# Patient Record
Sex: Female | Born: 1945 | Race: White | Hispanic: No | Marital: Married | Smoking: Never smoker
Health system: Southern US, Community
[De-identification: ages and names within clinical notes are randomized; demographics above are authoritative.]

## PROBLEM LIST (undated history)

## (undated) DIAGNOSIS — E785 Hyperlipidemia, unspecified: Secondary | ICD-10-CM

## (undated) DIAGNOSIS — M1990 Unspecified osteoarthritis, unspecified site: Secondary | ICD-10-CM

## (undated) DIAGNOSIS — F32A Depression, unspecified: Secondary | ICD-10-CM

## (undated) DIAGNOSIS — I482 Chronic atrial fibrillation, unspecified: Secondary | ICD-10-CM

## (undated) DIAGNOSIS — I340 Nonrheumatic mitral (valve) insufficiency: Secondary | ICD-10-CM

## (undated) DIAGNOSIS — E119 Type 2 diabetes mellitus without complications: Secondary | ICD-10-CM

## (undated) DIAGNOSIS — I10 Essential (primary) hypertension: Secondary | ICD-10-CM

## (undated) DIAGNOSIS — F329 Major depressive disorder, single episode, unspecified: Secondary | ICD-10-CM

## (undated) HISTORY — DX: Type 2 diabetes mellitus without complications: E11.9

## (undated) HISTORY — DX: Chronic atrial fibrillation, unspecified: I48.20

## (undated) HISTORY — PX: TONSILLECTOMY: SUR1361

## (undated) HISTORY — PX: TUBAL LIGATION: SHX77

## (undated) HISTORY — PX: COLONOSCOPY: SHX174

## (undated) HISTORY — DX: Nonrheumatic mitral (valve) insufficiency: I34.0

---

## 2002-07-31 ENCOUNTER — Encounter: Payer: Self-pay | Admitting: Family Medicine

## 2002-07-31 ENCOUNTER — Encounter: Admission: RE | Admit: 2002-07-31 | Discharge: 2002-07-31 | Payer: Self-pay | Admitting: Family Medicine

## 2002-08-03 ENCOUNTER — Encounter: Admission: RE | Admit: 2002-08-03 | Discharge: 2002-08-03 | Payer: Self-pay | Admitting: Family Medicine

## 2002-08-03 ENCOUNTER — Encounter: Payer: Self-pay | Admitting: Family Medicine

## 2004-11-24 ENCOUNTER — Encounter: Admission: RE | Admit: 2004-11-24 | Discharge: 2004-11-24 | Payer: Self-pay | Admitting: Family Medicine

## 2004-12-07 ENCOUNTER — Ambulatory Visit (HOSPITAL_COMMUNITY): Admission: RE | Admit: 2004-12-07 | Discharge: 2004-12-07 | Payer: Self-pay | Admitting: Gastroenterology

## 2009-02-22 ENCOUNTER — Encounter: Admission: RE | Admit: 2009-02-22 | Discharge: 2009-02-22 | Payer: Self-pay | Admitting: Family Medicine

## 2009-12-02 ENCOUNTER — Ambulatory Visit: Payer: Self-pay | Admitting: Cardiology

## 2009-12-02 ENCOUNTER — Inpatient Hospital Stay (HOSPITAL_COMMUNITY): Admission: EM | Admit: 2009-12-02 | Discharge: 2009-12-05 | Payer: Self-pay | Admitting: Emergency Medicine

## 2009-12-03 ENCOUNTER — Encounter (INDEPENDENT_AMBULATORY_CARE_PROVIDER_SITE_OTHER): Payer: Self-pay | Admitting: Internal Medicine

## 2009-12-26 ENCOUNTER — Telehealth (INDEPENDENT_AMBULATORY_CARE_PROVIDER_SITE_OTHER): Payer: Self-pay | Admitting: *Deleted

## 2010-01-25 ENCOUNTER — Ambulatory Visit: Payer: Self-pay | Admitting: Cardiology

## 2010-01-25 DIAGNOSIS — I5022 Chronic systolic (congestive) heart failure: Secondary | ICD-10-CM

## 2010-01-25 DIAGNOSIS — I4891 Unspecified atrial fibrillation: Secondary | ICD-10-CM

## 2010-01-25 DIAGNOSIS — E669 Obesity, unspecified: Secondary | ICD-10-CM

## 2010-02-01 ENCOUNTER — Telehealth (INDEPENDENT_AMBULATORY_CARE_PROVIDER_SITE_OTHER): Payer: Self-pay | Admitting: *Deleted

## 2010-02-02 ENCOUNTER — Ambulatory Visit: Payer: Self-pay | Admitting: Cardiovascular Disease

## 2010-02-02 ENCOUNTER — Ambulatory Visit: Payer: Self-pay

## 2010-02-02 ENCOUNTER — Encounter (HOSPITAL_COMMUNITY): Admission: RE | Admit: 2010-02-02 | Discharge: 2010-02-14 | Payer: Self-pay | Admitting: Cardiology

## 2010-02-02 ENCOUNTER — Encounter: Payer: Self-pay | Admitting: Cardiovascular Disease

## 2010-02-06 ENCOUNTER — Ambulatory Visit: Payer: Self-pay

## 2010-06-27 NOTE — Progress Notes (Signed)
Summary: Nuc Pre-Procedure  Phone Note Outgoing Call Call back at [facility] Phone [phone]   Call placed by: Antionette Char RN,  February 01, 2010 2:48 PM Call placed to: Patient Reason for Call: Confirm/change Appt Summary of Call: Reviewed information on Myoview Information Sheet (see scanned document for further details).  Spoke with patient.     Nuclear Med Background Indications for Stress Test: Evaluation for Ischemia   History: Echo  History Comments: New Onset of Afib 7/11  Symptoms: SOB    Nuclear Pre-Procedure Cardiac Risk Factors: Lipids Height (in): 63

## 2010-06-27 NOTE — Assessment & Plan Note (Signed)
Summary: eph   Visit Type:  Follow-up Primary Provider:  Kathee Delton, PAc  CC:  Atrial Fibrillation.  History of Present Illness: The patient presents for evaluation of atrial fibrillation. She was hospitalized with this recently. She said she saw her primary physician because she hadn't been feeling well. However, she didn't describe specifically palpitations, presyncope or syncope. She was noted to be in atrial fibrillation and was admitted to the hospital. I reviewed these records. She was started on anticoagulation and managed for rate control. She had an echocardiogram which demonstrated some mild pulmonary hypertension, left atrial enlargement and global hypokinesis with an EF of 45%. She was set to see [place] in followup as a new patient.  She is limited in her activities. She has Parkinson's disease and gets around with a walker. With this level of activity she does not feel palpitations, presyncope or syncope. She does not describe chest pressure, neck or arm discomfort. She has some shortness of breath but no PND or orthopnea. She's had no swelling.  Current Medications (verified): 1)  Digoxin 0.25 Mg Tabs (Digoxin) .Marland Kitchen.. 1 By Mouth Daily 2)  Metoprolol Tartrate 25 Mg Tabs (Metoprolol Tartrate) .... 1/2 By Mouth Two Times A Day 3)  Warfarin Sodium 7.5 Mg Tabs (Warfarin Sodium) .... As Directed 4)  Alprazolam 0.25 Mg Tabs (Alprazolam) .... As Needed 5)  Carbidopa-Levodopa 25-100 Mg Tabs (Carbidopa-Levodopa) .Marland Kitchen.. 1 By Mouth Three Times A Day 6)  Fluoxetine Hcl 40 Mg Caps (Fluoxetine Hcl) .Marland Kitchen.. 1 By Mouth Daily 7)  Ropinirole Hcl 2 Mg Tabs (Ropinirole Hcl) .Marland Kitchen.. 1 By Mouth Three Times A Day 8)  Simvastatin 40 Mg Tabs (Simvastatin) .Marland Kitchen.. 1 By Mouth Daily 9)  Trihexyphenidyl .Marland Kitchen.. 1 By Mouth Two Times A Day  Allergies (verified): No Known Drug Allergies  Past History:  Past Medical History: Reviewed history from 01/23/2010 and no changes required.  1. Parkinson's disease.   2. Anxiety  with panic attacks occasionally.   3. Dyslipidemia.   Past Surgical History: Reviewed history from 01/23/2010 and no changes required. None  Family History: No significant family history of heart disease or  stroke or cancer.   Social History: Non-smoker, non-drinker, no drug abuse  Review of Systems       As stated in the HPI and negative for all other systems.   Vital Signs:  Patient profile:   65 year old female Height:      63 inches Weight:      240 pounds BMI:     42.67 Pulse rate:   88 / minute Resp:     18 per minute  Vitals Entered By: Marrion Coy, CNA (January 25, 2010 3:58 PM)  Physical Exam  General:  Well developed, well nourished, in no acute distress. Head:  normocephalic and atraumatic Eyes:  PERRLA/EOM intact; conjunctiva and lids normal. Mouth:  Teeth, gums and palate normal. Oral mucosa normal. Neck:  No jugular venous distention at 90, carotid upstroke brisk and symmetric, no bruits, no thyromegaly Chest Wall:  no deformities or breast masses noted Lungs:  Clear bilaterally to auscultation and percussion. Abdomen:  Positive bowel sounds, no rebound, no guarding, morbidly obese, no organomegaly or midline pulsatile mass appreciated as the patient was seated Msk:  Back normal, normal gait. Muscle strength and tone normal. Extremities:  No clubbing or cyanosis. Neurologic:  Alert and oriented x 3. Skin:  Intact without lesions or rashes. Cervical Nodes:  no significant adenopathy Psych:  Normal affect.   Detailed Cardiovascular Exam  Neck    Carotids: Carotids full and equal bilaterally without bruits.    Heart    Auscultation: S1 and S2 within normal limits, no S3, no clicks, no rubs, no murmurs, PMI not appreciated as the patient was seated  Vascular    Pedal Pulses: normal pedal pulses bilaterally.      Peripheral Circulation: no clubbing, cyanosis, or edema noted with normal capillary refill.     EKG  Procedure date:   01/25/2010  Findings:      Atrial fibrillation, rate 88, rightward axis, no acute ST-T wave changes  Impression & Recommendations:  Problem # 1:  ATRIAL FIBRILLATION WITH RAPID VENTRICULAR RESPONSE (ICD-427.31) She is tolerating anticoagulation. She has is followed by her primary physician. At this point I would like to try to get her in normal rhythm with cardioversion once I document therapeutic Coumadin levels. She will be educated about atrial fibrillation and given information.  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 This may be related to her rhythm. I will check a stress perfusion study. She would not be a walk so she will need a pharmacologic stress. Orders: EKG w/ Interpretation (93000) Nuclear Stress Test (Nuc Stress Test)  Problem # 3:  OBESITY, UNSPECIFIED (ICD-278.00) Weight loss is difficult given her poor mobility. I will continue to discuss calorie restriction.  Patient Instructions: 1)  Your physician recommends that you schedule a follow-up appointment after myoview - we will set up your cardioversion 2)  Your physician recommends that you continue on your current medications as directed. Please refer to the Current Medication list given to you today. 3)  Your physician has requested that you have an adenosine myoview.  For further information please visit [email or web address]  Please follow instruction sheet, as given.

## 2010-06-27 NOTE — Assessment & Plan Note (Signed)
Summary: Cardiology Nuclear Testing  Nuclear Med Background Indications for Stress Test: Evaluation for Ischemia   History: Echo  History Comments: New Onset of Afib 7/11  Symptoms: Chest Pressure, DOE, Palpitations, Rapid HR, SOB  Symptoms Comments: Last Episode of CP- July 2011   Nuclear Pre-Procedure Cardiac Risk Factors: Hypertension, Lipids Caffeine/Decaff Intake: none NPO After: 1:30 PM Lungs: clear IV 0.9% NS with Angio Cath: 22g     IV Site: R Antecubital IV Started by: Bonnita Levan, RN Chest Size (in) 42     Cup Size D     Height (in): 63 Weight (lb): 240 BMI: 42.67  Nuclear Med Study 1 or 2 day study:  2 day     Stress Test Type:  Treadmill/Lexiscan Reading MD:  Olga Millers, MD     Referring MD:  Rollene Rotunda  MD Resting Radionuclide:  Technetium 24m Tetrofosmin     Resting Radionuclide Dose:  33 mCi  Stress Radionuclide:  Technetium 34m Tetrofosmin     Stress Radionuclide Dose:  33 mCi   Stress Protocol Exercise Time (min):  2:00 min     Max HR:  136 bpm     Predicted Max HR:  155 bpm  Max Systolic BP: 148 mm Hg     Percent Max HR:  87.74 %Rate Pressure Product:  96045  Lexiscan: 0.4 mg   Stress Test Technologist:  Bonnita Levan, RN     Nuclear Technologist:  Doyne Keel, CNMT  Rest Procedure  Myocardial perfusion imaging was performed at rest 45 minutes following the intravenous administration of Technetium 40m Tetrofosmin.  Stress Procedure  The patient received IV Lexiscan 0.4 mg over 15-seconds with concurrent low level exercise and then Technetium 34m Tetrofosmin was injected at 30-seconds while the patient continued walking one more minute.  There were no significant changes with Lexiscan. Occ. PVC noted.  Quantitative spect images were obtained after a 45 minute delay.  QPS Raw Data Images:  Normal; no motion artifact; normal heart/lung ratio. Stress Images:  Normal homogeneous uptake in all areas of the myocardium. Rest Images:  Normal  homogeneous uptake in all areas of the myocardium. Subtraction (SDS):  Normal Transient Ischemic Dilatation:  0.76  (Normal <1.22)  Lung/Heart Ratio:  0.33  (Normal <0.45)  Quantitative Gated Spect Images QGS EDV:  87 ml QGS ESV:  31 ml QGS EF:  64 % QGS cine images:  normal  Findings Normal nuclear study      Overall Impression  Exercise Capacity: Lexiscan with no exercise. BP Response: Normal blood pressure response. Clinical Symptoms: No chest pain ECG Impression: Baseline afib with nonspecfic ST/T wave changes Overall Impression: Normal stress nuclear study.  Appended Document: Cardiology Nuclear   No evidence of ischemia.  EF is actually normal.  Appended Document: Cardiology Nuclear Testing pt aware of results

## 2010-06-27 NOTE — Progress Notes (Signed)
Summary: Coumadin follow-up  Phone Note Outgoing Call   Call placed by: Cloyde Reams RN,  December 26, 2009 9:39 AM Call placed to: Patient Summary of Call: Called pt secondary to missed new pt appt on 12/12/09 in [facility].  Pt states she is having her Coumadin monitored at her primary MD, Dr Andrey Campanile at [facility] in [place].   Initial call taken by: Cloyde Reams RN,  December 14, 2009 9:41 AM

## 2010-08-13 LAB — BASIC METABOLIC PANEL
CO2: 24 mEq/L (ref 19–32)
Calcium: 8.5 mg/dL (ref 8.4–10.5)
Calcium: 8.9 mg/dL (ref 8.4–10.5)
Creatinine, Ser: 0.72 mg/dL (ref 0.4–1.2)
Creatinine, Ser: 0.94 mg/dL (ref 0.4–1.2)
GFR calc Af Amer: >60 mL/min (ref >60)
GFR calc Af Amer: >60 mL/min (ref >60)
GFR calc non Af Amer: 60 mL/min — ABNORMAL LOW (ref >60)
GFR calc non Af Amer: >60 mL/min (ref >60)
Sodium: 133 mEq/L — ABNORMAL LOW (ref 135–145)

## 2010-08-13 LAB — CARDIAC PANEL(CRET KIN+CKTOT+MB+TROPI)
CK, MB: 3.3 ng/mL (ref 0.3–4.0)
Total CK: 255 U/L — ABNORMAL HIGH (ref 7–177)

## 2010-08-13 LAB — PROTIME-INR
INR: 1.08 (ref 0.00–1.49)
INR: 1.16 (ref 0.00–1.49)
INR: 1.24 (ref 0.00–1.49)
Prothrombin Time: 13.9 seconds (ref 11.6–15.2)
Prothrombin Time: 14.7 seconds (ref 11.6–15.2)
Prothrombin Time: 16.3 seconds — ABNORMAL HIGH (ref 11.6–15.2)

## 2010-08-13 LAB — POCT I-STAT, CHEM 8
Calcium, Ion: 1.03 mmol/L — ABNORMAL LOW (ref 1.12–1.32)
Glucose, Bld: 125 mg/dL — ABNORMAL HIGH (ref 70–99)
HCT: 43 % (ref 36.0–46.0)
Hemoglobin: 14.6 g/dL (ref 12.0–15.0)
Potassium: 3.3 mEq/L — ABNORMAL LOW (ref 3.5–5.1)

## 2010-08-13 LAB — DIFFERENTIAL
Eosinophils Absolute: 0 10*3/uL (ref 0.0–0.7)
Lymphocytes Relative: 17 % (ref 12–46)
Lymphs Abs: 1.5 10*3/uL (ref 0.7–4.0)
Neutrophils Relative %: 76 % (ref 43–77)

## 2010-08-13 LAB — CBC
HCT: 39.6 % (ref 36.0–46.0)
MCH: 32.3 pg (ref 26.0–34.0)
MCV: 97.1 fL (ref 78.0–100.0)
Platelets: 217 10*3/uL (ref 150–400)
Platelets: 226 10*3/uL (ref 150–400)
RBC: 4.08 MIL/uL (ref 3.87–5.11)
RBC: 4.3 MIL/uL (ref 3.87–5.11)
WBC: 7.8 10*3/uL (ref 4.0–10.5)
WBC: 8.9 10*3/uL (ref 4.0–10.5)

## 2010-08-13 LAB — DIGOXIN LEVEL: Digoxin Level: 0.3 ng/mL — ABNORMAL LOW (ref 0.8–2.0)

## 2010-08-13 LAB — MAGNESIUM: Magnesium: 2.2 mg/dL (ref 1.5–2.5)

## 2010-08-13 LAB — LIPID PANEL
Total CHOL/HDL Ratio: 2.7 RATIO
VLDL: 16 mg/dL (ref 0–40)

## 2010-08-13 LAB — URINALYSIS, ROUTINE W REFLEX MICROSCOPIC
Glucose, UA: NEGATIVE mg/dL
Hgb urine dipstick: NEGATIVE
Specific Gravity, Urine: 1.006 (ref 1.005–1.030)
Urobilinogen, UA: 0.2 mg/dL (ref 0.0–1.0)

## 2010-08-13 LAB — HEMOGLOBIN A1C
Hgb A1c MFr Bld: 5.8 % — ABNORMAL HIGH (ref <5.7)
Mean Plasma Glucose: 120 mg/dL — ABNORMAL HIGH (ref <117)

## 2010-08-13 LAB — TROPONIN I: Troponin I: 0.01 ng/mL (ref 0.00–0.06)

## 2010-08-13 LAB — CK TOTAL AND CKMB (NOT AT ARMC): Relative Index: 1.2 (ref 0.0–2.5)

## 2010-08-13 LAB — POCT CARDIAC MARKERS: CKMB, poc: 1.6 ng/mL (ref 1.0–8.0)

## 2011-08-13 ENCOUNTER — Emergency Department (HOSPITAL_COMMUNITY): Payer: Medicare Other

## 2011-08-13 ENCOUNTER — Other Ambulatory Visit: Payer: Self-pay

## 2011-08-13 ENCOUNTER — Inpatient Hospital Stay (HOSPITAL_COMMUNITY)
Admission: AD | Admit: 2011-08-13 | Discharge: 2011-08-16 | DRG: 690 | Disposition: A | Discharge status: Home Health | Payer: Medicare Other | Attending: Family Medicine | Admitting: Family Medicine

## 2011-08-13 ENCOUNTER — Encounter (HOSPITAL_COMMUNITY): Payer: Self-pay | Admitting: Internal Medicine

## 2011-08-13 DIAGNOSIS — F329 Major depressive disorder, single episode, unspecified: Secondary | ICD-10-CM | POA: Diagnosis present

## 2011-08-13 DIAGNOSIS — F3289 Other specified depressive episodes: Secondary | ICD-10-CM | POA: Diagnosis present

## 2011-08-13 DIAGNOSIS — G20A1 Parkinson's disease without dyskinesia, without mention of fluctuations: Secondary | ICD-10-CM

## 2011-08-13 DIAGNOSIS — R5381 Other malaise: Secondary | ICD-10-CM | POA: Diagnosis present

## 2011-08-13 DIAGNOSIS — R531 Weakness: Secondary | ICD-10-CM

## 2011-08-13 DIAGNOSIS — R32 Unspecified urinary incontinence: Secondary | ICD-10-CM | POA: Diagnosis present

## 2011-08-13 DIAGNOSIS — I482 Chronic atrial fibrillation, unspecified: Secondary | ICD-10-CM

## 2011-08-13 DIAGNOSIS — G20 Parkinson's disease: Secondary | ICD-10-CM

## 2011-08-13 DIAGNOSIS — E876 Hypokalemia: Secondary | ICD-10-CM | POA: Diagnosis present

## 2011-08-13 DIAGNOSIS — I4891 Unspecified atrial fibrillation: Secondary | ICD-10-CM | POA: Diagnosis present

## 2011-08-13 DIAGNOSIS — N390 Urinary tract infection, site not specified: Principal | ICD-10-CM

## 2011-08-13 DIAGNOSIS — D6832 Hemorrhagic disorder due to extrinsic circulating anticoagulants: Secondary | ICD-10-CM

## 2011-08-13 HISTORY — DX: Essential (primary) hypertension: I10

## 2011-08-13 HISTORY — DX: Hyperlipidemia, unspecified: E78.5

## 2011-08-13 HISTORY — DX: Unspecified osteoarthritis, unspecified site: M19.90

## 2011-08-13 LAB — URINALYSIS, ROUTINE W REFLEX MICROSCOPIC
Nitrite: POSITIVE — AB
Specific Gravity, Urine: 1.029 (ref 1.005–1.030)
Urobilinogen, UA: 1 mg/dL (ref 0.0–1.0)

## 2011-08-13 LAB — DIFFERENTIAL
Eosinophils Absolute: 0.1 10*3/uL (ref 0.0–0.7)
Eosinophils Relative: 1 % (ref 0–5)
Lymphocytes Relative: 24 % (ref 12–46)
Lymphs Abs: 2.2 10*3/uL (ref 0.7–4.0)
Monocytes Absolute: 0.9 10*3/uL (ref 0.1–1.0)
Monocytes Relative: 9 % (ref 3–12)

## 2011-08-13 LAB — CBC
HCT: 38 % (ref 36.0–46.0)
Hemoglobin: 13 g/dL (ref 12.0–15.0)
MCH: 31.3 pg (ref 26.0–34.0)
MCV: 91.6 fL (ref 78.0–100.0)
RBC: 4.15 MIL/uL (ref 3.87–5.11)
WBC: 9.2 10*3/uL (ref 4.0–10.5)

## 2011-08-13 LAB — URINE MICROSCOPIC-ADD ON

## 2011-08-13 LAB — COMPREHENSIVE METABOLIC PANEL
ALT: 16 U/L (ref 0–35)
BUN: 20 mg/dL (ref 6–23)
CO2: 24 mEq/L (ref 19–32)
Calcium: 9.6 mg/dL (ref 8.4–10.5)
GFR calc Af Amer: >90 mL/min (ref >90)
GFR calc non Af Amer: 85 mL/min — ABNORMAL LOW (ref >90)
Glucose, Bld: 100 mg/dL — ABNORMAL HIGH (ref 70–99)
Total Protein: 7.1 g/dL (ref 6.0–8.3)

## 2011-08-13 LAB — APTT: aPTT: 47 seconds — ABNORMAL HIGH (ref 24–37)

## 2011-08-13 LAB — PROTIME-INR: INR: 3.49 — ABNORMAL HIGH (ref 0.00–1.49)

## 2011-08-13 MED ORDER — METOPROLOL TARTRATE 25 MG PO TABS
12.5000 mg | ORAL_TABLET | Freq: Once | ORAL | Status: AC
  Administered 2011-08-13: 12.5 mg via ORAL

## 2011-08-13 MED ORDER — FLUOXETINE HCL 20 MG PO CAPS
40.0000 mg | ORAL_CAPSULE | Freq: Every day | ORAL | Status: DC
  Administered 2011-08-14 – 2011-08-16 (×3): 40 mg via ORAL
  Filled 2011-08-13 (×3): qty 2

## 2011-08-13 MED ORDER — DEXTROSE 5 % IV SOLN
1.0000 g | INTRAVENOUS | Status: DC
  Administered 2011-08-14 – 2011-08-15 (×3): 1 g via INTRAVENOUS
  Filled 2011-08-13 (×4): qty 10

## 2011-08-13 MED ORDER — POTASSIUM CHLORIDE CRYS ER 20 MEQ PO TBCR
20.0000 meq | EXTENDED_RELEASE_TABLET | Freq: Once | ORAL | Status: AC
  Administered 2011-08-13: 20 meq via ORAL
  Filled 2011-08-13: qty 1

## 2011-08-13 MED ORDER — SODIUM CHLORIDE 0.9 % IJ SOLN
3.0000 mL | INTRAMUSCULAR | Status: DC | PRN
  Administered 2011-08-16: 3 mL via INTRAVENOUS

## 2011-08-13 MED ORDER — ROPINIROLE HCL 1 MG PO TABS
2.0000 mg | ORAL_TABLET | Freq: Every day | ORAL | Status: DC
  Administered 2011-08-14 – 2011-08-15 (×2): 2 mg via ORAL
  Filled 2011-08-13 (×3): qty 2

## 2011-08-13 MED ORDER — SIMVASTATIN 40 MG PO TABS
40.0000 mg | ORAL_TABLET | Freq: Every day | ORAL | Status: DC
  Administered 2011-08-14 – 2011-08-15 (×2): 40 mg via ORAL
  Filled 2011-08-13 (×3): qty 1

## 2011-08-13 MED ORDER — SODIUM CHLORIDE 0.9 % IV SOLN: 250.0000 mL | INTRAVENOUS | Status: DC | PRN

## 2011-08-13 MED ORDER — SODIUM CHLORIDE 0.9 % IV SOLN
INTRAVENOUS | Status: DC
  Administered 2011-08-14 – 2011-08-15 (×3): via INTRAVENOUS

## 2011-08-13 MED ORDER — DEXTROSE 5 % IV SOLN
1.0000 g | Freq: Once | INTRAVENOUS | Status: AC
  Administered 2011-08-13: 1 g via INTRAVENOUS
  Filled 2011-08-13: qty 10

## 2011-08-13 MED ORDER — METOPROLOL TARTRATE 12.5 MG HALF TABLET
12.5000 mg | ORAL_TABLET | Freq: Two times a day (BID) | ORAL | Status: DC
  Administered 2011-08-14 – 2011-08-16 (×4): 12.5 mg via ORAL
  Filled 2011-08-13 (×7): qty 1

## 2011-08-13 MED ORDER — SODIUM CHLORIDE 0.9 % IJ SOLN
3.0000 mL | Freq: Two times a day (BID) | INTRAMUSCULAR | Status: DC
  Administered 2011-08-15: 3 mL via INTRAVENOUS

## 2011-08-13 MED ORDER — DIGOXIN 250 MCG PO TABS
250.0000 ug | ORAL_TABLET | Freq: Every day | ORAL | Status: DC
  Administered 2011-08-14 – 2011-08-15 (×2): 250 ug via ORAL
  Filled 2011-08-13 (×4): qty 1

## 2011-08-13 MED ORDER — ACETAMINOPHEN 500 MG PO TABS
1000.0000 mg | ORAL_TABLET | Freq: Once | ORAL | Status: AC
  Administered 2011-08-13: 1000 mg via ORAL
  Filled 2011-08-13: qty 2

## 2011-08-13 MED ORDER — DIGOXIN 125 MCG PO TABS
0.1250 mg | ORAL_TABLET | Freq: Once | ORAL | Status: AC
  Administered 2011-08-14: 0.125 mg via ORAL
  Filled 2011-08-13 (×2): qty 1

## 2011-08-13 MED ORDER — CARBIDOPA-LEVODOPA 25-100 MG PO TABS
1.0000 | ORAL_TABLET | Freq: Three times a day (TID) | ORAL | Status: DC
  Administered 2011-08-14 – 2011-08-16 (×7): 1 via ORAL
  Filled 2011-08-13 (×12): qty 1

## 2011-08-13 MED ORDER — ALPRAZOLAM 0.25 MG PO TABS: 0.2500 mg | ORAL_TABLET | Freq: Three times a day (TID) | ORAL | Status: DC | PRN

## 2011-08-13 MED ORDER — SODIUM CHLORIDE 0.9 % IJ SOLN
3.0000 mL | Freq: Two times a day (BID) | INTRAMUSCULAR | Status: DC
  Administered 2011-08-14: 3 mL via INTRAVENOUS

## 2011-08-13 MED ORDER — TRIHEXYPHENIDYL HCL 2 MG PO TABS
2.0000 mg | ORAL_TABLET | Freq: Two times a day (BID) | ORAL | Status: DC
  Administered 2011-08-14 – 2011-08-16 (×5): 2 mg via ORAL
  Filled 2011-08-13 (×6): qty 1

## 2011-08-13 MED ORDER — FESOTERODINE FUMARATE ER 8 MG PO TB24
8.0000 mg | ORAL_TABLET | Freq: Every day | ORAL | Status: DC
Start: 2011-08-14 — End: 2011-08-16
  Administered 2011-08-14 – 2011-08-16 (×3): 8 mg via ORAL
  Filled 2011-08-13 (×3): qty 1

## 2011-08-13 MED ORDER — CARBIDOPA-LEVODOPA 25-100 MG PO TABS
1.0000 | ORAL_TABLET | Freq: Once | ORAL | Status: AC
  Administered 2011-08-14: 1 via ORAL
  Filled 2011-08-13 (×2): qty 1

## 2011-08-13 NOTE — ED Notes (Signed)
Admitting MD at bedside.

## 2011-08-13 NOTE — Progress Notes (Signed)
ANTICOAGULATION CONSULT NOTE - Initial Consult  Pharmacy Consult for Warfarin Indication: atrial fibrillation  No Known Allergies  Patient Measurements: Ht:     Wt:     Vital Signs: Temp: 100.3 F (37.9 C) (03/18 2018) Temp src: Oral (03/18 2018) BP: 127/78 mmHg (03/18 2018) Pulse Rate: 107  (03/18 2018)  Labs:  [facility] 08/13/11 1832  HGB 13.0  HCT 38.0  PLT 259  APTT 47*  LABPROT 35.6*  INR 3.49*  HEPARINUNFRC --  CREATININE 0.78  CKTOTAL --  CKMB --  TROPONINI --   The CrCl is unknown because both a height and weight (above a minimum accepted value) are required for this calculation.  Medical History: Past Medical History  Diagnosis Date  . Parkinson's disease     dx circa 1990 by Dr. Thad Ranger  . Hyperlipidemia     dx circa 2008  . Hypertension     dx circa 2008  . Osteoarthritis     L knee primarily    Medications:  Scheduled:    . acetaminophen  1,000 mg Oral Once  . carbidopa-levodopa  1 tablet Oral TID  . carbidopa-levodopa  1 tablet Oral Once  . cefTRIAXone (ROCEPHIN)  IV  1 g Intravenous Once  . cefTRIAXone (ROCEPHIN)  IV  1 g Intravenous Q24H  . digoxin  0.125 mg Oral Once  . digoxin  250 mcg Oral Daily  . Fesoterodine Fumarate  8 mg Oral Daily  . FLUoxetine  40 mg Oral Daily  . metoprolol tartrate  12.5 mg Oral BID  . metoprolol tartrate  12.5 mg Oral Once  . potassium chloride  20 mEq Oral Once  . rOPINIRole  2 mg Oral QHS  . simvastatin  40 mg Oral QPC supper  . sodium chloride  3 mL Intravenous Q12H  . sodium chloride  3 mL Intravenous Q12H  . trihexyphenidyl  2 mg Oral BID WC   Infusions:    . sodium chloride      Assessment: 66 yo female admitted with UTI- pt on chronic coumadin for A-fib  Goal of Therapy:  INR 2-3   Plan:   No Coumadin tonight.  INR = 3.49  Daily PT/INR  Education  Lorenza Evangelist 08/13/2011,11:52 PM

## 2011-08-13 NOTE — ED Provider Notes (Cosign Needed)
History     CSN: [id number]  Arrival date & time 08/13/11  1512   First MD Initiated Contact with Patient 08/13/11 1558      Chief Complaint  Patient presents with  . Weakness    (Consider location/radiation/quality/duration/timing/severity/associated sxs/prior treatment) HPI  Patient relates she ran out of her levodopa for her Parkinson's disease about 2 weeks ago. She relates her neurologist moved and she has an appointment with her new neurologist Dr. Anne Hahn on March 27. She relates "I don't feel good" patient has extreme difficulty relating to me what that means. She has nausea without vomiting. She states she feels shaky. She relates that got worse last week. She states she doesn't feel like doing anything and she is sleeping a lot. She states she has dizziness and dry mouth and relates that to her medications but she's been off her medicines for a long time. She denies diarrhea but states her stools are normally soft. She has been having urinary and rectal incontinence for several months and has seen Dr. Brunilda Payor twice. Her next appointment with him is April 8. She has been on Tovias 4 mg for a month and is now on 8 mg for total of 3 months of tx on 4/8. She also denies cough, sore throat, rhinorrhea, headache, chest pain, abdominal pain. She states she feels weak in her legs.  PCP Karmen Stabs PA at [facility] under Dr. Benedetto Goad Neurologist Dr. Anne Hahn  No past medical history on file. Parkinson's High cholesterol Anxiety Atrial fibrillation  No past surgical history on file.  No family history on file.  History  Substance Use Topics  . Smoking status: No  . Smokeless tobacco: Not on file  . Alcohol Use: No   lives with husband and Normally walks unassisted  OB History    No data available      Review of Systems  All other systems reviewed and are negative.    Allergies  Review of patient's allergies indicates no known allergies.  Home  Medications   Current Outpatient Rx  Name Route Sig Dispense Refill  . ALPRAZOLAM 0.25 MG PO TABS Oral Take 0.25 mg by mouth 3 (three) times daily as needed. For anxiety    . CARBIDOPA-LEVODOPA 25-100 MG PO TABS Oral Take 1 tablet by mouth 3 (three) times daily.    Marland Kitchen DIGOXIN 0.25 MG PO TABS Oral Take 250 mcg by mouth daily.    . FESOTERODINE FUMARATE ER 8 MG PO TB24 Oral Take 8 mg by mouth daily.    Marland Kitchen FLUOXETINE HCL 40 MG PO CAPS Oral Take 40 mg by mouth daily.    Marland Kitchen METOPROLOL TARTRATE 25 MG PO TABS Oral Take 12.5 mg by mouth 2 (two) times daily.    . OXYBUTYNIN CHLORIDE ER 5 MG PO TB24 Oral Take 5 mg by mouth daily.    Marland Kitchen ROPINIROLE HCL 2 MG PO TABS Oral Take 2 mg by mouth at bedtime.    Marland Kitchen SIMVASTATIN 40 MG PO TABS Oral Take 40 mg by mouth every evening.    . TRIHEXYPHENIDYL HCL 2 MG PO TABS Oral Take 2 mg by mouth 2 (two) times daily with a meal.    . WARFARIN SODIUM 5 MG PO TABS Oral Take 5-7.5 mg by mouth daily. Take 1 (5 mg) tablet by mouth on Monday, Wednesday,and Friday and 7.5 mg on remaining days of week      BP 158/47  Pulse 112  Temp(Src) 99.1 F (37.3 C) (  Oral)  Resp 25  SpO2 98%  Vital signs normal tachycardia, low grade fever   Physical Exam  Nursing note and vitals reviewed. Constitutional: She is oriented to person, place, and time. She appears well-developed and well-nourished.  Non-toxic appearance. She does not appear ill. No distress.  HENT:  Head: Normocephalic and atraumatic.  Right Ear: External ear normal.  Left Ear: External ear normal.  Nose: Nose normal. No mucosal edema or rhinorrhea.  Mouth/Throat: Mucous membranes are normal. No dental abscesses or uvula swelling.       Tongue dry  Eyes: Conjunctivae and EOM are normal. Pupils are equal, round, and reactive to light.  Neck: Normal range of motion and full passive range of motion without pain. Neck supple.  Cardiovascular: Normal rate, regular rhythm and normal heart sounds.  Exam reveals no gallop  and no friction rub.   No murmur heard. Pulmonary/Chest: Effort normal and breath sounds normal. No respiratory distress. She has no wheezes. She has no rhonchi. She has no rales. She exhibits no tenderness and no crepitus.  Abdominal: Soft. Normal appearance and bowel sounds are normal. She exhibits no distension. There is no tenderness. There is no rebound and no guarding.  Musculoskeletal: Normal range of motion. She exhibits no edema and no tenderness.       Moves all extremities well. Patient's noted to have tremor at times. She has some mild diffuse redness and scaliness of her lower legs. She denies any pain.  Neurological: She is alert and oriented to person, place, and time. She has normal strength. No cranial nerve deficit.  Skin: Skin is warm, dry and intact. No rash noted. No erythema. No pallor.       Patient feels very warm to touch all over.  Psychiatric: She has a normal mood and affect. Her speech is normal and behavior is normal. Her mood appears not anxious.    ED Course  Procedures (including critical care time)  Pt attempted to walk, had difficulty standing up with walker. Pt developed low grade fever while in the ED.   Medications  cefTRIAXone (ROCEPHIN) 1 g in dextrose 5 % 50 mL IVPB (1 g Intravenous Given 08/13/11 2118)  acetaminophen (TYLENOL) tablet 1,000 mg (1000 mg Oral Given 08/13/11 2118)   22:12 Dr Selena Batten, admit to tele, team 5 Dr Sunnie Nielsen   Results for orders placed during the hospital encounter of 08/13/11  URINALYSIS, ROUTINE W REFLEX MICROSCOPIC      Component Value Range   Color, Urine AMBER (*) YELLOW    APPearance TURBID (*) CLEAR    Specific Gravity, Urine 1.029  1.005 - 1.030    pH 5.5  5.0 - 8.0    Glucose, UA NEGATIVE  NEGATIVE (mg/dL)   Hgb urine dipstick LARGE (*) NEGATIVE    Bilirubin Urine SMALL (*) NEGATIVE    Ketones, ur TRACE (*) NEGATIVE (mg/dL)   Protein, ur 30 (*) NEGATIVE (mg/dL)   Urobilinogen, UA 1.0  0.0 - 1.0 (mg/dL)   Nitrite  POSITIVE (*) NEGATIVE    Leukocytes, UA LARGE (*) NEGATIVE   CBC      Component Value Range   WBC 9.2  4.0 - 10.5 (K/uL)   RBC 4.15  3.87 - 5.11 (MIL/uL)   Hemoglobin 13.0  12.0 - 15.0 (g/dL)   HCT 16.1  09.6 - 04.5 (%)   MCV 91.6  78.0 - 100.0 (fL)   MCH 31.3  26.0 - 34.0 (pg)   MCHC 34.2  30.0 - 36.0 (  g/dL)   RDW 19.1  47.8 - 29.5 (%)   Platelets 259  150 - 400 (K/uL)  DIFFERENTIAL      Component Value Range   Neutrophils Relative 66  43 - 77 (%)   Neutro Abs 6.1  1.7 - 7.7 (K/uL)   Lymphocytes Relative 24  12 - 46 (%)   Lymphs Abs 2.2  0.7 - 4.0 (K/uL)   Monocytes Relative 9  3 - 12 (%)   Monocytes Absolute 0.9  0.1 - 1.0 (K/uL)   Eosinophils Relative 1  0 - 5 (%)   Eosinophils Absolute 0.1  0.0 - 0.7 (K/uL)   Basophils Relative 0  0 - 1 (%)   Basophils Absolute 0.0  0.0 - 0.1 (K/uL)  COMPREHENSIVE METABOLIC PANEL      Component Value Range   Sodium 136  135 - 145 (mEq/L)   Potassium 3.4 (*) 3.5 - 5.1 (mEq/L)   Chloride 99  96 - 112 (mEq/L)   CO2 24  19 - 32 (mEq/L)   Glucose, Bld 100 (*) 70 - 99 (mg/dL)   BUN 20  6 - 23 (mg/dL)   Creatinine, Ser 6.21  0.50 - 1.10 (mg/dL)   Calcium 9.6  8.4 - 30.8 (mg/dL)   Total Protein 7.1  6.0 - 8.3 (g/dL)   Albumin 3.6  3.5 - 5.2 (g/dL)   AST 23  0 - 37 (U/L)   ALT 16  0 - 35 (U/L)   Alkaline Phosphatase 67  39 - 117 (U/L)   Total Bilirubin 0.9  0.3 - 1.2 (mg/dL)   GFR calc non Af Amer 85 (*) >90 (mL/min)   GFR calc Af Amer >90  >90 (mL/min)  APTT      Component Value Range   aPTT 47 (*) 24 - 37 (seconds)  PROTIME-INR      Component Value Range   Prothrombin Time 35.6 (*) 11.6 - 15.2 (seconds)   INR 3.49 (*) 0.00 - 1.49   URINE MICROSCOPIC-ADD ON      Component Value Range   WBC, UA TOO NUMEROUS TO COUNT  <3 (WBC/hpf)   RBC / HPF 3-6  <3 (RBC/hpf)   Bacteria, UA MANY (*) RARE    Urine-Other MUCOUS PRESENT     Laboratory interpretation all normal except mild hypokalemia, over therapeutic Coumadin, prominent UTI   Date:  08/13/2011  Rate: 116  Rhythm: atrial fibrillation  QRS Axis: normal  Intervals: QT prolonged  ST/T Wave abnormalities: normal  Conduction Disutrbances:none  Narrative Interpretation:   Old EKG Reviewed: changes noted from 12/03/2009 rate faster     1. Weakness generalized   2. Urinary tract infection   3. Parkinson disease   4. Atrial fibrillation, chronic   5. Warfarin-induced coagulopathy    Plan admission  Devoria Albe, MD, FACEP    MDM          Ward Givens, MD 08/13/11 [phone]

## 2011-08-13 NOTE — H&P (Signed)
Christy Pham is an 66 y.o. female.    Cc: Dr. Benedetto Goad (pcp), Dr. Kelli Hope (neurologist), Dr. Lesia Sago, (neurologist), Dr. Su Grand (urologist)  Chief Complaint: didn't feel well HPI: 66 yo female with Parkinsons, hx of Afib with rvr, Hypertension, Hyperlipidemia, felt poorly and so came to ER, by 911 from physicians office for evaluation, noted to be in Afib with rvr, (mild), and also uti. Pt will be admitted for afib with rvr and uti. Denies cp,palp, sob, lower ext edema, orthopnea, pnd.   ER course: pt received rocephin in the ED.  Pt given metoprolol x1 dose since hr about 105 presently.  Cpk, mb, trop, digoxin added to ED labs along with CXR.   Past Medical History  Diagnosis Date  . Parkinson's disease     dx circa 1990 by Dr. Thad Ranger  . Hyperlipidemia     dx circa 2008  . Hypertension     dx circa 2008  . Osteoarthritis     L knee primarily    Past Surgical History  Procedure Date  . Tubal ligation     circa 1983  . Tonsillectomy     circa 1950  . Colonoscopy     2006 by Jeani Hawking (clear per pt)    Family History  Problem Relation Age of Onset  . Coronary artery disease Father 68    s/p CABG  . Colon cancer Father 54    s/p resection  . Dementia Mother   . Restless legs syndrome Father    Social History:  reports that she has never smoked. She has never used smokeless tobacco. She reports that she drinks alcohol. She reports that she does not use illicit drugs.  Allergies: No Known Allergies  Medications Prior to Admission  Medication Dose Route Frequency Provider Last Rate Last Dose  . acetaminophen (TYLENOL) tablet 1,000 mg  1,000 mg Oral Once Ward Givens, MD   1,000 mg at 08/13/11 2118  . cefTRIAXone (ROCEPHIN) 1 g in dextrose 5 % 50 mL IVPB  1 g Intravenous Once Ward Givens, MD   1 g at 08/13/11 2118   No current outpatient prescriptions on file as of 08/13/2011.    Results for orders placed during the hospital encounter of  08/13/11 (from the past 48 hour(s))  CBC     Status: Normal   Collection Time   08/13/11  6:32 PM      Component Value Range Comment   WBC 9.2  4.0 - 10.5 (K/uL)    RBC 4.15  3.87 - 5.11 (MIL/uL)    Hemoglobin 13.0  12.0 - 15.0 (g/dL)    HCT 45.4  09.8 - 11.9 (%)    MCV 91.6  78.0 - 100.0 (fL)    MCH 31.3  26.0 - 34.0 (pg)    MCHC 34.2  30.0 - 36.0 (g/dL)    RDW 14.7  82.9 - 56.2 (%)    Platelets 259  150 - 400 (K/uL)   DIFFERENTIAL     Status: Normal   Collection Time   08/13/11  6:32 PM      Component Value Range Comment   Neutrophils Relative 66  43 - 77 (%)    Neutro Abs 6.1  1.7 - 7.7 (K/uL)    Lymphocytes Relative 24  12 - 46 (%)    Lymphs Abs 2.2  0.7 - 4.0 (K/uL)    Monocytes Relative 9  3 - 12 (%)    Monocytes Absolute  0.9  0.1 - 1.0 (K/uL)    Eosinophils Relative 1  0 - 5 (%)    Eosinophils Absolute 0.1  0.0 - 0.7 (K/uL)    Basophils Relative 0  0 - 1 (%)    Basophils Absolute 0.0  0.0 - 0.1 (K/uL)   COMPREHENSIVE METABOLIC PANEL     Status: Abnormal   Collection Time   08/13/11  6:32 PM      Component Value Range Comment   Sodium 136  135 - 145 (mEq/L)    Potassium 3.4 (*) 3.5 - 5.1 (mEq/L)    Chloride 99  96 - 112 (mEq/L)    CO2 24  19 - 32 (mEq/L)    Glucose, Bld 100 (*) 70 - 99 (mg/dL)    BUN 20  6 - 23 (mg/dL)    Creatinine, Ser 1.61  0.50 - 1.10 (mg/dL)    Calcium 9.6  8.4 - 10.5 (mg/dL)    Total Protein 7.1  6.0 - 8.3 (g/dL)    Albumin 3.6  3.5 - 5.2 (g/dL)    AST 23  0 - 37 (U/L)    ALT 16  0 - 35 (U/L)    Alkaline Phosphatase 67  39 - 117 (U/L)    Total Bilirubin 0.9  0.3 - 1.2 (mg/dL)    GFR calc non Af Amer 85 (*) >90 (mL/min)    GFR calc Af Amer >90  >90 (mL/min)   APTT     Status: Abnormal   Collection Time   08/13/11  6:32 PM      Component Value Range Comment   aPTT 47 (*) 24 - 37 (seconds)   PROTIME-INR     Status: Abnormal   Collection Time   08/13/11  6:32 PM      Component Value Range Comment   Prothrombin Time 35.6 (*) 11.6 - 15.2  (seconds)    INR 3.49 (*) 0.00 - 1.49    URINALYSIS, ROUTINE W REFLEX MICROSCOPIC     Status: Abnormal   Collection Time   08/13/11  7:38 PM      Component Value Range Comment   Color, Urine AMBER (*) YELLOW  BIOCHEMICALS MAY BE AFFECTED BY COLOR   APPearance TURBID (*) CLEAR     Specific Gravity, Urine 1.029  1.005 - 1.030     pH 5.5  5.0 - 8.0     Glucose, UA NEGATIVE  NEGATIVE (mg/dL)    Hgb urine dipstick LARGE (*) NEGATIVE     Bilirubin Urine SMALL (*) NEGATIVE     Ketones, ur TRACE (*) NEGATIVE (mg/dL)    Protein, ur 30 (*) NEGATIVE (mg/dL)    Urobilinogen, UA 1.0  0.0 - 1.0 (mg/dL)    Nitrite POSITIVE (*) NEGATIVE     Leukocytes, UA LARGE (*) NEGATIVE    URINE MICROSCOPIC-ADD ON     Status: Abnormal   Collection Time   08/13/11  7:38 PM      Component Value Range Comment   WBC, UA TOO NUMEROUS TO COUNT  <3 (WBC/hpf) WITH CLUMPS   RBC / HPF 3-6  <3 (RBC/hpf)    Bacteria, UA MANY (*) RARE     Urine-Other MUCOUS PRESENT      No results found.  Review of Systems  Constitutional: Positive for weight loss. Negative for fever and chills.  HENT: Negative for congestion and sore throat.   Eyes: Negative for blurred vision and double vision.  Respiratory: Negative for cough, hemoptysis and shortness of breath.  Cardiovascular: Negative for chest pain and palpitations.  Gastrointestinal: Negative for heartburn, nausea, vomiting, abdominal pain, diarrhea, blood in stool and melena.  Genitourinary: Positive for dysuria and frequency. Negative for urgency.  Musculoskeletal: Negative for myalgias and falls.  Skin: Positive for rash. Negative for itching.  Neurological: Negative for dizziness, tingling and headaches.  Psychiatric/Behavioral: Positive for depression. Negative for suicidal ideas and substance abuse.    Blood pressure 127/78, pulse 107, temperature 100.3 F (37.9 C), temperature source Oral, resp. rate 21, SpO2 96.00%. Physical Exam  Constitutional: She is oriented  to person, place, and time. She appears well-developed and well-nourished.  HENT:  Head: Normocephalic and atraumatic.  Mouth/Throat: No oropharyngeal exudate.  Eyes: Conjunctivae and EOM are normal. Pupils are equal, round, and reactive to light. Right eye exhibits no discharge. Left eye exhibits no discharge. No scleral icterus.  Neck: Normal range of motion. Neck supple. No thyromegaly present.  Cardiovascular: An irregularly irregular rhythm present. Exam reveals no gallop and no friction rub.   No murmur heard. Respiratory: Effort normal and breath sounds normal.  GI: Soft. Bowel sounds are normal. She exhibits no distension. There is no tenderness. There is no rebound and no guarding.  Musculoskeletal: Normal range of motion. She exhibits no edema and no tenderness.  Lymphadenopathy:    She has no cervical adenopathy.  Neurological: She is alert and oriented to person, place, and time. She displays normal reflexes. No cranial nerve deficit. She exhibits normal muscle tone. Coordination normal.  Skin: Skin is warm and dry. No rash noted. No erythema.  Psychiatric: She has a normal mood and affect. Her behavior is normal. Judgment and thought content normal.     Assessment/Plan Fever secondary to UTI Malaise secondary to UTI UTI: tx with rocephin 1gm iv qday since this was started in the ED, await cultures and tailor abx Afib with RVR: pt missed her metoprolol dose,  We will give it now. Continue on digoxin, will give digoxin as well since she missed her dose today. Coagulopathy: hold coumadin,  Pharmacy to dose, INR in the am  CXR Hypokalemia: kcl 20 meq po x 1 Hyperlipidemia:  Continue on simvastatin Parkinsons: missed her sinement today, will give her first dose now Depression: continue fluoxetine, or substitute Anxiety: continue xanax prn Urinary incontinence: continue on Toviaz.   Pearson Grippe 08/13/2011, 11:11 PM

## 2011-08-13 NOTE — ED Notes (Signed)
[id number]<WG> Expected date:<BR> Expected time:<BR> Means of arrival:<BR> Comments:<BR> ems Spicher

## 2011-08-13 NOTE — ED Notes (Signed)
Pt brought in by [facility] from [facility] with c/o weakness x2 wks ,lost of appetite and out of Parkinsons meds for wks scheduled to see neuro 3/27 has increased temors and dystonia

## 2011-08-13 NOTE — ED Notes (Signed)
Attempted to ambulate patient. Patient was able to use the walker to stand, however once she got up she felt dizzy and weak and was not able to advance to steps.  Dr. Lynelle Doctor notified and aware. Plan to admit patient to hospital. Patient and family member aware.

## 2011-08-14 LAB — CBC
MCH: 30.5 pg (ref 26.0–34.0)
Platelets: 247 10*3/uL (ref 150–400)
RBC: 3.93 MIL/uL (ref 3.87–5.11)

## 2011-08-14 LAB — CARDIAC PANEL(CRET KIN+CKTOT+MB+TROPI)
CK, MB: 3.1 ng/mL (ref 0.3–4.0)
Relative Index: 1.4 (ref 0.0–2.5)
Relative Index: 1.2 (ref 0.0–2.5)
Total CK: 240 U/L — ABNORMAL HIGH (ref 7–177)
Total CK: 253 U/L — ABNORMAL HIGH (ref 7–177)
Troponin I: <0.3 ng/mL (ref <0.30)
Troponin I: <0.3 ng/mL (ref <0.30)

## 2011-08-14 LAB — COMPREHENSIVE METABOLIC PANEL
AST: 18 U/L (ref 0–37)
BUN: 16 mg/dL (ref 6–23)
CO2: 25 mEq/L (ref 19–32)
Chloride: 106 mEq/L (ref 96–112)
Creatinine, Ser: 0.76 mg/dL (ref 0.50–1.10)
GFR calc Af Amer: >90 mL/min (ref >90)
GFR calc non Af Amer: 85 mL/min — ABNORMAL LOW (ref >90)
Glucose, Bld: 99 mg/dL (ref 70–99)
Total Bilirubin: 0.8 mg/dL (ref 0.3–1.2)

## 2011-08-14 LAB — PROTIME-INR: INR: 4.04 — ABNORMAL HIGH (ref 0.00–1.49)

## 2011-08-14 LAB — DIGOXIN LEVEL: Digoxin Level: 0.5 ng/mL — ABNORMAL LOW (ref 0.8–2.0)

## 2011-08-14 MED ORDER — WARFARIN - PHARMACIST DOSING INPATIENT: Freq: Every day | Status: DC

## 2011-08-14 MED ORDER — ENSURE IMMUNE HEALTH PO LIQD
237.0000 mL | Freq: Three times a day (TID) | ORAL | Status: DC
  Administered 2011-08-14 – 2011-08-16 (×7): 237 mL via ORAL

## 2011-08-14 MED ORDER — POTASSIUM CHLORIDE CRYS ER 20 MEQ PO TBCR
40.0000 meq | EXTENDED_RELEASE_TABLET | Freq: Once | ORAL | Status: AC
  Administered 2011-08-14: 40 meq via ORAL
  Filled 2011-08-14: qty 2

## 2011-08-14 NOTE — Progress Notes (Signed)
CHART REVIEWED; B Pearle Wandler RN, BSN, MHA 

## 2011-08-14 NOTE — Progress Notes (Signed)
3 step skin care per order, pt on [facility] sheets only per order. Christy Pham

## 2011-08-14 NOTE — Progress Notes (Signed)
Subjective: Patient feeling better today. No chest pain, no dyspnea.  Dysuria on and off.   Objective: Filed Vitals:   08/13/11 1820 08/13/11 2018 08/14/11 0112 08/14/11 0620  BP:  127/78 94/55 111/64  Pulse:  107 90 84  Temp: 99.1 F (37.3 C) 100.3 F (37.9 C) 99.5 F (37.5 C) 97.8 F (36.6 C)  TempSrc: Oral Oral Oral Oral  Resp:  21 20 19   Height:   5.4" (0.137 m)   Weight:   84.1 kg (185 lb 6.5 oz)   SpO2:  96% 96% 99%   Weight change:   Intake/Output Summary (Last 24 hours) at 08/14/11 0946 Last data filed at 08/14/11 5621  Gross per 24 hour  Intake    620 ml  Output      0 ml  Net    620 ml    General: Alert, awake, oriented x3, in no acute distress.  HEENT: No bruits, no goiter.  Heart: Regular rate and rhythm, without murmurs, rubs, gallops.  Lungs: CTA, bilateral air movement.  Abdomen: Soft, nontender, nondistended, positive bowel sounds.  Neuro: Grossly intact, nonfocal. Extremities; trace edema.  Lab Results:  [facility] 08/14/11 0522 08/13/11 1832  NA 141 136  K 3.3* 3.4*  CL 106 99  CO2 25 24  GLUCOSE 99 100*  BUN 16 20  CREATININE 0.76 0.78  CALCIUM 8.8 9.6  MG -- --  PHOS -- --    Basename 08/14/11 0522 08/13/11 1832  AST 18 23  ALT 4 16  ALKPHOS 61 67  BILITOT 0.8 0.9  PROT 6.0 7.1  ALBUMIN 3.0* 3.6   Basename 08/14/11 0522 08/13/11 1832  WBC 6.7 9.2  NEUTROABS -- 6.1  HGB 12.0 13.0  HCT 36.2 38.0  MCV 92.1 91.6  PLT 247 259    Basename 08/14/11 0715 08/13/11 2349  CKTOTAL 250* 240*  CKMB 3.4 3.6  CKMBINDEX -- --  TROPONINI <0.30 <0.30    Micro Results: No results found for this or any previous visit (from the past 240 hour(s)).  Studies/Results: X-ray Chest Pa And Lateral   08/13/2011  *RADIOLOGY REPORT*  Clinical Data: Shortness of breath; atrial fibrillation.  CHEST - 2 VIEW  Comparison: Chest radiograph performed 12/02/2009  Findings: The lungs are well-aerated and clear.  There is no evidence of focal opacification,  pleural effusion or pneumothorax.  The heart is normal in size; calcification is noted within the aortic arch.  No acute osseous abnormalities are seen.  IMPRESSION: No acute cardiopulmonary process seen.  Original Report Authenticated By: Tonia Ghent, M.D.    Medications: I have reviewed the patient's current medications.  1-UTI:  Continue with ceftriaxone day 2. Will follow urine culture.  IV fluids 50 cc/hr.  2- A fib RVR: Rate controlled now. Continue with oral metoprolol, digoxin. Coumadin per pharmacy. Hold coumadin today.   ECHO pending.  Cardiac enzymes times 2 negative.  3-Hypokalemia: will replaced with 40 meq po times 1.  4- Parkinson:  Continue with Carbidopa-levodopa.  5-Hyperlipidemia: Continue on simvastatin   LOS: 1 day   Ariday Brinker M.D.  [facility] 08/14/2011, 9:46 AM

## 2011-08-14 NOTE — Consult Note (Signed)
WOC consult Note Reason for Consult:erythema, induration of buttocks and perineal area secondary to urinary incontinence and diaper use Wound type:Irritant dermatitis with a few, partial thickness areas of moisture associated skin damage (none greater than .5cm in diameter) Pressure Ulcer POA: No Measurement:Affeted area encompasses entire buttocks and periarea Dressing procedure/placement/frequency: I will use the 3-step preventative care products in our house formulary and incorporate a changing of underpad and application of the skin care products every 2 hours until the condition is resolved.  Patient is very cooperative and agrees that returning to bed at regular intervals will improve skin that is wet from urine.  DermaTherapy linen is described and its benefits to skin provided. Patient understands that we will not be using disposable underpads. I will not follow.  Please re-consult if needed. Thanks, Ladona Mow, MSN, RN, [facility], CWOCN [phone])

## 2011-08-14 NOTE — Progress Notes (Signed)
Echocardiogram 2D Echocardiogram has been performed.  Christy Pham L 08/14/2011, 8:51 AM

## 2011-08-14 NOTE — Evaluation (Signed)
Physical Therapy Evaluation Patient Details Name: Christy Pham MRN: [id number] DOB: Apr 26, 1946 Today's Date: 08/14/2011  Problem List:  Patient Active Problem List  Diagnoses  . OBESITY, UNSPECIFIED  . ATRIAL FIBRILLATION WITH RAPID VENTRICULAR RESPONSE  . CHRONIC SYSTOLIC HEART FAILURE    Past Medical History:  Past Medical History  Diagnosis Date  . Parkinson's disease     dx circa 1990 by Dr. Thad Ranger  . Hyperlipidemia     dx circa 2008  . Hypertension     dx circa 2008  . Osteoarthritis     L knee primarily   Past Surgical History:  Past Surgical History  Procedure Date  . Tubal ligation     circa 1983  . Tonsillectomy     circa 1950  . Colonoscopy     2006 by Jeani Hawking (clear per pt)    PT Assessment/Plan/Recommendation PT Assessment Clinical Impression Statement: Pt presents with generalized weakness and UTI with history of Parkinson's disease and urinary incontinence.  Noted area of skin breakdown on pts buttock and upper right leg under gluteal fold.  RN aware and states that she will be getting a wound consult.  Tolerated some ambulation with RW with noted fatigue after approx 25'.  Pt will benefit from skilled PT in acute venue to address weakness/endurance issues.  PT recommends HHPT for follow up to return pt to prior level of functioning.  PT Recommendation/Assessment: Patient will need skilled PT in the acute care venue PT Problem List: Decreased strength;Decreased activity tolerance;Decreased balance;Decreased mobility;Decreased coordination;Decreased knowledge of use of DME;Decreased skin integrity Barriers to Discharge: None PT Therapy Diagnosis : Difficulty walking;Abnormality of gait;Generalized weakness PT Plan PT Frequency: Min 3X/week PT Treatment/Interventions: DME instruction;Gait training;Functional mobility training;Therapeutic activities;Therapeutic exercise;Balance training;Patient/family education PT Recommendation Follow Up  Recommendations: Home health PT;Supervision for mobility/OOB Equipment Recommended: None recommended by PT PT Goals  Acute Rehab PT Goals PT Goal Formulation: With patient Time For Goal Achievement: 2 weeks Pt will go Supine/Side to Sit: with supervision PT Goal: Supine/Side to Sit - Progress: Goal set today Pt will go Sit to Supine/Side: with supervision PT Goal: Sit to Supine/Side - Progress: Goal set today Pt will go Sit to Stand: with supervision PT Goal: Sit to Stand - Progress: Goal set today Pt will go Stand to Sit: with supervision PT Goal: Stand to Sit - Progress: Goal set today Pt will Ambulate: 16 - 50 feet;with supervision;with least restrictive assistive device PT Goal: Ambulate - Progress: Goal set today Pt will Perform Home Exercise Program: with supervision, verbal cues required/provided PT Goal: Perform Home Exercise Program - Progress: Goal set today  PT Evaluation Precautions/Restrictions    Prior Functioning  Home Living Lives With: Spouse Receives Help From: Family Type of Home: House Home Layout: One level Home Access: Ramped entrance Bathroom Shower/Tub: Walk-in shower Home Adaptive Equipment: Environmental consultant - rolling;Straight cane;Walker - four wheeled;Wheelchair - manual;Wheelchair - powered;Shower chair with back Prior Function Level of Independence: Independent with basic ADLs;Requires assistive device for independence;Independent with transfers Driving: No Cognition Cognition Arousal/Alertness: Awake/alert Overall Cognitive Status: Appears within functional limits for tasks assessed Sensation/Coordination Sensation Light Touch: Appears Intact Coordination Gross Motor Movements are Fluid and Coordinated: No Coordination and Movement Description: Pt with R > L sided tremors due to Parkinson's disease.  Extremity Assessment RLE Assessment RLE Assessment: Exceptions to [facility] RLE Strength RLE Overall Strength Comments: hip flex 3+/5 with cues required for  pt posterior lean to compensate.  all other motions WFL.  LLE Assessment  LLE Assessment: Exceptions to [facility] LLE Strength LLE Overall Strength Comments: Hip flex 3/5 with cues for pt posterior lean to compensate.  All other motions WFL.  Mobility (including Balance) Bed Mobility Bed Mobility: Yes Supine to Sit: 1: +2 Total assist;Patient percentage (comment);HOB flat Supine to Sit Details (indicate cue type and reason): Pt assist 65%.  Requires some assist for LE off of bed and for trunk with cues for UE placement to self assist with trunk.  Transfers Transfers: Yes Sit to Stand: 1: +2 Total assist;Patient percentage (comment);From elevated surface;With upper extremity assist;From bed Sit to Stand Details (indicate cue type and reason): Pt assist 70%.  +2 for safety and line management.  cues for hand placement on bed when standing.  Stand to Sit: 1: +2 Total assist;Patient percentage (comment);With upper extremity assist;With armrests;To chair/3-in-1 Stand to Sit Details: Pt assist 85%.  +2 for safety.  cues for hand placement on arm rest when sitting.  Demos good controlled descent without cuing.  Ambulation/Gait Ambulation/Gait: Yes Ambulation/Gait Assistance: 1: +2 Total assist;Patient percentage (comment) Ambulation/Gait Assistance Details (indicate cue type and reason): Pt assist 65%.  Requires cues for sequencing/technique and some manual assist with RW due to pt tendency to step too far inside RW.  Also requires cuing for increased step length on L LE.   Ambulation Distance (Feet): 25 Feet Assistive device: Rolling walker Gait Pattern: Step-to pattern;Decreased step length - left;Trunk flexed;Shuffle Gait velocity: decreased Stairs: No Wheelchair Mobility Wheelchair Mobility: No    Exercise    End of Session PT - End of Session Equipment Utilized During Treatment: Gait belt Activity Tolerance: Patient limited by fatigue Patient left: in chair;with call bell in reach Nurse  Communication: Mobility status for transfers;Mobility status for ambulation General Behavior During Session: [facility] for tasks performed Cognition: [facility] for tasks performed  Page, Meribeth Mattes 08/14/2011, 10:26 AM

## 2011-08-14 NOTE — Progress Notes (Signed)
ANTICOAGULATION CONSULT NOTE - Follow up Consult  Pharmacy Consult for Warfarin Indication: atrial fibrillation  No Known Allergies  Patient Measurements: Height: 5.4" (13.7 cm) Weight: 185 lb 6.5 oz (84.1 kg) (bed scale) IBW/kg (Calculated) : -80.08    Vital Signs: Temp: 97.8 F (36.6 C) (03/19 0620) Temp src: Oral (03/19 0620) BP: 111/64 mmHg (03/19 0620) Pulse Rate: 84  (03/19 0620)  Labs:  Basename 08/14/11 0715 08/14/11 0522 08/13/11 2349 08/13/11 1832  HGB -- 12.0 -- 13.0  HCT -- 36.2 -- 38.0  PLT -- 247 -- 259  APTT -- -- -- 47*  LABPROT -- 39.9* -- 35.6*  INR -- 4.04* -- 3.49*  HEPARINUNFRC -- -- -- --  CREATININE -- 0.76 -- 0.78  CKTOTAL 250* -- 240* --  CKMB 3.4 -- 3.6 --  TROPONINI <0.30 -- <0.30 --   Estimated Creatinine Clearance: -15.5 ml/min (by C-G formula based on Cr of 0.76).  Medications:  Scheduled:     . acetaminophen  1,000 mg Oral Once  . carbidopa-levodopa  1 tablet Oral TID  . carbidopa-levodopa  1 tablet Oral Once  . cefTRIAXone (ROCEPHIN)  IV  1 g Intravenous Once  . cefTRIAXone (ROCEPHIN)  IV  1 g Intravenous Q24H  . digoxin  0.125 mg Oral Once  . digoxin  250 mcg Oral Daily  . feeding supplement  237 mL Oral TID WC  . Fesoterodine Fumarate  8 mg Oral Daily  . FLUoxetine  40 mg Oral Daily  . metoprolol tartrate  12.5 mg Oral BID  . metoprolol tartrate  12.5 mg Oral Once  . potassium chloride  20 mEq Oral Once  . potassium chloride  40 mEq Oral Once  . rOPINIRole  2 mg Oral QHS  . simvastatin  40 mg Oral QPC supper  . sodium chloride  3 mL Intravenous Q12H  . sodium chloride  3 mL Intravenous Q12H  . trihexyphenidyl  2 mg Oral BID WC   Assessment:  66 yo female on chronic coumadin for A-fib admitted with UTI  PTA warfarin dose 5 mg MWF and 7.5 mg all other days (last dose 3/17)  INR supratherapeutic and has increased, despite holding doses  No bleeding or complications noted  Goal of Therapy:  INR 2-3   Plan:    Continue to hold warfarin tonight.  Daily PT/INR    Lynann Beaver PharmD, BCPS Pager [phone] 08/14/2011 12:38 PM

## 2011-08-15 LAB — BASIC METABOLIC PANEL
CO2: 26 mEq/L (ref 19–32)
Calcium: 8.4 mg/dL (ref 8.4–10.5)
Creatinine, Ser: 0.67 mg/dL (ref 0.50–1.10)
Glucose, Bld: 124 mg/dL — ABNORMAL HIGH (ref 70–99)

## 2011-08-15 LAB — URINE CULTURE

## 2011-08-15 LAB — CBC
Hemoglobin: 11.3 g/dL — ABNORMAL LOW (ref 12.0–15.0)
MCH: 30.7 pg (ref 26.0–34.0)
MCV: 94 fL (ref 78.0–100.0)
RBC: 3.68 MIL/uL — ABNORMAL LOW (ref 3.87–5.11)

## 2011-08-15 MED ORDER — DIGOXIN 125 MCG PO TABS
0.1250 mg | ORAL_TABLET | Freq: Every day | ORAL | Status: DC
  Administered 2011-08-16: 0.125 mg via ORAL
  Filled 2011-08-15: qty 1

## 2011-08-15 NOTE — Progress Notes (Signed)
   CARE MANAGEMENT NOTE 08/15/2011  Patient:  Christy Pham, Christy Pham   Account Number:  [id number]  Date Initiated:  08/14/2011  Documentation initiated by:  Jiles Crocker  Subjective/Objective Assessment:   ADMITTED WITH WEAKNESS, UTI, PARKINSON     Action/Plan:   PCP IS DR Benedetto Goad, LIVES WITH SPOUSE   Anticipated DC Date:  08/21/2011   Anticipated DC Plan:  HOME W HOME HEALTH SERVICES         Choice offered to / List presented to:  C-1 Patient           Status of service:   Medicare Important Message given?   (If response is "NO", the following Medicare IM given date fields will be blank) Date Medicare IM given:   Date Additional Medicare IM given:    Discharge Disposition:    Per UR Regulation:  Reviewed for med. necessity/level of care/duration of stay  If discussed at Long Length of Stay Meetings, dates discussed:    Comments:  08/15/11 Darolyn Double RN,BSN NCM [phone] [facility] CHOSEN FOR HHPT,SUSAN DALE INFORMED OF ?D/C IN AM.START OF CARE PER SUSAN DALE IN 1 WEEK,PATIENT AWARE & AGREE.MD UPDATED.  08/14/2011- B CHANDLER RN, BSN, MHA

## 2011-08-15 NOTE — Progress Notes (Signed)
MD notified of patients 2.49 second pause. Patients heart rate has dropped down to 38, but came up to 62. BP stable. Patient was asymptomatic.  MD notified. No new orders at this time.  Will continue to monitor patient.

## 2011-08-15 NOTE — Progress Notes (Signed)
ANTICOAGULATION CONSULT NOTE - Follow up Consult  Pharmacy Consult for Warfarin Indication: atrial fibrillation  No Known Allergies  Patient Measurements: Height: 5.4" (13.7 cm) Weight: 185 lb 13.6 oz (84.3 kg) IBW/kg (Calculated) : -80.08    Vital Signs: Temp: 98.9 F (37.2 C) (03/20 1446) Temp src: Oral (03/20 1446) BP: 135/76 mmHg (03/20 1149) Pulse Rate: 68  (03/20 1446)  Labs:  Basename 08/15/11 0450 08/14/11 1518 08/14/11 0715 08/14/11 0522 08/13/11 2349 08/13/11 1832  HGB 11.3* -- -- 12.0 -- --  HCT 34.6* -- -- 36.2 -- 38.0  PLT 216 -- -- 247 -- 259  APTT -- -- -- -- -- 47*  LABPROT 35.5* -- -- 39.9* -- 35.6*  INR 3.48* -- -- 4.04* -- 3.49*  HEPARINUNFRC -- -- -- -- -- --  CREATININE 0.67 -- -- 0.76 -- 0.78  CKTOTAL -- 253* 250* -- 240* --  CKMB -- 3.1 3.4 -- 3.6 --  TROPONINI -- <0.30 <0.30 -- <0.30 --   Estimated Creatinine Clearance: -15.4 ml/min (by C-G formula based on Cr of 0.67).  Medications:  Scheduled:     . carbidopa-levodopa  1 tablet Oral TID  . cefTRIAXone (ROCEPHIN)  IV  1 g Intravenous Q24H  . digoxin  250 mcg Oral Daily  . feeding supplement  237 mL Oral TID WC  . Fesoterodine Fumarate  8 mg Oral Daily  . FLUoxetine  40 mg Oral Daily  . metoprolol tartrate  12.5 mg Oral BID  . rOPINIRole  2 mg Oral QHS  . simvastatin  40 mg Oral QPC supper  . sodium chloride  3 mL Intravenous Q12H  . sodium chloride  3 mL Intravenous Q12H  . trihexyphenidyl  2 mg Oral BID WC  . Warfarin - Pharmacist Dosing Inpatient   Does not apply q1800   Assessment:  66 yo female on chronic coumadin for A-fib admitted with UTI  PTA warfarin dose 5 mg MWF and 7.5 mg all other days (last dose 3/17)  INR remains supratherapeutic  No bleeding or complications noted  Goal of Therapy:  INR 2-3   Plan:   Continue to hold warfarin tonight. - plan to resume dosing once INR < 3  Daily PT/INR   Darrol Angel, PharmD Pager: [phone] 08/15/2011 3:17 PM

## 2011-08-15 NOTE — Progress Notes (Signed)
PROGRESS NOTE  Christy Pham:[id number] DOB: 1945-07-26 DOA: 08/13/2011 PCP: Pamelia Hoit, MD, MD Mady Gemma, PA Cardiologist: Shela Commons. Antoine Poche, MD Neurologist: Marlan Palau, M.D. followup March 27. Neurologist: Ezzie Dural M.D. followup April 8.  Brief narrative: 66 year old woman with a history of atrial fibrillation was transferred from her primary care physician's office to the emergency department for further evaluation. At that point was found to have atrial fibrillation with rapid ventricular response and urinary tract infection. Ran out of levodopa 2 weeks ago. Emergency department impression: Generalized weakness, UTI  Chart review  02/02/2010: Normal nuclear stress test.  12/02/2009: The diagnosis of atrial fibrillation.  Past medical history: Parkinson's disease, hyperlipidemia, anxiety, atrial fibrillation  Consultants:  Physical therapy: Home health physical therapy. No equipment recommended.  Procedures:  08/14/2011: 2D echocardiogram: Left ventricular ejection fraction 50-55%. Normal wall motion. Unable to evaluate diastolic function secondary to atrial fibrillation.  Antibiotics:  March 18: Ceftriaxone  Interim History: Chart reviewed in detail. Urine culture pending.  Subjective: Feels better.  Objective: Filed Vitals:   08/14/11 2258 08/15/11 0500 08/15/11 0537 08/15/11 0800  BP: 99/59  104/66 123/75  Pulse: 74  80 81  Temp: 98.2 F (36.8 C)  98.3 F (36.8 C) 98.4 F (36.9 C)  TempSrc: Oral   Oral  Resp: 19  18 14   Height:      Weight:  84.3 kg (185 lb 13.6 oz)    SpO2: 96%  98% 97%    Intake/Output Summary (Last 24 hours) at 08/15/11 1148 Last data filed at 08/15/11 0851  Gross per 24 hour  Intake 1934.5 ml  Output      0 ml  Net 1934.5 ml    Exam:   General:  Appears calm and comfortable.  Cardiovascular: Irregular rhythm. Normal rate. No murmur, rub, gallop. No lower extremity edema.  Telemetry: Atrial fibrillation.  Rate controlled.  Respiratory: Clear to auscultation bilaterally. No wheezes, rales, rhonchi. Normal respiratory effort.  Psychiatric: Grossly normal mood and affect. Speech fluent and appropriate.  Data Reviewed: Basic Metabolic Panel:  Lab 08/15/11 9562 08/14/11 0522 08/13/11 1832  NA 140 141 136  K 3.6 3.3* --  CL 109 106 99  CO2 26 25 24   GLUCOSE 124* 99 100*  BUN 19 16 20   CREATININE 0.67 0.76 0.78  CALCIUM 8.4 8.8 9.6  MG -- -- --  PHOS -- -- --   Liver Function Tests:  Lab 08/14/11 0522 08/13/11 1832  AST 18 23  ALT 4 16  ALKPHOS 61 67  BILITOT 0.8 0.9  PROT 6.0 7.1  ALBUMIN 3.0* 3.6   CBC:  Lab 08/15/11 0450 08/14/11 0522 08/13/11 1832  WBC 6.4 6.7 9.2  NEUTROABS -- -- 6.1  HGB 11.3* 12.0 13.0  HCT 34.6* 36.2 38.0  MCV 94.0 92.1 91.6  PLT 216 247 259   Cardiac Enzymes:  Lab 08/14/11 1518 08/14/11 0715 08/13/11 2349  CKTOTAL 253* 250* 240*  CKMB 3.1 3.4 3.6  CKMBINDEX -- -- --  TROPONINI <0.30 <0.30 <0.30    Recent Results (from the past 240 hour(s))  URINE CULTURE     Status: Normal (Preliminary result)   Collection Time   08/13/11  7:38 PM      Component Value Range Status Comment   Specimen Description URINE, CATHETERIZED   Final    Special Requests NONE   Final    Culture  Setup Time [id number]   Final    Colony Count >=100,000 COLONIES/ML   Final  Culture ESCHERICHIA COLI   Final    Report Status PENDING   Incomplete      Studies: X-ray Chest Pa And Lateral   08/13/2011  *RADIOLOGY REPORT*  Clinical Data: Shortness of breath; atrial fibrillation.  CHEST - 2 VIEW  Comparison: Chest radiograph performed 12/02/2009  Findings: The lungs are well-aerated and clear.  There is no evidence of focal opacification, pleural effusion or pneumothorax.  The heart is normal in size; calcification is noted within the aortic arch.  No acute osseous abnormalities are seen.  IMPRESSION: No acute cardiopulmonary process seen.  Original Report Authenticated  By: Tonia Ghent, M.D.    Scheduled Meds:   . carbidopa-levodopa  1 tablet Oral TID  . cefTRIAXone (ROCEPHIN)  IV  1 g Intravenous Q24H  . digoxin  250 mcg Oral Daily  . feeding supplement  237 mL Oral TID WC  . Fesoterodine Fumarate  8 mg Oral Daily  . FLUoxetine  40 mg Oral Daily  . metoprolol tartrate  12.5 mg Oral BID  . rOPINIRole  2 mg Oral QHS  . simvastatin  40 mg Oral QPC supper  . sodium chloride  3 mL Intravenous Q12H  . sodium chloride  3 mL Intravenous Q12H  . trihexyphenidyl  2 mg Oral BID WC  . Warfarin - Pharmacist Dosing Inpatient   Does not apply q1800   Continuous Infusions:   . sodium chloride 50 mL/hr at 08/15/11 0037     Assessment/Plan: 1. UTI: Followup culture. Continue ceftriaxone. 2. Atrial fibrillation with rapid ventricular response: Now rate controlled. Secondary to missed dose of metoprolol and digoxin possibly complicated by UTI. Continue metoprolol and digoxin. Warfarin on hold secondary to supratherapeutic INR.  3. Generalized weakness: Home health. Physical therapy. 4. Hypokalemia: Repleted. 5. Parkinson's disease: Continue Sinemet. 6. Depression: Appears stable. 7. Urinary incontinence: Continue Toviaz. Wound care.  Code Status: Full code. Family Communication: Discussed with husband at bedside. All questions answered to his apparent satisfaction. Disposition Plan: Anticipate discharge March 21.   Brendia Sacks, MD  [facility] Pager [phone] 08/15/2011, 11:48 AM    LOS: 2 days

## 2011-08-16 MED ORDER — CIPROFLOXACIN HCL 250 MG PO TABS
250.0000 mg | ORAL_TABLET | Freq: Two times a day (BID) | ORAL | Status: DC
  Filled 2011-08-16: qty 1

## 2011-08-16 MED ORDER — WARFARIN SODIUM 5 MG PO TABS: 5.0000 mg | ORAL_TABLET | Freq: Every day | ORAL | Status: DC

## 2011-08-16 MED ORDER — CIPROFLOXACIN HCL 250 MG PO TABS: 250.0000 mg | ORAL_TABLET | Freq: Two times a day (BID) | ORAL | Status: DC

## 2011-08-16 NOTE — Progress Notes (Signed)
   CARE MANAGEMENT NOTE 08/16/2011  Patient:  Christy Pham, Christy Pham   Account Number:  [id number]  Date Initiated:  08/14/2011  Documentation initiated by:  Jiles Crocker  Subjective/Objective Assessment:   ADMITTED WITH WEAKNESS, UTI, PARKINSON     Action/Plan:   PCP IS DR Benedetto Goad, LIVES WITH SPOUSE   Anticipated DC Date:  08/16/2011   Anticipated DC Plan:  HOME W HOME HEALTH SERVICES         Choice offered to / List presented to:  C-1 Patient        HH arranged  HH-2 PT      [facility] agency  [facility]   Status of service:  Completed, signed off Medicare Important Message given?   (If response is "NO", the following Medicare IM given date fields will be blank) Date Medicare IM given:   Date Additional Medicare IM given:    Discharge Disposition:  HOME W HOME HEALTH SERVICES  Per UR Regulation:  Reviewed for med. necessity/level of care/duration of stay  If discussed at Long Length of Stay Meetings, dates discussed:    Comments:  08/16/11 Khushi Zupko RN,BSN NCM [phone] [facility])INFORMED OF D/C TODAY HOME W/HHPT.  08/15/11 Steel Kerney RN,BSN NCM [phone] [facility] CHOSEN FOR HHPT,[facility] INFORMED OF ?D/C IN AM.START OF CARE PER [facility] IN 1 WEEK,PATIENT AWARE & AGREE.MD UPDATED.  08/14/2011- B CHANDLER RN, BSN, MHA

## 2011-08-16 NOTE — Discharge Summary (Signed)
Physician Discharge Summary  Christy Pham ZOX:[id number] DOB: May 25, 1946 DOA: 08/13/2011  PCP: Pamelia Hoit, MD, MD Cardiologist: Shela Commons. Antoine Poche, MD Neurologist: Marlan Palau, M.D. followup March 27. Neurologist: Ezzie Dural M.D. followup April 8.  Admit date: 08/13/2011 Discharge date: 08/16/2011  Discharge Diagnoses:  1. Urinary tract infection 2. Atrial fibrillation with rapid ventricular response 3. Generalized weakness  Discharge Condition: Improved  Disposition: Home with home health physical therapy  History of present illness:  66 year old woman with a history of atrial fibrillation was transferred from her primary care physician's office to the emergency department for further evaluation. At that point was found to have atrial fibrillation with rapid ventricular response and urinary tract infection.  Hospital Course:  Christy Pham was admitted medical floor. She was restarted on her chronic agents for atrial fibrillation. She remains rate controlled at this point. Warfarin was initially held secondary to a supratherapeutic INR. INR is now back in normal range. She will resume warfarin at a slightly lower dose per week until followup PT/INR in the next few days. Further adjustments can be made by primary care physician. She was also treated for urinary tract infection and evaluated by physical therapy for generalized weakness. She is stable for discharge at this time. 1. UTI: History she had coli sensitive to ciprofloxacin and Rocephin.  2. Atrial fibrillation with rapid ventricular response: Now rate controlled. Secondary to missed dose of metoprolol and digoxin possibly complicated by UTI. Continue metoprolol and digoxin. Warfarin was on hold secondary to supratherapeutic INR. Resume warfarin this evening. Repeat INR in the next few days.  3. Generalized weakness: Home health. Physical therapy.  4. Hypokalemia: Repleted.  5. Parkinson's disease: Continue Sinemet.   6. Depression: Appears stable.  7. Urinary incontinence: Continue Toviaz. Patient able to perform own wound care.  Consultants:  Physical therapy: Home health physical therapy. No equipment recommended.  Procedures:  08/14/2011: 2D echocardiogram: Left ventricular ejection fraction 50-55%. Normal wall motion. Unable to evaluate diastolic function secondary to atrial fibrillation.  Antibiotics:  March 18: Ceftriaxone   Discharge Instructions  Discharge Orders    Future Orders Please Complete By Expires   Diet general      Increase activity slowly        Medication List  As of 08/16/2011  1:17 PM   STOP taking these medications         oxybutynin 5 MG 24 hr tablet         TAKE these medications         ALPRAZolam 0.25 MG tablet   Commonly known as: XANAX   Take 0.25 mg by mouth 3 (three) times daily as needed. For anxiety      carbidopa-levodopa 25-100 MG per tablet   Commonly known as: SINEMET   Take 1 tablet by mouth 3 (three) times daily.      digoxin 0.25 MG tablet   Commonly known as: LANOXIN   Take 250 mcg by mouth daily.      FLUoxetine 40 MG capsule   Commonly known as: PROZAC   Take 40 mg by mouth daily.      metoprolol tartrate 25 MG tablet   Commonly known as: LOPRESSOR   Take 12.5 mg by mouth 2 (two) times daily.      rOPINIRole 2 MG tablet   Commonly known as: REQUIP   Take 2 mg by mouth at bedtime.      simvastatin 40 MG tablet   Commonly known as: ZOCOR   Take  40 mg by mouth every evening.      TOVIAZ 8 MG Tb24   Generic drug: Fesoterodine Fumarate   Take 8 mg by mouth daily.      trihexyphenidyl 2 MG tablet   Commonly known as: ARTANE   Take 2 mg by mouth 2 (two) times daily with a meal.      warfarin 5 MG tablet   Commonly known as: COUMADIN   Take 1 tablet (5 mg total) by mouth daily. Take 1 (5 mg) tablet by mouth daily.           Follow-up Information    Follow up with [facility]. (HHPT-START OF CARE IN 1 WEEK)     Contact information:   [address] [place] [address] [phone]      Follow up with Pamelia Hoit, MD. Schedule an appointment as soon as possible for a visit in 4 days. ( For PT/INR check.)    Contact information:   [address] [place] [address] [phone]           The results of significant diagnostics from this hospitalization (including imaging, microbiology, ancillary and laboratory) are listed below for reference.    Significant Diagnostic Studies: X-ray Chest Pa And Lateral   08/13/2011  *RADIOLOGY REPORT*  Clinical Data: Shortness of breath; atrial fibrillation.  CHEST - 2 VIEW  Comparison: Chest radiograph performed 12/02/2009  Findings: The lungs are well-aerated and clear.  There is no evidence of focal opacification, pleural effusion or pneumothorax.  The heart is normal in size; calcification is noted within the aortic arch.  No acute osseous abnormalities are seen.  IMPRESSION: No acute cardiopulmonary process seen.  Original Report Authenticated By: Tonia Ghent, M.D.    Microbiology: Recent Results (from the past 240 hour(s))  URINE CULTURE     Status: Normal   Collection Time   08/13/11  7:38 PM      Component Value Range Status Comment   Specimen Description URINE, CATHETERIZED   Final    Special Requests NONE   Final    Culture  Setup Time [id number]   Final    Colony Count >=100,000 COLONIES/ML   Final    Culture ESCHERICHIA COLI   Final    Report Status 08/15/2011 FINAL   Final    Organism ID, Bacteria ESCHERICHIA COLI   Final      Labs: Basic Metabolic Panel:  Lab 08/15/11 8469 08/14/11 0522 08/13/11 1832  NA 140 141 136  K 3.6 3.3* --  CL 109 106 99  CO2 26 25 24   GLUCOSE 124* 99 100*  BUN 19 16 20   CREATININE 0.67 0.76 0.78  CALCIUM 8.4 8.8 9.6  MG -- -- --  PHOS -- -- --   Liver Function Tests:  Lab 08/14/11 0522 08/13/11 1832  AST 18 23  ALT 4 16  ALKPHOS 61 67  BILITOT 0.8 0.9  PROT 6.0 7.1   ALBUMIN 3.0* 3.6   CBC:  Lab 08/15/11 0450 08/14/11 0522 08/13/11 1832  WBC 6.4 6.7 9.2  NEUTROABS -- -- 6.1  HGB 11.3* 12.0 13.0  HCT 34.6* 36.2 38.0  MCV 94.0 92.1 91.6  PLT 216 247 259   Cardiac Enzymes:  Lab 08/14/11 1518 08/14/11 0715 08/13/11 2349  CKTOTAL 253* 250* 240*  CKMB 3.1 3.4 3.6  CKMBINDEX -- -- --  TROPONINI <0.30 <0.30 <0.30    Time coordinating discharge: 25 minutes.  Signed:  Brendia Sacks, MD  [facility] 08/16/2011, 1:13 PM

## 2011-08-16 NOTE — Progress Notes (Signed)
Physical Therapy Treatment Patient Details Name: Christy Pham MRN: [id number] DOB: 09-21-1945 Today's Date: 08/16/2011  PT Assessment/Plan  PT - Assessment/Plan Comments on Treatment Session: Pt able to tolerate increased ambulation distance today with seated rest breaks.  Also demonstrated ankle pumps, quad sets and glut sets 2 sets of 10 for pt to perform on her own.  Pt plans to d/c home with spouse assist as needed. PT Plan: Discharge plan remains appropriate Follow Up Recommendations: Home health PT;Supervision for mobility/OOB Equipment Recommended: None recommended by PT PT Goals  Acute Rehab PT Goals PT Goal: Supine/Side to Sit - Progress: Progressing toward goal PT Goal: Sit to Stand - Progress: Progressing toward goal PT Goal: Stand to Sit - Progress: Progressing toward goal PT Goal: Ambulate - Progress: Progressing toward goal  PT Treatment Precautions/Restrictions  Precautions Precautions: Fall Mobility (including Balance) Bed Mobility Supine to Sit: 4: Min assist;HOB elevated (Comment degrees) Supine to Sit Details (indicate cue type and reason): assist for trunk Sitting - Scoot to Edge of Bed: 3: Mod assist Sitting - Scoot to Edge of Bed Details (indicate cue type and reason): use of bed pad to assist hips to EOB Transfers Transfers: Yes Sit to Stand: 4: Min assist;With upper extremity assist;From bed;From chair/3-in-1 Sit to Stand Details (indicate cue type and reason): assist to rise from bed however when able to use armrests pt is min/guard Stand to Sit: To chair/3-in-1;With upper extremity assist;4: Min assist Stand to Sit Details: min/guard with armrests Stand Pivot Transfers: 4: Min assist Stand Pivot Transfer Details (indicate cue type and reason): min/guard with RW, verbal cues for technique Ambulation/Gait Ambulation/Gait: Yes Ambulation/Gait Assistance: 4: Min assist Ambulation/Gait Assistance Details (indicate cue type and reason): min/guard, increased  verbal cues for RW placement to increase step length, pt took seated rest breaks between 10 feet, 20 feet, 20 feet, pt reports increased L knee pain from arthritis toward last 20 feet, leads with L LE 2* "my Parkinson's affects my R side" Ambulation Distance (Feet): 50 Feet (total) Assistive device: Rolling walker Gait Pattern: Step-to pattern;Decreased step length - left;Trunk flexed Gait velocity: decreased    Exercise    End of Session PT - End of Session Equipment Utilized During Treatment: Gait belt Activity Tolerance: Patient tolerated treatment well Patient left: in chair;with call bell in reach General Behavior During Session: [facility] for tasks performed Cognition: [facility] for tasks performed  Brittlyn Cloe,KATHrine E 08/16/2011, 12:14 PM Pager: [phone]

## 2011-08-16 NOTE — Progress Notes (Signed)
PROGRESS NOTE  Christy Pham ZOX:[id number] DOB: 03/19/1946 DOA: 08/13/2011 PCP: Pamelia Hoit, MD, MD Mady Gemma, PA Cardiologist: Shela Commons. Antoine Poche, MD Neurologist: Marlan Palau, M.D. followup March 27. Neurologist: Ezzie Dural M.D. followup April 8.  Brief narrative: 66 year old woman with a history of atrial fibrillation was transferred from her primary care physician's office to the emergency department for further evaluation. At that point was found to have atrial fibrillation with rapid ventricular response and urinary tract infection.  Chart review  02/02/2010: Normal nuclear stress test.  12/02/2009: The diagnosis of atrial fibrillation.  Past medical history: Parkinson's disease, hyperlipidemia, anxiety, atrial fibrillation  Consultants:  Physical therapy: Home health physical therapy. No equipment recommended.  Procedures:  08/14/2011: 2D echocardiogram: Left ventricular ejection fraction 50-55%. Normal wall motion. Unable to evaluate diastolic function secondary to atrial fibrillation.  Antibiotics:  March 18: Ceftriaxone  Interim History: Chart reviewed in detail. Urine culture pending.  Subjective: Feels well. Ready to go home.  Objective: Filed Vitals:   08/15/11 1446 08/15/11 2141 08/16/11 0509 08/16/11 1217  BP: 95/60 96/63 124/75 124/78  Pulse: 68 104 78 88  Temp: 98.9 F (37.2 C) 99.2 F (37.3 C) 99.4 F (37.4 C)   TempSrc: Oral Oral Oral   Resp: 16 18 16    Height:      Weight:   85.4 kg (188 lb 4.4 oz)   SpO2: 95% 94% 96%     Intake/Output Summary (Last 24 hours) at 08/16/11 1259 Last data filed at 08/16/11 0600  Gross per 24 hour  Intake    890 ml  Output      0 ml  Net    890 ml    Exam:   General:  Appears calm and comfortable.  Cardiovascular: Irregular rhythm. Normal rate. No murmur, rub, gallop. No lower extremity edema.  Telemetry: Atrial fibrillation. Rate controlled.  Respiratory: Clear to auscultation bilaterally.  No wheezes, rales, rhonchi. Normal respiratory effort.  Psychiatric: Grossly normal mood and affect. Speech fluent and appropriate.  Data Reviewed: Basic Metabolic Panel:  Lab 08/15/11 8119 08/14/11 0522 08/13/11 1832  NA 140 141 136  K 3.6 3.3* --  CL 109 106 99  CO2 26 25 24   GLUCOSE 124* 99 100*  BUN 19 16 20   CREATININE 0.67 0.76 0.78  CALCIUM 8.4 8.8 9.6  MG -- -- --  PHOS -- -- --   Liver Function Tests:  Lab 08/14/11 0522 08/13/11 1832  AST 18 23  ALT 4 16  ALKPHOS 61 67  BILITOT 0.8 0.9  PROT 6.0 7.1  ALBUMIN 3.0* 3.6   CBC:  Lab 08/15/11 0450 08/14/11 0522 08/13/11 1832  WBC 6.4 6.7 9.2  NEUTROABS -- -- 6.1  HGB 11.3* 12.0 13.0  HCT 34.6* 36.2 38.0  MCV 94.0 92.1 91.6  PLT 216 247 259   Cardiac Enzymes:  Lab 08/14/11 1518 08/14/11 0715 08/13/11 2349  CKTOTAL 253* 250* 240*  CKMB 3.1 3.4 3.6  CKMBINDEX -- -- --  TROPONINI <0.30 <0.30 <0.30    Recent Results (from the past 240 hour(s))  URINE CULTURE     Status: Normal   Collection Time   08/13/11  7:38 PM      Component Value Range Status Comment   Specimen Description URINE, CATHETERIZED   Final    Special Requests NONE   Final    Culture  Setup Time [id number]   Final    Colony Count >=100,000 COLONIES/ML   Final    Culture ESCHERICHIA COLI  Final    Report Status 08/15/2011 FINAL   Final    Organism ID, Bacteria ESCHERICHIA COLI   Final      Studies: X-ray Chest Pa And Lateral   08/13/2011  *RADIOLOGY REPORT*  Clinical Data: Shortness of breath; atrial fibrillation.  CHEST - 2 VIEW  Comparison: Chest radiograph performed 12/02/2009  Findings: The lungs are well-aerated and clear.  There is no evidence of focal opacification, pleural effusion or pneumothorax.  The heart is normal in size; calcification is noted within the aortic arch.  No acute osseous abnormalities are seen.  IMPRESSION: No acute cardiopulmonary process seen.  Original Report Authenticated By: Tonia Ghent, M.D.     Scheduled Meds:    . carbidopa-levodopa  1 tablet Oral TID  . cefTRIAXone (ROCEPHIN)  IV  1 g Intravenous Q24H  . digoxin  0.125 mg Oral Daily  . feeding supplement  237 mL Oral TID WC  . Fesoterodine Fumarate  8 mg Oral Daily  . FLUoxetine  40 mg Oral Daily  . metoprolol tartrate  12.5 mg Oral BID  . rOPINIRole  2 mg Oral QHS  . simvastatin  40 mg Oral QPC supper  . sodium chloride  3 mL Intravenous Q12H  . sodium chloride  3 mL Intravenous Q12H  . trihexyphenidyl  2 mg Oral BID WC  . Warfarin - Pharmacist Dosing Inpatient   Does not apply q1800  . DISCONTD: digoxin  250 mcg Oral Daily   Continuous Infusions:    . DISCONTD: sodium chloride 50 mL/hr at 08/15/11 0037     Assessment/Plan: 1. UTI: History she had coli sensitive to ciprofloxacin and Rocephin. 2. Atrial fibrillation with rapid ventricular response: Now rate controlled. Secondary to missed dose of metoprolol and digoxin possibly complicated by UTI. Continue metoprolol and digoxin. Warfarin was on hold secondary to supratherapeutic INR. Resume warfarin this evening. Repeat INR in the next few days. 3. Generalized weakness: Home health. Physical therapy. 4. Hypokalemia: Repleted. 5. Parkinson's disease: Continue Sinemet. 6. Depression: Appears stable. 7. Urinary incontinence: Continue Toviaz. Wound care.  Code Status: Full code. Family Communication: Discussed with husband again today at bedside. All questions answered to his apparent satisfaction. Disposition Plan: Discharge home today. Home health physical therapy. No equipment recommended.   Brendia Sacks, MD  [facility] Pager [phone] 08/16/2011, 12:59 PM    LOS: 3 days

## 2012-08-13 ENCOUNTER — Other Ambulatory Visit: Payer: Self-pay | Admitting: Neurology

## 2012-09-22 ENCOUNTER — Encounter: Payer: Self-pay | Admitting: Neurology

## 2012-09-22 ENCOUNTER — Ambulatory Visit (INDEPENDENT_AMBULATORY_CARE_PROVIDER_SITE_OTHER): Payer: Medicare Other | Admitting: Neurology

## 2012-09-22 VITALS — BP 130/78 | HR 76 | Temp 98.3°F | Resp 20

## 2012-09-22 DIAGNOSIS — F329 Major depressive disorder, single episode, unspecified: Secondary | ICD-10-CM

## 2012-09-22 DIAGNOSIS — F3289 Other specified depressive episodes: Secondary | ICD-10-CM

## 2012-09-22 DIAGNOSIS — R269 Unspecified abnormalities of gait and mobility: Secondary | ICD-10-CM

## 2012-09-22 DIAGNOSIS — G20A1 Parkinson's disease without dyskinesia, without mention of fluctuations: Secondary | ICD-10-CM

## 2012-09-22 DIAGNOSIS — G219 Secondary parkinsonism, unspecified: Secondary | ICD-10-CM

## 2012-09-22 DIAGNOSIS — G20 Parkinson's disease: Secondary | ICD-10-CM

## 2012-09-22 MED ORDER — ROPINIROLE HCL 2 MG PO TABS: ORAL_TABLET | ORAL | Status: DC

## 2012-09-22 NOTE — Progress Notes (Signed)
Christy Pham was seen today in the movement disorders clinic for neurologic consultation at the request of Pamelia Hoit, MD at [facility]    The consultation is for the evaluation of Parkinson's disease.  I have notes from Dr. Lesia Sago.   This patient is accompanied in the office by her spouse  [name]) who supplements the history.  The last note that I have from Dr Anne Hahn was May, 2013.  The patient was seen by Dr. Thad Ranger at [facility] prior to Dr. Anne Hahn.  She has had symptoms ever since 2001.  The patient reports first sx was tremor in the R hand that was intermittent.  She is R hand dominant.  She now rarely has tremor in the L hand and has had that for about 2 years.  She believes that her first drug was requip.  She cannot remember the year and is unsure if the requip was or is helpful.  Several years later, she started on levodopa.  It was beneficial but not dramatic.  She was changed to stalevo in May 2013.  She is unsure if the change was helpful.    Currently, the patient is on Stalevo 100 mg 3 times a day.  She is on Requip 3 mg 3 times a day.  She was previously on Artane.   Specific Symptoms:  Tremor: yes Voice: no changes Sleep: doing better.  Sleeps and "lives" in lift chair per husband; husband worries that she does not leave the lift chair some days.  Vivid Dreams:  yes  Acting out dreams:  yes but very rarely will scream out  Wet Pillows: yes (primarily night) Postural symptoms:  yes (doing home PT right now)  Falls?  no (been 4 years since fall but never gets out of chair and therefore has bed sores) Bradykinesia symptoms: difficulty with initiating movement, slow movements and difficulty getting out of a chair Loss of smell:  no Loss of taste:  no Urinary Incontinence:  yes (been at urologist for 2-3 years, okay during the day but urinary incontinence at night).  Some bowel control loss at night. Difficulty Swallowing:  no Handwriting, micrographia: yes Trouble with  ADL's:  yes (trouble putting on clothes, bathing... Has someone from [employer] 2 hours per day)  Trouble buttoning clothing: yes Depression:  yes Memory changes:  no (had a problem with this in past and husband thought it was med related) Hallucinations:  yes  (always sees people in the back yard)  visual distortions: no N/V:  no Lightheaded:  no  Syncope: no Diplopia:  no Dyskinesia:  yes but not significant  Neuroimaging has previously been performed.  It is not available for my review today.    2004 MRI report: CLINICAL DATA: TREMORS ON RIGHT SIDE.  MRI OF THE BRAIN WITHOUT AND WITH CONTRAST  NO COMPARISON.  THERE IS MILD BRAIN ATROPHY. THERE IS A SMALL HYPERINTENSITY IN THE POSTERIOR LIMB OF THE INTERNAL  CAPSULE ON THE LEFT WHICH APPEARS TO BE AN OLD INFARCT. THE REMAINDER OF THE WHITE MATTER APPEARS  NORMAL. THE BRAINSTEM APPEARS NORMAL. THERE IS NO MASS OR ACUTE INFARCT. THERE IS NO PATHOLOGIC  ENHANCEMENT.  IMPRESSION  NO ACUTE ABNORMALITY.   PREVIOUS MEDICATIONS: Sinemet, Requip and artane  ALLERGIES:  No Known Allergies  CURRENT MEDICATIONS:  Current Outpatient Prescriptions on File Prior to Visit  Medication Sig Dispense Refill  . ALPRAZolam (XANAX) 0.25 MG tablet Take 0.25 mg by mouth 3 (three) times daily as needed. For anxiety      .  carbidopa-levodopa-entacapone (STALEVO) 25-100-200 MG per tablet ONE TABLET THREE TIMES A DAY  90 tablet  5  . digoxin (LANOXIN) 0.25 MG tablet Take 250 mcg by mouth daily.      Marland Kitchen FLUoxetine (PROZAC) 40 MG capsule Take 40 mg by mouth daily.      . metoprolol tartrate (LOPRESSOR) 25 MG tablet Take 12.5 mg by mouth 2 (two) times daily.      Marland Kitchen rOPINIRole (REQUIP) 3 MG tablet ONE TABLET THREE TIMES A DAY  90 tablet  5  . simvastatin (ZOCOR) 40 MG tablet Take 40 mg by mouth every evening.      . warfarin (COUMADIN) 5 MG tablet Take 1 tablet (5 mg total) by mouth daily. Take 1 (5 mg) tablet by mouth daily.      . carbidopa-levodopa  (SINEMET) 25-100 MG per tablet Take 1 tablet by mouth 3 (three) times daily.       No current facility-administered medications on file prior to visit.    PAST MEDICAL HISTORY:   Past Medical History  Diagnosis Date  . Parkinson's disease     dx circa 1990 by Dr. Thad Ranger  . Hyperlipidemia     dx circa 2008  . Hypertension     dx circa 2008  . Osteoarthritis     L knee primarily  . Atrial fibrillation     PAST SURGICAL HISTORY:   Past Surgical History  Procedure Laterality Date  . Tubal ligation      circa 1983  . Tonsillectomy      circa 1950  . Colonoscopy      2006 by Jeani Hawking (clear per pt)    SOCIAL HISTORY:   History   Social History  . Marital Status: Married    Spouse Name: N/A    Number of Children: 1  . Years of Education: 16   Occupational History  . 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   Social History Main Topics  . Smoking status: Never Smoker   . Smokeless tobacco: Never Used  . Alcohol Use: Yes     Comment: 3-4 times per week (brandy, occ wine)  . Drug Use: No  . Sexually Active: Not Currently   Other Topics Concern  . Not on file   Social History Narrative  . No narrative on file    FAMILY HISTORY:   Family Status  Relation Status Death Age  . Father Deceased 52    AAA  . Mother Deceased 23    "old age"  . Sister Alive 10    healthy  . Child Alive     1 son, healthy    ROS:  A complete 10 system review of systems was obtained and was unremarkable apart from what is mentioned above.  PHYSICAL EXAMINATION:    VITALS:   Filed Vitals:   09/22/12 1341  BP: 130/78  Pulse: 76  Temp: 98.3 F (36.8 C)  Resp: 20    GEN:  The patient appears stated age and is in NAD. HEENT:  Normocephalic, atraumatic.  The mucous membranes are moist. The superficial temporal arteries are without ropiness or tenderness. CV:  Irregular. Lungs:  CTAB Neck/HEME:  There are no  carotid bruits bilaterally. Exts:  There is sig LE edema  Neurological examination:  Orientation: The patient is alert and oriented x3. Fund of knowledge is appropriate.  Recent and remote memory are intact.  Attention and concentration are  normal.    Able to name objects and repeat phrases. Cranial nerves: There is good facial symmetry. Pupils are equal round and reactive to light bilaterally. Fundoscopic exam reveals clear margins bilaterally. Extraocular muscles are intact. The visual fields are full to confrontational testing. The speech is fluent and clear. Soft palate rises symmetrically and there is no tongue deviation. Hearing is intact to conversational tone. Sensation: Sensation is intact to light and pinprick throughout (facial, trunk, extremities). Vibration is intact at the bilateral big toe. There is no extinction with double simultaneous stimulation. There is no sensory dermatomal level identified. Motor: Strength is 5/5 in the bilateral upper and lower extremities.   Shoulder shrug is equal and symmetric.  There is no pronator drift. Deep tendon reflexes: Deep tendon reflexes are 0/4 at the bilateral biceps, triceps, brachioradialis, patella and achilles. Plantar responses are neutral bilaterally.  Movement examination: Tone: There is normal tone in the bilateral upper extremities.  The tone in the lower extremities is normal.  Abnormal movements: The patient does have an intermittent right upper extremity resting tremor.  When distracted, she does have a tremor in the left upper extremity as well.  She has very mild dyskinesia, more notable on the left. Coordination:  There is significant decremation with RAM's, seen bilaterally but much more pronounced in the left hemibody, Including alternating supination and pronation of the forearm, hand opening and closing, finger taps, heel taps and toe taps. Gait and Station: The patient is unable to arise out of the chair without use of her  hands.  Even with her hands, she has difficulty getting up.  Her left foot sticks to the floor.  She cannot even take a step without maximal assistance.    ASSESSMENT/PLAN:  1.  Parkinsonism.  -It is certainly possible that this represents idiopathic Parkinson's disease, but I am also considering the possibility of MSA.  She certainly has very early dyskinesias for such a low dose of levodopa.  She has also not noticed a robust response to her medications, and is not even sure that the Requip is working.  -Because of prominent hallucinations and also because of lower extremity edema, I think it is time to decrease the dosage of her Requip.  She is currently on Requip, 3 mg 3 times a day and I will decrease to 2 mg 3 times a day.  Risks, benefits, side effects and alternative therapies were discussed.  The opportunity to ask questions was given and they were answered to the best of my ability.  The patient expressed understanding and willingness to follow the outlined treatment protocols.  -For now, I am not going to change her Stalevo.  She may need an increase in his decrease the Requip, but she already has some dyskinesia.  I am going to change the timing, because she is currently taking the last visit at bedtime.  I told her to take the medication on an empty stomach before meals 3 times a day.  Risks, benefits, side effects and alternative therapies were discussed.  The opportunity to ask questions was given and they were answered to the best of my ability.  The patient expressed understanding and willingness to follow the outlined treatment protocols.  -I. would very much like to see the patient involved with the Parkinson's Wellness recovery program.  I was hoping to have her involved with the PT and OT associated with these programs, but she is very resistant, primarily because she has gotten emotionally attached to her  caregivers at home.  While at home therapy is certainly good, I would like to see  her involved with Parkinson's specific therapies.  She did not want to be referred, but was agreeable to attend at least one of the monthly PWR sessions.  -I am going to refer the patient for a wheelchair evaluation.  Her current wheelchair is far too small for her and she cannot maneuver it.  -I. gave her patient education today.  I gave her literature on the [facility] as well as patient education regarding Parkinson's disease. 2.  Depression.  -I. think this is the biggest thing that is going to hinder her wellness.  Again, she does not want to go out of the house for therapies.  I am hoping to readdress this with her in the future.  She is not suicidal or homicidal. 3.  I plan on seeing her back in 6 weeks, sooner should new neurologic issues arise. 4.  time in room with the patient today was 80 minutes with greater than 50% in counseling, as above.

## 2012-09-22 NOTE — Patient Instructions (Addendum)
Prescription for requip 2mg  one tab three times a day sent to your pharmacy.  Take the carbidopa/levodopa before breakfast, lunch and dinner.  PT will be calling you for a wheelchair evaluation.  Contact [facility] regarding exercise.  We will see you back in six weeks.

## 2012-10-13 ENCOUNTER — Ambulatory Visit: Payer: Medicare Other | Attending: Neurology | Admitting: Physical Therapy

## 2012-10-15 LAB — PROTIME-INR

## 2012-11-05 ENCOUNTER — Ambulatory Visit (INDEPENDENT_AMBULATORY_CARE_PROVIDER_SITE_OTHER): Payer: Medicare Other | Admitting: Neurology

## 2012-11-05 ENCOUNTER — Encounter: Payer: Self-pay | Admitting: Neurology

## 2012-11-05 VITALS — BP 112/72 | HR 76 | Temp 97.8°F | Resp 20

## 2012-11-05 DIAGNOSIS — G20 Parkinson's disease: Secondary | ICD-10-CM | POA: Insufficient documentation

## 2012-11-05 DIAGNOSIS — F329 Major depressive disorder, single episode, unspecified: Secondary | ICD-10-CM

## 2012-11-05 MED ORDER — CARBIDOPA-LEVODOPA-ENTACAPONE 25-100-200 MG PO TABS: 1.0000 | ORAL_TABLET | Freq: Four times a day (QID) | ORAL | Status: DC

## 2012-11-05 NOTE — Progress Notes (Signed)
Christy Pham was seen today in the movement disorders clinic for neurologic follow up regarding parkinsonism.     This patient is accompanied in the office by her spouse  Christy Pham) who supplements the history.    The last note that I have from Dr Anne Hahn was May, 2013.  The patient was seen by Dr. Thad Ranger at [facility] prior to Dr. Anne Hahn.  She has had symptoms ever since 2001.  The patient reports first sx was tremor in the R hand that was intermittent.  She is R hand dominant.  She now rarely has tremor in the L hand and has had that for about 2 years.  She believes that her first drug was requip.  She cannot remember the year and is unsure if the requip was or is helpful.  Several years later, she started on levodopa.  It was beneficial but not dramatic.  She was changed to stalevo in May 2013.  She is unsure if the change was helpful.    Currently, the patient is on Stalevo 100 mg 3 times a day.  She is on Requip 3 mg 3 times a day.  She was previously on Artane.  11/05/12  Last visit, I decreased her Requip to 2 mg 3 times a day because of hallucinations and LE edema.  She has had less hallucinations but states that she "sort of misses the people."   She states that the "people in the backyard have left."  She still has some visual hallucinations, but most of that is seeing faces in trees.   She is on Stalevo 100 tid.  She was to change the timing of her stalevo last visit but she is still taking it at 9:30am, 4:30pm and 9:30 pm and she awakens at 6:30 am and goes to bed at 10pm. She is still doing home PT and is working on transferring from the hover round to the manual wheelchair.  She doesn't want to go to the neurorehab center because she is emotionally attached to her therapists.  Overall the pt and her husband agree that she is doing better  Wearing off:  no  How long before next dose:  N/a (cannot tell when stalevo kicks in  Or when it wears off) Falls:   yes (one time since last visit....during a  transfer and slid down) N/V:  no Hallucinations:  yes  visual distortions: yes Lightheaded:  no  Syncope: no Dyskinesia:  yes  Neuroimaging has previously been performed.  It is not available for my review today.    2004 MRI report: CLINICAL DATA: TREMORS ON RIGHT SIDE.  MRI OF THE BRAIN WITHOUT AND WITH CONTRAST  NO COMPARISON.  THERE IS MILD BRAIN ATROPHY. THERE IS A SMALL HYPERINTENSITY IN THE POSTERIOR LIMB OF THE INTERNAL  CAPSULE ON THE LEFT WHICH APPEARS TO BE AN OLD INFARCT. THE REMAINDER OF THE WHITE MATTER APPEARS  NORMAL. THE BRAINSTEM APPEARS NORMAL. THERE IS NO MASS OR ACUTE INFARCT. THERE IS NO PATHOLOGIC  ENHANCEMENT.  IMPRESSION  NO ACUTE ABNORMALITY.   PREVIOUS MEDICATIONS: Sinemet, Requip and artane  ALLERGIES:  No Known Allergies  CURRENT MEDICATIONS:  Current Outpatient Prescriptions on File Prior to Visit  Medication Sig Dispense Refill  . ALPRAZolam (XANAX) 0.25 MG tablet Take 0.25 mg by mouth 3 (three) times daily as needed. For anxiety      . carbidopa-levodopa (SINEMET) 25-100 MG per tablet Take 1 tablet by mouth 3 (three) times daily.      Marland Kitchen  digoxin (LANOXIN) 0.25 MG tablet Take 250 mcg by mouth daily.      Marland Kitchen FLUoxetine (PROZAC) 40 MG capsule Take 40 mg by mouth daily.      . metoprolol tartrate (LOPRESSOR) 25 MG tablet Take 12.5 mg by mouth 2 (two) times daily.      Marland Kitchen rOPINIRole (REQUIP) 2 MG tablet tid  270 tablet  3  . simvastatin (ZOCOR) 40 MG tablet Take 40 mg by mouth every evening.      . warfarin (COUMADIN) 5 MG tablet Take 1 tablet (5 mg total) by mouth daily. Take 1 (5 mg) tablet by mouth daily.      . carbidopa-levodopa-entacapone (STALEVO) 25-100-200 MG per tablet ONE TABLET THREE TIMES A DAY  90 tablet  5   No current facility-administered medications on file prior to visit.    PAST MEDICAL HISTORY:   Past Medical History  Diagnosis Date  . Parkinson's disease     dx circa 1990 by Dr. Thad Ranger  . Hyperlipidemia     dx circa 2008   . Hypertension     dx circa 2008  . Osteoarthritis     L knee primarily  . Atrial fibrillation     PAST SURGICAL HISTORY:   Past Surgical History  Procedure Laterality Date  . Tubal ligation      circa 1983  . Tonsillectomy      circa 1950  . Colonoscopy      2006 by Jeani Hawking (clear per pt)    SOCIAL HISTORY:   History   Social History  . Marital Status: Married    Spouse Name: N/A    Number of Children: 1  . Years of Education: 16   Occupational History  . 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   Social History Main Topics  . Smoking status: Never Smoker   . Smokeless tobacco: Never Used  . Alcohol Use: Yes     Comment: 3-4 times per week (brandy, occ wine)  . Drug Use: No  . Sexually Active: Not Currently   Other Topics Concern  . Not on file   Social History Narrative  . No narrative on file    FAMILY HISTORY:   Family Status  Relation Status Death Age  . Father Deceased 70    AAA  . Mother Deceased 46    "old age"  . Sister Alive 18    healthy  . Child Alive     1 son, healthy    ROS:  A complete 10 system review of systems was obtained and was unremarkable apart from what is mentioned above.  PHYSICAL EXAMINATION:    VITALS:   Filed Vitals:   11/05/12 0907  BP: 112/72  Pulse: 76  Temp: 97.8 F (36.6 C)  Resp: 20    GEN:  The patient appears stated age and is in NAD. HEENT:  Normocephalic, atraumatic.  The mucous membranes are moist. The superficial temporal arteries are without ropiness or tenderness. CV:  Irregular. Lungs:  CTAB Neck/HEME:  There are no carotid bruits bilaterally. Exts:  There is sig LE edema  Neurological examination:  Orientation: The patient is alert and oriented x3. Fund of knowledge is appropriate.  Recent and remote memory are intact.  Attention and concentration are normal.    Able to name objects and repeat phrases. Cranial nerves: There is  good facial symmetry. Pupils are equal round and reactive to light  bilaterally. Fundoscopic exam reveals clear margins bilaterally. Extraocular muscles are intact. The visual fields are full to confrontational testing. The speech is fluent and clear. Soft palate rises symmetrically and there is no tongue deviation. Hearing is intact to conversational tone. Sensation: Sensation is intact to light and pinprick throughout (facial, trunk, extremities). Vibration is intact at the bilateral big toe. There is no extinction with double simultaneous stimulation. There is no sensory dermatomal level identified. Motor: Strength is 5/5 in the bilateral upper and lower extremities.   Shoulder shrug is equal and symmetric.  There is no pronator drift. Deep tendon reflexes: Deep tendon reflexes are 0/4 at the bilateral biceps, triceps, brachioradialis, patella and achilles. Plantar responses are neutral bilaterally.  Movement examination: Tone: There is just slight increase in tone in the RUE but the LUE is normal.  The tone in the lower extremities is normal.  Abnormal movements: The patient does have a near constant right upper extremity resting tremor and RLE tremor.  There is some tremor in the LLE as well.  She has no dyskinesia today. Coordination:  There is significant decremation with RAM's, seen bilaterally but much more pronounced in the left hemibody, Including alternating supination and pronation of the forearm, hand opening and closing, finger taps, heel taps and toe taps. Gait and Station: The patient is unable to arise out of the chair without use of her hands.  Even with her hands, she has difficulty getting up.  She requires max assistance today.  ASSESSMENT/PLAN:  1.  Parkinsonism.  -It is certainly possible that this represents idiopathic Parkinson's disease, but I am also considering the possibility of MSA.  She certainly has very early dyskinesias for such a low dose of levodopa (none seen today  but she hasn't taken med yet).  She has also not noticed a robust response to her medications, and is not even sure that the Requip is working.  -Because of prominent hallucinations and also because of lower extremity edema, I think it is time to decrease the dosage of her Requip.  She will go to 1/2 tab tid of the requip, 2mg .  We are planning on weaning this completely off..  Risks, benefits, side effects and alternative therapies were discussed.  The opportunity to ask questions was given and they were answered to the best of my ability.  The patient expressed understanding and willingness to follow the outlined treatment protocols.  -Increase the stalevo to 4 times per day and take it at 7am/11am/3pm/7pm.  Risks, benefits, side effects and alternative therapies were discussed.  The opportunity to ask questions was given and they were answered to the best of my ability.  The patient expressed understanding and willingness to follow the outlined treatment protocols.  -I want to see her get in daily exercise and we discussed safe exercise.  2.  Depression.  -She is willing to be referred to Dr. Dellia Cloud for possible CBT. She is not suicidal or homicidal. 3.  I plan on seeing her back in 3 months, sooner should new neurologic issues arise. 4.  time in room with the patient today was 40 minutes with greater than 50% in counseling, as above.

## 2012-11-05 NOTE — Patient Instructions (Addendum)
1.  Decrease requip to 2mg  - 1/2 tablet three times per day 2.  Increase stalevo and take on the following schedule:  7am/11am/3pm/7pm 3.  Exercise daily 4.  We are referring you to Dr. Caralyn Guile

## 2012-11-24 ENCOUNTER — Ambulatory Visit (INDEPENDENT_AMBULATORY_CARE_PROVIDER_SITE_OTHER): Payer: Medicare Other | Admitting: Psychology

## 2012-11-24 DIAGNOSIS — F411 Generalized anxiety disorder: Secondary | ICD-10-CM

## 2012-11-24 DIAGNOSIS — F331 Major depressive disorder, recurrent, moderate: Secondary | ICD-10-CM

## 2012-12-08 ENCOUNTER — Ambulatory Visit: Payer: Medicare Other | Admitting: Psychology

## 2012-12-15 ENCOUNTER — Ambulatory Visit (INDEPENDENT_AMBULATORY_CARE_PROVIDER_SITE_OTHER): Payer: Medicare Other | Admitting: Psychology

## 2012-12-15 DIAGNOSIS — F331 Major depressive disorder, recurrent, moderate: Secondary | ICD-10-CM

## 2012-12-15 DIAGNOSIS — F411 Generalized anxiety disorder: Secondary | ICD-10-CM

## 2012-12-26 ENCOUNTER — Ambulatory Visit: Payer: Medicare Other | Admitting: Psychology

## 2012-12-29 ENCOUNTER — Ambulatory Visit (INDEPENDENT_AMBULATORY_CARE_PROVIDER_SITE_OTHER): Payer: Medicare Other | Admitting: Psychology

## 2012-12-29 DIAGNOSIS — F331 Major depressive disorder, recurrent, moderate: Secondary | ICD-10-CM

## 2012-12-29 DIAGNOSIS — F411 Generalized anxiety disorder: Secondary | ICD-10-CM

## 2013-01-09 ENCOUNTER — Ambulatory Visit: Payer: Medicare Other | Admitting: Psychology

## 2013-01-13 ENCOUNTER — Ambulatory Visit (INDEPENDENT_AMBULATORY_CARE_PROVIDER_SITE_OTHER): Payer: Medicare Other | Admitting: Psychology

## 2013-01-13 DIAGNOSIS — F331 Major depressive disorder, recurrent, moderate: Secondary | ICD-10-CM

## 2013-01-13 DIAGNOSIS — F411 Generalized anxiety disorder: Secondary | ICD-10-CM

## 2013-01-28 ENCOUNTER — Ambulatory Visit: Payer: Medicare Other | Admitting: Psychology

## 2013-02-09 ENCOUNTER — Ambulatory Visit (INDEPENDENT_AMBULATORY_CARE_PROVIDER_SITE_OTHER): Payer: Medicare Other | Admitting: Psychology

## 2013-02-09 DIAGNOSIS — F411 Generalized anxiety disorder: Secondary | ICD-10-CM

## 2013-02-09 DIAGNOSIS — F331 Major depressive disorder, recurrent, moderate: Secondary | ICD-10-CM

## 2013-02-11 ENCOUNTER — Ambulatory Visit (INDEPENDENT_AMBULATORY_CARE_PROVIDER_SITE_OTHER): Payer: Medicare Other | Admitting: Neurology

## 2013-02-11 ENCOUNTER — Encounter: Payer: Self-pay | Admitting: Neurology

## 2013-02-11 VITALS — BP 120/80 | HR 60 | Temp 98.3°F | Resp 16

## 2013-02-11 DIAGNOSIS — G238 Other specified degenerative diseases of basal ganglia: Secondary | ICD-10-CM

## 2013-02-11 DIAGNOSIS — F329 Major depressive disorder, single episode, unspecified: Secondary | ICD-10-CM

## 2013-02-11 NOTE — Patient Instructions (Addendum)
1.  Hold your stalevo and your requip the day before you come in for your visit next week 2.  Please schedule follow up on 9/23 at 9:45 and 10:45 on your way out today. Thank you!

## 2013-02-11 NOTE — Progress Notes (Signed)
Christy Pham was seen today in the movement disorders clinic for neurologic follow up regarding parkinsonism.     This patient is accompanied in the office by her spouse  Christy Pham) who supplements the history.    The last note that I have from Dr Anne Hahn was May, 2013.  The patient was seen by Dr. Thad Ranger at [facility] prior to Dr. Anne Hahn.  She has had symptoms ever since 2001.  The patient reports first sx was tremor in the R hand that was intermittent.  She is R hand dominant.  She now rarely has tremor in the L hand and has had that for about 2 years.  She believes that her first drug was requip.  She cannot remember the year and is unsure if the requip was or is helpful.  Several years later, she started on levodopa.  It was beneficial but not dramatic.  She was changed to stalevo in May 2013.  She is unsure if the change was helpful.    Currently, the patient is on Stalevo 100 mg 3 times a day.  She is on Requip 3 mg 3 times a day.  She was previously on Artane.  11/05/12  Last visit, I decreased her Requip to 2 mg 3 times a day because of hallucinations and LE edema.  She has had less hallucinations but states that she "sort of misses the people."   She states that the "people in the backyard have left."  She still has some visual hallucinations, but most of that is seeing faces in trees.   She is on Stalevo 100 tid.  She was to change the timing of her stalevo last visit but she is still taking it at 9:30am, 4:30pm and 9:30 pm and she awakens at 6:30 am and goes to bed at 10pm. She is still doing home PT and is working on transferring from the hover round to the manual wheelchair.  She doesn't want to go to the neurorehab center because she is emotionally attached to her therapists.  Overall the pt and her husband agree that she is doing better  02/11/13 update:  Last visit, I decreased the patient's Requip because of hallucinations.  She no longer has "visitors" in the backyard and "I kind of miss  those." Her Stalevo was increased to 100 mg 4 times per day.  Her husband feels that her medication is "erratic" and sometimes he notices when it helps and other times he is not so sure. I referred her to Dr. Dellia Cloud last visit, and she reports that she has been to see him several times.  She is not sure if this helped, but her husband states that the patient "likes talking to him."  There is still significant difficulty with transfers.  She had 1 fall similar to her previous one where she slipped down between a chair.  This is the only fall that she is, but she does not walk much.  She relies completely on her husband to help transfer.  This has become somewhat more difficult.  She did get a scooter and he modified that to make it more comfortable, but she still has difficulty getting on and off.  Wearing off:  no  How long before next dose:  N/a (cannot tell when stalevo kicks in  Or when it wears off) Falls:   yes  N/V:  no Hallucinations:  no  visual distortions: yes Lightheaded:  no  Syncope: no Dyskinesia:  yes  Neuroimaging has previously  been performed.  It is not available for my review today.    2004 MRI report: CLINICAL DATA: TREMORS ON RIGHT SIDE.  MRI OF THE BRAIN WITHOUT AND WITH CONTRAST  NO COMPARISON.  THERE IS MILD BRAIN ATROPHY. THERE IS A SMALL HYPERINTENSITY IN THE POSTERIOR LIMB OF THE INTERNAL  CAPSULE ON THE LEFT WHICH APPEARS TO BE AN OLD INFARCT. THE REMAINDER OF THE WHITE MATTER APPEARS  NORMAL. THE BRAINSTEM APPEARS NORMAL. THERE IS NO MASS OR ACUTE INFARCT. THERE IS NO PATHOLOGIC  ENHANCEMENT.  IMPRESSION  NO ACUTE ABNORMALITY.   PREVIOUS MEDICATIONS: Sinemet, Requip and artane  ALLERGIES:  No Known Allergies  CURRENT MEDICATIONS:  Current Outpatient Prescriptions on File Prior to Visit  Medication Sig Dispense Refill  . ALPRAZolam (XANAX) 0.25 MG tablet Take 0.25 mg by mouth 3 (three) times daily as needed. For anxiety      . carbidopa-levodopa  (SINEMET) 25-100 MG per tablet Take 1 tablet by mouth 3 (three) times daily.      . carbidopa-levodopa-entacapone (STALEVO) 25-100-200 MG per tablet Take 1 tablet by mouth 4 (four) times daily.  360 tablet  1  . digoxin (LANOXIN) 0.25 MG tablet Take 250 mcg by mouth daily.      Marland Kitchen FLUoxetine (PROZAC) 40 MG capsule Take 40 mg by mouth daily.      . metoprolol tartrate (LOPRESSOR) 25 MG tablet Take 12.5 mg by mouth 2 (two) times daily.      Marland Kitchen rOPINIRole (REQUIP) 2 MG tablet tid  270 tablet  3  . simvastatin (ZOCOR) 40 MG tablet Take 40 mg by mouth every evening.      . warfarin (COUMADIN) 5 MG tablet Take 1 tablet (5 mg total) by mouth daily. Take 1 (5 mg) tablet by mouth daily.       No current facility-administered medications on file prior to visit.    PAST MEDICAL HISTORY:   Past Medical History  Diagnosis Date  . Parkinson's disease     dx circa 1990 by Dr. Thad Ranger  . Hyperlipidemia     dx circa 2008  . Hypertension     dx circa 2008  . Osteoarthritis     L knee primarily  . Atrial fibrillation     PAST SURGICAL HISTORY:   Past Surgical History  Procedure Laterality Date  . Tubal ligation      circa 1983  . Tonsillectomy      circa 1950  . Colonoscopy      2006 by Jeani Hawking (clear per pt)    SOCIAL HISTORY:   History   Social History  . Marital Status: Married    Spouse Name: N/A    Number of Children: 1  . Years of Education: 16   Occupational History  . 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   Social History Main Topics  . Smoking status: Never Smoker   . Smokeless tobacco: Never Used  . Alcohol Use: Yes     Comment: 3-4 times per week (brandy, occ wine)  . Drug Use: No  . Sexual Activity: Not Currently   Other Topics Concern  . Not on file   Social History Narrative  . No narrative on file    FAMILY HISTORY:   Family Status  Relation Status Death Age  . Father Deceased 13    AAA  .  Mother Deceased 15    "old age"  . Sister Alive  62    healthy  . Child Alive     1 son, healthy    ROS:  A complete 10 system review of systems was obtained and was unremarkable apart from what is mentioned above.  PHYSICAL EXAMINATION:    VITALS:   There were no vitals filed for this visit.  GEN:  The patient appears stated age and is in NAD. HEENT:  Normocephalic, atraumatic.  The mucous membranes are moist. The superficial temporal arteries are without ropiness or tenderness. CV:  Irregular. Lungs:  CTAB Neck/HEME:  There are no carotid bruits bilaterally. Exts:  There is sig LE edema  Neurological examination:  Orientation: The patient is alert and oriented x3. Fund of knowledge is appropriate.  Recent and remote memory are intact.  Attention and concentration are normal.    Able to name objects and repeat phrases. Cranial nerves: There is good facial symmetry. Pupils are equal round and reactive to light bilaterally. Fundoscopic exam reveals clear margins bilaterally. Extraocular muscles are intact. The visual fields are full to confrontational testing. The speech is fluent and clear. Soft palate rises symmetrically and there is no tongue deviation. Hearing is intact to conversational tone. Sensation: Sensation is intact to light and pinprick throughout (facial, trunk, extremities). Vibration is intact at the bilateral big toe. There is no extinction with double simultaneous stimulation. There is no sensory dermatomal level identified. Motor: Strength is 5/5 in the bilateral upper and lower extremities.   Shoulder shrug is equal and symmetric.  There is no pronator drift. Deep tendon reflexes: Deep tendon reflexes are 0/4 at the bilateral biceps, triceps, brachioradialis, patella and achilles. Plantar responses are neutral bilaterally.  Movement examination: Tone: There is just slight increase in tone in the RUE but the LUE is normal.  The tone in the lower extremities is normal.   Abnormal movements: The patient does have a near constant right upper extremity resting tremor and RLE tremor.  There is some tremor in the LLE as well.  She has no dyskinesia today. Coordination:  There is significant decremation with RAM's, seen bilaterally but much more pronounced in the left hemibody, Including alternating supination and pronation of the forearm, hand opening and closing, finger taps, heel taps and toe taps. Gait and Station: The patient is unable to arise out of the chair without use of her hands.  Even with her hands, she has difficulty getting up.  She requires max assistance today.  ASSESSMENT/PLAN:  1.  Parkinsonism.  -It is certainly possible that this represents idiopathic Parkinson's disease, but I am also considering the possibility of MSA.  She certainly has very early dyskinesias for such a low dose of levodopa (none seen today).  She has also not noticed a robust response to her medications, and is not even sure that the Requip is working.  Hallucinations are markedly better with the decreased dosage and Requip.  I will likely continue to wean this.  -I think that she needs more carbidopa/levodopa independently of her Stalevo which she is taking at 7 AM/11 AM/3 PM/7pm, but she is not sure of the levodopa is working.  Therefore, I am going to have her come to the office off of her medications in a few weeks, do an on/off test and then retest her.  -  I. think she would benefit greatly from inpatient rehabilitation.  She has been very resistant to any type of rehabilitation besides home health, because she has developed an emotional attachment to her therapist from  home health company.  I asked her if I could refer her to physiatry, but she does not want to do that.  I asked her please think about it, as I think she needs more intensive therapy just to be of help herself transfer. 2.  Depression.  -She is following with Dr. Marquette Saa. She is not suicidal or  homicidal.

## 2013-02-17 ENCOUNTER — Ambulatory Visit (INDEPENDENT_AMBULATORY_CARE_PROVIDER_SITE_OTHER): Payer: Medicare Other | Admitting: Neurology

## 2013-02-17 VITALS — BP 130/78 | HR 60 | Temp 98.2°F | Resp 12

## 2013-02-17 DIAGNOSIS — F329 Major depressive disorder, single episode, unspecified: Secondary | ICD-10-CM

## 2013-02-17 DIAGNOSIS — R269 Unspecified abnormalities of gait and mobility: Secondary | ICD-10-CM

## 2013-02-17 DIAGNOSIS — G219 Secondary parkinsonism, unspecified: Secondary | ICD-10-CM

## 2013-02-17 MED ORDER — CARBIDOPA-LEVODOPA 25-100 MG PO TABS
3.0000 | ORAL_TABLET | Freq: Three times a day (TID) | ORAL | Status: DC
  Administered 2013-02-17: 3 via ORAL

## 2013-02-17 MED ORDER — CARBIDOPA-LEVODOPA 25-100 MG PO TABS: 1.0000 | ORAL_TABLET | Freq: Two times a day (BID) | ORAL | Status: DC

## 2013-02-17 MED ORDER — CARBIDOPA-LEVODOPA 25-100 MG PO TABS: 3.0000 | ORAL_TABLET | Freq: Once | ORAL | Status: DC

## 2013-02-17 NOTE — Patient Instructions (Addendum)
1. Make a follow up appointment for January 2015 on your way out today. Thank You!

## 2013-02-17 NOTE — Patient Instructions (Signed)
1.  Take your stalevo that you have at home at 7am/11am/3pm/7pm 2.  Add the carbidopa/levodopa (new RX) at 9 am and 1 pm 3.  Today skip your 11 am dose and start taking the new med at 1 pm

## 2013-02-17 NOTE — Progress Notes (Signed)
Christy Pham was seen today in the movement disorders clinic for neurologic follow up regarding parkinsonism.     This patient is accompanied in the office by her spouse  Christy Pham(Christy Pham) who supplements the history.    The last note that I have from [name] was May, 2013.  The patient was [facility] by [name] prior to Dr. Anne HahnWillis.  She has had symptoms ever since 2001.  The patient reports first sx was tremor in the R hand that was intermittent.  She is R hand dominant.  She now rarely has tremor in the L hand and has had that for about 2 years.  She believes that her first drug was requip.  She cannot remember the year and is unsure if the requip was or is helpful.  Several years later, she started on levodopa.  It was beneficial but not dramatic.  She was changed to stalevo in May 2013.  She is unsure if the change was helpful.    Currently, the patient is on Stalevo 100 mg 3 times a day.  She is on Requip 3 mg 3 times a day.  She was previously on Artane.  11/05/12  Last visit, I decreased her Requip to 2 mg 3 times a day because of hallucinations and LE edema.  She has had less hallucinations but states that she "sort of misses the people."   She states that the "people in the backyard have left."  She still has some visual hallucinations, but most of that is seeing faces in trees.   She is on Stalevo 100 tid.  She was to change the timing of her stalevo last visit but she is still taking it at 9:30am, 4:30pm and 9:30 pm and she awakens at 6:30 am and goes to bed at 10pm. She is still doing home PT and is working on transferring from the hover round to the manual wheelchair.  She doesn't want to go to the neurorehab center because she is emotionally attached to her therapists.  Overall the pt and her husband agree that she is doing better  02/11/13 update:  Last visit, I decreased the patient's Requip because of hallucinations.  She no longer has "visitors" in the backyard and "I kind of miss  those." Her Stalevo was increased to 100 mg 4 times per day.  Her husband feels that her medication is "erratic" and sometimes he notices when it helps and other times he is not so sure. I referred her to [name] last visit, and she reports that she has been to see him several times.  She is not sure if this helped, but her husband states that the patient "likes talking to him."  There is still significant difficulty with transfers.  She had 1 fall similar to her previous one where she slipped down between a chair.  This is the only fall that she is, but she does not walk much.  She relies completely on her husband to help transfer.  This has become somewhat more difficult.  She did get a scooter and he modified that to make it more comfortable, but she still has difficulty getting on and off.  02/17/13 update:  Pt presents today for on/off testing.  She has been off all med (was told to d/c levodopa but remain on other meds) for over 24 hours.  She feels more stiff and shaky since being off of the medication.  No falls.  Does not wish to consider  going to more intensive rehabilitation therapy at least until January.  She has had significant difficulty transferring.  No hallucinations.  .   2004 MRI report: CLINICAL DATA: TREMORS ON RIGHT SIDE.  MRI OF THE BRAIN WITHOUT AND WITH CONTRAST  NO COMPARISON.  THERE IS MILD BRAIN ATROPHY. THERE IS A SMALL HYPERINTENSITY IN THE POSTERIOR LIMB OF THE INTERNAL  CAPSULE ON THE LEFT WHICH APPEARS TO BE AN OLD INFARCT. THE REMAINDER OF THE WHITE MATTER APPEARS  NORMAL. THE BRAINSTEM APPEARS NORMAL. THERE IS NO MASS OR ACUTE INFARCT. THERE IS NO PATHOLOGIC  ENHANCEMENT.  IMPRESSION  NO ACUTE ABNORMALITY.   PREVIOUS MEDICATIONS: Sinemet, Requip and artane  ALLERGIES:  No Known Allergies  CURRENT MEDICATIONS:  Current Outpatient Prescriptions on File Prior to Visit  Medication Sig Dispense Refill  . ALPRAZolam (XANAX) 0.25 MG tablet Take 0.25 mg by  mouth 3 (three) times daily as needed. For anxiety      . carbidopa-levodopa-entacapone (STALEVO) 25-100-200 MG per tablet Take 1 tablet by mouth 4 (four) times daily.  360 tablet  1  . digoxin (LANOXIN) 0.25 MG tablet Take 250 mcg by mouth daily.      Marland Kitchen FLUoxetine (PROZAC) 40 MG capsule Take 40 mg by mouth daily.      . metoprolol tartrate (LOPRESSOR) 25 MG tablet Take 12.5 mg by mouth 2 (two) times daily.      Marland Kitchen rOPINIRole (REQUIP) 2 MG tablet tid  270 tablet  3  . simvastatin (ZOCOR) 40 MG tablet Take 40 mg by mouth every evening.      . warfarin (COUMADIN) 5 MG tablet Take 1 tablet (5 mg total) by mouth daily. Take 1 (5 mg) tablet by mouth daily.       No current facility-administered medications on file prior to visit.    PAST MEDICAL HISTORY:   Past Medical History  Diagnosis Date  . Parkinson's disease     dx circa 1990 by Dr. Thad Ranger  . Hyperlipidemia     dx circa 2008  . Hypertension     dx circa 2008  . Osteoarthritis     L knee primarily  . Atrial fibrillation     PAST SURGICAL HISTORY:   Past Surgical History  Procedure Laterality Date  . Tubal ligation      circa 1983  . Tonsillectomy      circa 1950  . Colonoscopy      2006 by Jeani Hawking (clear per pt)    SOCIAL HISTORY:   History   Social History  . Marital Status: Married    Spouse Name: N/A    Number of Children: 1  . Years of Education: 16   Occupational History  . 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   Social History Main Topics  . Smoking status: Never Smoker   . Smokeless tobacco: Never Used  . Alcohol Use: Yes     Comment: 3-4 times per week (brandy, occ wine)  . Drug Use: No  . Sexual Activity: Not Currently   Other Topics Concern  . Not on file   Social History Narrative  . No narrative on file    FAMILY HISTORY:   Family Status  Relation Status Death Age  . Father Deceased 22    AAA  . Mother Deceased 35     "old age"  . Sister Alive 84    healthy  . Child Alive  1 son, healthy    ROS:  A complete 10 system review of systems was obtained and was unremarkable apart from what is mentioned above.  PHYSICAL EXAMINATION:    VITALS:   Filed Vitals:   02/17/13 0942  BP: 130/78  Pulse: 60  Temp: 98.2 F (36.8 C)  Resp: 12    GEN:  The patient appears stated age and is in NAD. HEENT:  Normocephalic, atraumatic.  The mucous membranes are moist. The superficial temporal arteries are without ropiness or tenderness. CV:  Irregular. Lungs:  CTAB Neck/HEME:  There are no carotid bruits bilaterally. Exts:  There is sig LE edema  Neurological examination:  Orientation: The patient is alert and oriented x3. Fund of knowledge is appropriate.  Recent and remote memory are intact.  Attention and concentration are normal.    Able to name objects and repeat phrases. Cranial nerves: There is good facial symmetry. Pupils are equal round and reactive to light bilaterally. Fundoscopic exam reveals clear margins bilaterally. Extraocular muscles are intact. The visual fields are full to confrontational testing. The speech is fluent and clear. Soft palate rises symmetrically and there is no tongue deviation. Hearing is intact to conversational tone. Sensation: Sensation is intact to light and pinprick throughout (facial, trunk, extremities). Vibration is intact at the bilateral big toe. There is no extinction with double simultaneous stimulation. There is no sensory dermatomal level identified. Motor: Strength is 5/5 in the bilateral upper and lower extremities.   Shoulder shrug is equal and symmetric.  There is no pronator drift. Deep tendon reflexes: Deep tendon reflexes are 0/4 at the bilateral biceps, triceps, brachioradialis, patella and achilles. Plantar responses are neutral bilaterally.  Movement examination: Tone: Prior to the administration of the levodopa, there is significantly increased in the  lower extremities.  Following administration of levodopa, tone was markedly improved, but there was still increased tone in the left lower extremity. Abnormal movements: Prior to the addition of levodopa, the patient had near constant resting tremor in all extremities.  Following the addition of levodopa, there was intermittent tremor in the left upper extremity. Coordination:  There is significant decremation with RAM's, seen bilaterally but much more pronounced in the left hemibody, Including alternating supination and pronation of the forearm, hand opening and closing, finger taps, heel taps and toe taps. Gait and Station: The patient is unable to arise out of the chair without use of her hands.  Even with her hands, she has difficulty getting up.  She requires max assistance today.  UPDRS MOTOR EXAM                    OFF     ON          Speech    1     1  0-normal           1-slight loss of expression, diction,volume     2-monotone, slurred but understandable, mod. impaired           3-marked impairment, difficult to understand           4-unintelligible          Facial Expression           0-Normal     2   1  1-slight hypomymia, could be poker face           2-slight but definite abnormal diminution in expression    3-mod. hypomimia, lips parted some of time  4-masked or fixed face, lips parted 1/4 of inch or more w complete loss of expression          *Tremor at Rest            Face               0-absent     1-slight and infrequent     0      0   2-mild and present most of time              3-moderate and present most of time              4-marked and present most of time             Right Upper Extremity (RUE)               0-absent     1-slight and infrequent              2-mild and present most of time      3     0   3-moderate and present most of time              4-marked and present most of time             LUE               0-absent               1-slight and infrequent     3      1   2-mild and present most of time              3-moderate and present most of time              4-marked and present most of time             RLE               0-absent              1-slight and infrequent   2       0   2-mild and present most of time              3-moderate and present most of time              4-marked and present most of time            LLE               0-absent              1-slight and infrequent   2      0   2-mild and present most of time              3-moderate and present most of time              4-marked and present most of time          *Action or Postural Tremor             RUE              0-absent              1-slight, present with action 1          1   2-moderate, present with action  3-moderate present with action and posture holding              4-marked, interferes with feeding             LUE              0-absent  1          1   1-slight, present with action              2-moderate, present with action              3-moderate present with action and posture holding              4-marked, interferes with feeding          *Rigidity             Neck              0-absent  1          1   1-slight or only with activation              2-mild/moderate              3-marked, full range of motion              4-severe              RUE              0-absent  2          0   1-slight or only with activation              2-mild/moderate              3-marked, full range of motion              4-severe             LUE              0-absent              1-slight or only with activation  1          0   2-mild/moderate              3-marked, full range of motion              4-severe             RLE              0-absent              1-slight or only with activation              2-mild/moderate    3        1   3-marked, full range of motion              4-severe             LLE              0-absent              1-slight or only with activation              2-mild/moderate 3           2    3-marked, full range of motion              4-severe           *  Finger taps             Right              0-normal              1-mild slowing, and/or reduction in amp. 1          0   2-moderate impaired. Definite and early fatiguing, may have occasional arrests             3-severely impaired. Frequent hesitations and arrests.              4-can barely perform            Left              0-normal              1-mild slowing, and/or reduction in amp.              2-moderate impaired. Definite and early fatiguing, may have occasional arrests 3          2   3-severely impaired. Frequent hesitations and arrests.              4-can barely perform          *Hand Movements (open and close hands in rapid succession)              Right              0-normal 1        1   1-mild slowing, and/or reduction in amp.              2-moderate impaired. Definite and early fatiguing, may have occasional arrests              3-severely impaired. Frequent hesitations and arrests.              4-can barely perform             Left              0-normal              1-mild slowing, and/or reduction in amp.  2          2   2-moderate impaired. Definite and early fatiguing, may have occasional arrests              3-severely impaired. Frequent hesitations and arrests.              4-can barely perform          *Rapid Alternating Movements (pronate and supinate hands)             Right              0-normal              1-mild slowing, and/or reduction in amp.   3         3   2-moderate impaired. Definite and early fatiguing, may have occasional arrests              3-severely impaired. Frequent hesitations and arrests.              4-can barely perform             Left               0-normal              1-mild slowing, and/or reduction  in amp. 2          2   2-moderate  impaired. Definite and early fatiguing, may have occasional arrests              3-severely impaired. Frequent hesitations and arrests.              4-can barely perform         *Leg Agility (tap heel on ground, amp should be 3 inches)              Right               0-normal 2          1   1-mild slowing, and/or reduction in amp.              2-moderate impaired. Definite and early fatiguing, may have occasional arrests              3-severely impaired. Frequent hesitations and arrests.              4-can barely perform            Left               0-normal              1-mild slowing, and/or reduction in amp. 3          1   2-moderate impaired. Definite and early fatiguing, may have occasional arrests              3-severely impaired. Frequent hesitations and arrests.              4-can barely perform          *Arising From Chair (pt. arises with arms folded across chest)           0-normal           1-slow, may need more than one attempt  4       4  2-pushes self up from arms or seat           3-tends to fall back, may need multiple tries but can arise without assistance           4-unable to arise without help          *Posture           0-normal erect  2       2  1-slightly stooped, could be normal for older person           2-definitely abnormal, mod. stooped, may lean to one side           3-severely stooped with kyphosis           4-marked flexion with extreme abnormality of posture          *Gait           0-normal           1-walks slowly, may shuffle with short steps, no festination or propulsion   4      4  2-walks with difficulty, little or no assistance, some festination, short steps or propulsion           3-severe disturbance, frequent assistance           4-cannot walk          *Postural Stability (retropulsion test)           0-normal  1-recovers unaided 4        4  2-would fall if not caught           3-falls spontaneously           4-unable  to stand          *Body Bradykinesia/ Hypokinesia           0-none           1-minimal slowness, could be normal, deliberate character 4        3  2-mild slowness and poverty of movement, definitely abnormal, or dec. amp. of movement           3-moderate slowness, poverty, or small amplitude           4-marked slowness, poverty, or amplitude      UPDRS OFF SCORE:    60  UPDRS ON SCORE:       38       ASSESSMENT/PLAN:  1.  Parkinsonism.  -It is certainly possible that this represents idiopathic Parkinson's disease, but I am also considering the possibility of MSA.  We were however, able to prove that she responded very well today to the levodopa, that she currently is underdosed.    -she will continue her Stalevo which she is taking at 7 AM/11 AM/3 PM/7pm, and we will carbidopa/levodopa 25/100 at 9 AM and 1 PM.  she will continue her Requip for now.  We will need to monitor for a reemergence of hallucinations.    -  I. think she would benefit greatly from inpatient rehabilitation.  She has been very resistant to any type of rehabilitation besides home health, because she has developed an emotional attachment to her therapist from home health company.  I asked her if I could refer her to physiatry, but she does not want to do that.  she told me today that would do it in January and we will address that at that time.   2.  Depression.  -She is following with  Dr. Dellia Cloud . She is not suicidal or homicidal. 3.I will try to get a copy of lab work from [facility] in [place].   4.  I will see her back in the next few months, sooner should new neurologic issues arise.  Face-to-face time in room with the patient today was 1 hour 15 minutes.

## 2013-02-18 NOTE — Progress Notes (Signed)
error 

## 2013-02-26 ENCOUNTER — Ambulatory Visit: Payer: Medicare Other | Admitting: Psychology

## 2013-02-26 ENCOUNTER — Encounter: Payer: Self-pay | Admitting: Cardiovascular Disease

## 2013-02-26 NOTE — Progress Notes (Signed)
This encounter was created in error - please disregard.

## 2013-03-05 ENCOUNTER — Ambulatory Visit: Payer: Medicare Other | Admitting: Psychology

## 2013-03-12 ENCOUNTER — Ambulatory Visit: Payer: Medicare Other | Admitting: Psychology

## 2013-03-26 ENCOUNTER — Ambulatory Visit (INDEPENDENT_AMBULATORY_CARE_PROVIDER_SITE_OTHER): Payer: Medicare Other | Admitting: Psychology

## 2013-03-26 DIAGNOSIS — F331 Major depressive disorder, recurrent, moderate: Secondary | ICD-10-CM

## 2013-03-26 DIAGNOSIS — F411 Generalized anxiety disorder: Secondary | ICD-10-CM

## 2013-04-09 ENCOUNTER — Ambulatory Visit: Payer: Medicare Other | Admitting: Psychology

## 2013-04-15 ENCOUNTER — Ambulatory Visit: Payer: Medicare Other | Admitting: Psychology

## 2013-04-30 ENCOUNTER — Ambulatory Visit (INDEPENDENT_AMBULATORY_CARE_PROVIDER_SITE_OTHER): Payer: Medicare Other | Admitting: Psychology

## 2013-04-30 DIAGNOSIS — F331 Major depressive disorder, recurrent, moderate: Secondary | ICD-10-CM

## 2013-04-30 DIAGNOSIS — F411 Generalized anxiety disorder: Secondary | ICD-10-CM

## 2013-05-27 ENCOUNTER — Ambulatory Visit: Payer: Medicare Other | Admitting: Psychology

## 2013-06-12 ENCOUNTER — Ambulatory Visit: Payer: Medicare Other | Admitting: Psychology

## 2013-06-19 ENCOUNTER — Ambulatory Visit: Payer: Medicare Other | Admitting: Neurology

## 2013-06-22 ENCOUNTER — Ambulatory Visit (INDEPENDENT_AMBULATORY_CARE_PROVIDER_SITE_OTHER): Payer: Medicare Other | Admitting: Neurology

## 2013-06-22 ENCOUNTER — Encounter: Payer: Self-pay | Admitting: Neurology

## 2013-06-22 VITALS — BP 120/80 | HR 68 | Temp 98.8°F | Ht 64.0 in

## 2013-06-22 DIAGNOSIS — F3289 Other specified depressive episodes: Secondary | ICD-10-CM

## 2013-06-22 DIAGNOSIS — R441 Visual hallucinations: Secondary | ICD-10-CM

## 2013-06-22 DIAGNOSIS — H5316 Psychophysical visual disturbances: Secondary | ICD-10-CM

## 2013-06-22 DIAGNOSIS — F329 Major depressive disorder, single episode, unspecified: Secondary | ICD-10-CM

## 2013-06-22 DIAGNOSIS — G219 Secondary parkinsonism, unspecified: Secondary | ICD-10-CM

## 2013-06-22 MED ORDER — AMANTADINE HCL 100 MG PO CAPS: 100.0000 mg | ORAL_CAPSULE | Freq: Three times a day (TID) | ORAL | Status: DC

## 2013-06-22 NOTE — Progress Notes (Signed)
Christy Pham was seen today in the movement disorders clinic for neurologic follow up regarding parkinsonism.     This patient is accompanied in the office by her spouse  Christy Pham(Christy Pham) who supplements the history.    The last note that I have from [name] was May, 2013.  The patient was [name] to Dr. Anne HahnWillis.  She has had symptoms ever since 2001.  The patient reports first sx was tremor in the R hand that was intermittent.  She is R hand dominant.  She now rarely has tremor in the L hand and has had that for about 2 years.  She believes that her first drug was requip.  She cannot remember the year and is unsure if the requip was or is helpful.  Several years later, she started on levodopa.  It was beneficial but not dramatic.  She was changed to stalevo in May 2013.  She is unsure if the change was helpful.    Currently, the patient is on Stalevo 100 mg 3 times a day.  She is on Requip 3 mg 3 times a day.  She was previously on Artane.  11/05/12  Last visit, I decreased her Requip to 2 mg 3 times a day because of hallucinations and LE edema.  She has had less hallucinations but states that she "sort of misses the people."   She states that the "people in the backyard have left."  She still has some visual hallucinations, but most of that is seeing faces in trees.   She is on Stalevo 100 tid.  She was to change the timing of her stalevo last visit but she is still taking it at 9:30am, 4:30pm and 9:30 pm and she awakens at 6:30 am and goes to bed at 10pm. She is still doing home PT and is working on transferring from the hover round to the manual wheelchair.  She doesn't want to go to the neurorehab center because she is emotionally attached to her therapists.  Overall the pt and her husband agree that she is doing better  02/11/13 update:  Last visit, I decreased the patient's Requip because of hallucinations.  She no longer has "visitors" in the backyard and "I kind of miss  those." Her Stalevo was increased to 100 mg 4 times per day.  Her husband feels that her medication is "erratic" and sometimes he notices when it helps and other times he is not so sure. I referred her to [name] last visit, and she reports that she has been to see him several times.  She is not sure if this helped, but her husband states that the patient "likes talking to him."  There is still significant difficulty with transfers.  She had 1 fall similar to her previous one where she slipped down between a chair.  This is the only fall that she is, but she does not walk much.  She relies completely on her husband to help transfer.  This has become somewhat more difficult.  She did get a scooter and he modified that to make it more comfortable, but she still has difficulty getting on and off.  02/17/13 update:  Pt presents today for on/off testing.  She has been off all med (was told to d/c levodopa but remain on other meds) for over 24 hours.  She feels more stiff and shaky since being off of the medication.  No falls.  Does not wish to consider  going to more intensive rehabilitation therapy at least until January.  She has had significant difficulty transferring.  No hallucinations.  06/22/13 update:  Pt presents today for f/u, accompanied by her husband who supplements the hx.  The pt states that she has generally been better.  Her mind has been much more clear.  The patient still states that she misses the people she is to see in the hallucinations.  She still does have some hallucinations, but her husband states that there nothing like they used to be.  She still has the physical therapist coming on.  Her husband she is much easier transfer, but she still cannot walk.  She does state that the tremor can be very bothersome.  She wonders if going up on the levodopa can help.  She is currently on Stalevo at 7 AM/11 AM/3 PM/7 PM and carbidopa/levodopa 25/100 at 9 AM and 1 PM.  She remains on ropirinole 2  mg, half tablet 3 times per day.   2004 MRI report: CLINICAL DATA: TREMORS ON RIGHT SIDE.  MRI OF THE BRAIN WITHOUT AND WITH CONTRAST  NO COMPARISON.  THERE IS MILD BRAIN ATROPHY. THERE IS A SMALL HYPERINTENSITY IN THE POSTERIOR LIMB OF THE INTERNAL  CAPSULE ON THE LEFT WHICH APPEARS TO BE AN OLD INFARCT. THE REMAINDER OF THE WHITE MATTER APPEARS  NORMAL. THE BRAINSTEM APPEARS NORMAL. THERE IS NO MASS OR ACUTE INFARCT. THERE IS NO PATHOLOGIC  ENHANCEMENT.  IMPRESSION  NO ACUTE ABNORMALITY.   PREVIOUS MEDICATIONS: Sinemet, Requip and artane  ALLERGIES:  No Known Allergies  CURRENT MEDICATIONS:  Current Outpatient Prescriptions on File Prior to Visit  Medication Sig Dispense Refill  . ALPRAZolam (XANAX) 0.25 MG tablet Take 0.25 mg by mouth 3 (three) times daily as needed. For anxiety      . carbidopa-levodopa (SINEMET IR) 25-100 MG per tablet Take 1 tablet by mouth 2 (two) times daily.  60 tablet  5  . carbidopa-levodopa-entacapone (STALEVO) 25-100-200 MG per tablet Take 1 tablet by mouth 4 (four) times daily.  360 tablet  1  . digoxin (LANOXIN) 0.25 MG tablet Take 250 mcg by mouth daily.      Marland Kitchen FLUoxetine (PROZAC) 40 MG capsule Take 40 mg by mouth daily.      . metoprolol tartrate (LOPRESSOR) 25 MG tablet Take 12.5 mg by mouth 2 (two) times daily.      Marland Kitchen rOPINIRole (REQUIP) 2 MG tablet tid  270 tablet  3  . simvastatin (ZOCOR) 40 MG tablet Take 40 mg by mouth every evening.      . warfarin (COUMADIN) 5 MG tablet Take 1 tablet (5 mg total) by mouth daily. Take 1 (5 mg) tablet by mouth daily.       No current facility-administered medications on file prior to visit.    PAST MEDICAL HISTORY:   Past Medical History  Diagnosis Date  . Parkinson's disease     dx circa 1990 by Dr. Thad Ranger  . Hyperlipidemia     dx circa 2008  . Hypertension     dx circa 2008  . Osteoarthritis     L knee primarily  . Atrial fibrillation     PAST SURGICAL HISTORY:   Past Surgical History   Procedure Laterality Date  . Tubal ligation      circa 1983  . Tonsillectomy      circa 1950  . Colonoscopy      2006 by Jeani Hawking (clear per pt)    SOCIAL HISTORY:  History   Social History  . Marital Status: Married    Spouse Name: N/A    Number of Children: 1  . Years of Education: 16   Occupational History  . 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      clerical at [employer]   Social History Main Topics  . Smoking status: Never Smoker   . Smokeless tobacco: Never Used  . Alcohol Use: Yes     Comment: 3-4 times per week (brandy, occ wine)  . Drug Use: No  . Sexual Activity: Not Currently   Other Topics Concern  . Not on file   Social History Narrative  . No narrative on file    FAMILY HISTORY:   Family Status  Relation Status Death Age  . Father Deceased 7084    AAA  . Mother Deceased 6290    "old age"  . Sister Alive 4862    healthy  . Child Alive     1 son, healthy    ROS:  A complete 10 system review of systems was obtained and was unremarkable apart from what is mentioned above.  PHYSICAL EXAMINATION:    VITALS:   Filed Vitals:   06/22/13 1026  BP: 120/80  Pulse: 68  Temp: 98.8 F (37.1 C)  TempSrc: Oral  Height: 5\' 4"  (1.626 m)    GEN:  The patient appears stated age and is in NAD. HEENT:  Normocephalic, atraumatic.  The mucous membranes are moist. The superficial temporal arteries are without ropiness or tenderness. CV:  Irregular. Lungs:  CTAB Neck/HEME:  There are no carotid bruits bilaterally. Exts:  There is LE edema  Neurological examination:  Orientation: The patient is alert and oriented x3. Fund of knowledge is appropriate.  Recent and remote memory are intact.  Attention and concentration are normal.    Able to name objects and repeat phrases. Cranial nerves: There is good facial symmetry. Pupils are equal round and reactive to light bilaterally. Fundoscopic exam reveals clear margins  bilaterally. Extraocular muscles are intact. The visual fields are full to confrontational testing. The speech is fluent and clear. Soft palate rises symmetrically and there is no tongue deviation. Hearing is intact to conversational tone. Sensation: Sensation is intact to light and pinprick throughout (facial, trunk, extremities). Vibration is intact at the bilateral big toe. There is no extinction with double simultaneous stimulation. There is no sensory dermatomal level identified. Motor: Strength is 5/5 in the bilateral upper and lower extremities.   Shoulder shrug is equal and symmetric.  There is no pronator drift. Deep tendon reflexes: Deep tendon reflexes are 0/4 at the bilateral biceps, triceps, brachioradialis, patella and achilles. Plantar responses are neutral bilaterally.  Movement examination: Tone: Normal tone in the bilateral upper extremities today.  Abnormal movements: There is a moderate upper extremity resting tremor bilaterally. Coordination:  There is moderate decremation with RAM's, seen bilaterally but much more pronounced in the left hemibody, Including alternating supination and pronation of the forearm, hand opening and closing, finger taps, heel taps and toe taps. Gait and Station: The patient is unable to arise out of the chair without use of her hands.  Even with her hands, she has difficulty getting up.      ASSESSMENT/PLAN:  1.  Parkinsonism.  -It is certainly possible that this represents idiopathic Parkinson's disease, but I am also considering the possibility of MSA.    -she will continue her Stalevo which she is taking at 7 AM/11 AM/3 PM/7pm,  and carbidopa/levodopa 25/100 at 9 AM and 1 PM.    -I am going to wean her off of the Requip over the next few weeks.  -  I. think she would benefit greatly from inpatient rehabilitation.  She has been very resistant to any type of rehabilitation besides home health, because she has developed an emotional attachment to her  therapist from home health company.  I asked her if I could refer her to physiatry, but she does not want to do that.  she told me previously that would do it in January and we will address that at that time.  She did not want to do that today.  In addition, I asked her about trying to get her into water therapy to try to strengthen the legs, as I think it is deconditioning that is preventing her from being able to walk.  She did not want to do that as she wanted to continue to work with her physical therapist at home.  -I will add amantadine tid to see if any benefit for tremor.  So many of the good tremor drugs can further cognitive dysfunction  -Unfortunately, I do not think that she is a DBS candidate given the significant depression. 2.  Depression.  -She is following with  Dr. Dellia Cloud . She is not suicidal or homicidal. 3.I will try to get a copy of lab work from [facility] in [place].   4.  I will see her back in the next 6-8 weeks, sooner should new neurologic issues arise.

## 2013-06-22 NOTE — Patient Instructions (Addendum)
1.  Decrease requip to 1/2 tablet twice per day for a week, then 1/2 tablet once per day for a week and then stop the medication 2.  Start amantadine - 100 mg three times per day  Please make a follow up visit for three to four weeks

## 2013-06-26 ENCOUNTER — Ambulatory Visit: Payer: Medicare Other | Admitting: Psychology

## 2013-07-06 ENCOUNTER — Ambulatory Visit (INDEPENDENT_AMBULATORY_CARE_PROVIDER_SITE_OTHER): Payer: Medicare Other | Admitting: Psychology

## 2013-07-06 DIAGNOSIS — F331 Major depressive disorder, recurrent, moderate: Secondary | ICD-10-CM

## 2013-07-06 DIAGNOSIS — F411 Generalized anxiety disorder: Secondary | ICD-10-CM

## 2013-07-22 ENCOUNTER — Ambulatory Visit: Payer: Medicare Other | Admitting: Neurology

## 2013-07-27 ENCOUNTER — Ambulatory Visit: Payer: Medicare Other | Admitting: Psychology

## 2013-07-30 ENCOUNTER — Ambulatory Visit: Payer: Medicare Other | Admitting: Neurology

## 2013-08-02 ENCOUNTER — Other Ambulatory Visit: Payer: Self-pay | Admitting: Neurology

## 2013-08-03 ENCOUNTER — Ambulatory Visit: Payer: Medicare Other | Admitting: Psychology

## 2013-08-03 NOTE — Telephone Encounter (Signed)
Carbidopa Levodopa refill requested. Per last office note- patient to remain on medication. Refill approved and sent to patient's pharmacy.   

## 2013-08-06 ENCOUNTER — Ambulatory Visit: Payer: Medicare Other | Admitting: Neurology

## 2013-08-10 ENCOUNTER — Encounter: Payer: Self-pay | Admitting: Neurology

## 2013-08-10 ENCOUNTER — Ambulatory Visit (INDEPENDENT_AMBULATORY_CARE_PROVIDER_SITE_OTHER): Payer: Medicare Other | Admitting: Neurology

## 2013-08-10 ENCOUNTER — Telehealth: Payer: Self-pay | Admitting: Neurology

## 2013-08-10 VITALS — BP 116/86 | HR 76 | Resp 18

## 2013-08-10 DIAGNOSIS — F3289 Other specified depressive episodes: Secondary | ICD-10-CM

## 2013-08-10 DIAGNOSIS — G219 Secondary parkinsonism, unspecified: Secondary | ICD-10-CM

## 2013-08-10 DIAGNOSIS — R269 Unspecified abnormalities of gait and mobility: Secondary | ICD-10-CM

## 2013-08-10 DIAGNOSIS — F329 Major depressive disorder, single episode, unspecified: Secondary | ICD-10-CM

## 2013-08-10 NOTE — Telephone Encounter (Signed)
I talked to the pts PT, with the patients permission.  I wanted to see the pt see PM and R, consider SNF therapy but each time I suggest alternatives, pt brings up that she is emotionally attached to her PT.   I talked with Sabra (her PT) and she said that the pt is very attached to her but that she thinks that part of the problem is that the pt drinks EtOH a lot when she becomes depressed.  There also was a concern that her husband was drinking too much and that the home environment wasn't all that great.  Sabra was going to try and talk with pt about using the alternative therapies/resources that we had suggested.  I will address next visit with her the alcohol, especially given the fact that she is on coumadin and is already a fall risk.

## 2013-08-10 NOTE — Progress Notes (Signed)
Christy Pham was seen today in the movement disorders clinic for neurologic follow up regarding parkinsonism.     This patient is accompanied in the office by her spouse  Christy Stable(Bill) who supplements the history.    The last note that I have from [name] was May, 2013.  The patient was [name] to Dr. Anne HahnWillis.  She has had symptoms ever since 2001.  The patient reports first sx was tremor in the R hand that was intermittent.  She is R hand dominant.  She now rarely has tremor in the L hand and has had that for about 2 years.  She believes that her first drug was requip.  She cannot remember the year and is unsure if the requip was or is helpful.  Several years later, she started on levodopa.  It was beneficial but not dramatic.  She was changed to stalevo in May 2013.  She is unsure if the change was helpful.    Currently, the patient is on Stalevo 100 mg 3 times a day.  She is on Requip 3 mg 3 times a day.  She was previously on Artane.  11/05/12  Last visit, I decreased her Requip to 2 mg 3 times a day because of hallucinations and LE edema.  She has had less hallucinations but states that she "sort of misses the people."   She states that the "people in the backyard have left."  She still has some visual hallucinations, but most of that is seeing faces in trees.   She is on Stalevo 100 tid.  She was to change the timing of her stalevo last visit but she is still taking it at 9:30am, 4:30pm and 9:30 pm and she awakens at 6:30 am and goes to bed at 10pm. She is still doing home PT and is working on transferring from the hover round to the manual wheelchair.  She doesn't want to go to the neurorehab center because she is emotionally attached to her therapists.  Overall the pt and her husband agree that she is doing better  02/11/13 update:  Last visit, I decreased the patient's Requip because of hallucinations.  She no longer has "visitors" in the backyard and "I kind of miss  those." Her Stalevo was increased to 100 mg 4 times per day.  Her husband feels that her medication is "erratic" and sometimes he notices when it helps and other times he is not so sure. I referred her to [name] last visit, and she reports that she has been to see him several times.  She is not sure if this helped, but her husband states that the patient "likes talking to him."  There is still significant difficulty with transfers.  She had 1 fall similar to her previous one where she slipped down between a chair.  This is the only fall that she is, but she does not walk much.  She relies completely on her husband to help transfer.  This has become somewhat more difficult.  She did get a scooter and he modified that to make it more comfortable, but she still has difficulty getting on and off.  02/17/13 update:  Pt presents today for on/off testing.  She has been off all med (was told to d/c levodopa but remain on other meds) for over 24 hours.  She feels more stiff and shaky since being off of the medication.  No falls.  Does not wish to consider  going to more intensive rehabilitation therapy at least until January.  She has had significant difficulty transferring.  No hallucinations.  06/22/13 update:  Pt presents today for f/u, accompanied by her husband who supplements the hx.  The pt states that she has generally been better.  Her mind has been much more clear.  The patient still states that she misses the people she is to see in the hallucinations.  She still does have some hallucinations, but her husband states that there nothing like they used to be.  She still has the physical therapist coming on.  Her husband she is much easier transfer, but she still cannot walk.  She does state that the tremor can be very bothersome.  She wonders if going up on the levodopa can help.  She is currently on Stalevo at 7 AM/11 AM/3 PM/7 PM and carbidopa/levodopa 25/100 at 9 AM and 1 PM.  She remains on ropirinole 2  mg, half tablet 3 times per day.  08/10/13 update:  Pt presents today for f/u, accompanied by her husband who supplements the history.  She is currently on Stalevo at 7 AM/11 AM/3 PM/7 PM and carbidopa/levodopa 25/100 at 9 AM and 1 PM.  Requip has been discontinued since last visit.  Her cognition is improved.  Hallucinations are resolved. Last visit, amantadine 100 mg tid was added.  Tremor is overall better but she has good and bad days.  She has refused both aqua therapy and in patient rehab, which I think would benefit her greatly.  She has refused physiatry consultation.  She is still having depression and is working this out with Dr. Dellia Cloud.  He told her that she needs to find a hobby.  She has no SI/HI.     2004 MRI report: CLINICAL DATA: TREMORS ON RIGHT SIDE.  MRI OF THE BRAIN WITHOUT AND WITH CONTRAST  NO COMPARISON.  THERE IS MILD BRAIN ATROPHY. THERE IS A SMALL HYPERINTENSITY IN THE POSTERIOR LIMB OF THE INTERNAL  CAPSULE ON THE LEFT WHICH APPEARS TO BE AN OLD INFARCT. THE REMAINDER OF THE WHITE MATTER APPEARS  NORMAL. THE BRAINSTEM APPEARS NORMAL. THERE IS NO MASS OR ACUTE INFARCT. THERE IS NO PATHOLOGIC  ENHANCEMENT.  IMPRESSION  NO ACUTE ABNORMALITY.   PREVIOUS MEDICATIONS: Sinemet, Requip and artane  ALLERGIES:  No Known Allergies  CURRENT MEDICATIONS:  Current Outpatient Prescriptions on File Prior to Visit  Medication Sig Dispense Refill  . ALPRAZolam (XANAX) 0.25 MG tablet Take 0.25 mg by mouth 3 (three) times daily as needed. For anxiety      . amantadine (SYMMETREL) 100 MG capsule Take 1 capsule (100 mg total) by mouth 3 (three) times daily.  90 capsule  5  . carbidopa-levodopa (SINEMET IR) 25-100 MG per tablet TAKE 1 TABLET BY MOUTH 2 (TWO) TIMES DAILY.  60 tablet  5  . carbidopa-levodopa-entacapone (STALEVO) 25-100-200 MG per tablet Take 1 tablet by mouth 4 (four) times daily.  360 tablet  1  . digoxin (LANOXIN) 0.25 MG tablet Take 250 mcg by mouth daily.      Marland Kitchen  FLUoxetine (PROZAC) 40 MG capsule Take 40 mg by mouth daily.      . metoprolol tartrate (LOPRESSOR) 25 MG tablet Take 12.5 mg by mouth 2 (two) times daily.      Marland Kitchen rOPINIRole (REQUIP) 2 MG tablet tid  270 tablet  3  . simvastatin (ZOCOR) 40 MG tablet Take 40 mg by mouth every evening.      . warfarin (  COUMADIN) 5 MG tablet Take 1 tablet (5 mg total) by mouth daily. Take 1 (5 mg) tablet by mouth daily.       No current facility-administered medications on file prior to visit.    PAST MEDICAL HISTORY:   Past Medical History  Diagnosis Date  . Parkinson's disease     dx circa 1990 by Dr. Thad Ranger  . Hyperlipidemia     dx circa 2008  . Hypertension     dx circa 2008  . Osteoarthritis     L knee primarily  . Atrial fibrillation     PAST SURGICAL HISTORY:   Past Surgical History  Procedure Laterality Date  . Tubal ligation      circa 1983  . Tonsillectomy      circa 1950  . Colonoscopy      2006 by Jeani Hawking (clear per pt)    SOCIAL HISTORY:   History   Social History  . Marital Status: Married    Spouse Name: N/A    Number of Children: 1  . Years of Education: 16   Occupational History  . 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   Social History Main Topics  . Smoking status: Never Smoker   . Smokeless tobacco: Never Used  . Alcohol Use: Yes     Comment: 3-4 times per week (brandy, occ wine)  . Drug Use: No  . Sexual Activity: Not Currently   Other Topics Concern  . Not on file   Social History Narrative  . No narrative on file    FAMILY HISTORY:   Family Status  Relation Status Death Age  . Father Deceased 46    AAA  . Mother Deceased 48    "old age"  . Sister Alive 90    healthy  . Child Alive     1 son, healthy    ROS:  A complete 10 system review of systems was obtained and was unremarkable apart from what is mentioned above.  PHYSICAL EXAMINATION:    VITALS:   There were no vitals  filed for this visit.  GEN:  The patient appears stated age and is in NAD. HEENT:  Normocephalic, atraumatic.  The mucous membranes are moist. The superficial temporal arteries are without ropiness or tenderness. CV:  Irregular. Lungs:  CTAB Neck/HEME:  There are no carotid bruits bilaterally. Exts:  There is LE edema  Neurological examination:  Orientation: The patient is alert and oriented x3. Fund of knowledge is appropriate.  Recent and remote memory are intact.   Cranial nerves: There is good facial symmetry. Pupils are equal round and reactive to light bilaterally. Fundoscopic exam reveals clear margins bilaterally. Extraocular muscles are intact. The visual fields are full to confrontational testing. The speech is fluent and clear. Soft palate rises symmetrically and there is no tongue deviation. Hearing is intact to conversational tone. Sensation: Sensation is intact to light touch throughout Motor: Strength is 5/5 in the bilateral upper and lower extremities.   Shoulder shrug is equal and symmetric.  There is no pronator drift.   Movement examination: Tone: Normal tone in the bilateral upper extremities today.  Abnormal movements: There is a intermittent RUE tremor but it is much improved from previous.  There is some mild dyskinesia noted. Coordination:  There is moderate decremation with RAM's, seen bilaterally but much more pronounced in the left hemibody, Including alternating supination and pronation of the forearm, hand  opening and closing, finger taps, heel taps and toe taps. Gait and Station: The patient is unable to arise out of the chair without use of her hands.  Even with her hands, she has difficulty getting up.      ASSESSMENT/PLAN:  1.  Parkinsonism.  -It is certainly possible that this represents idiopathic Parkinson's disease, but I am also considering the possibility of MSA.    -she will continue her Stalevo which she is taking at 7 AM/11 AM/3 PM/7pm, and  carbidopa/levodopa 25/100 at 9 AM and 1 PM.    -requip has been d/c due to hallucinations and cognitive change.    -  I. think she would benefit greatly from inpatient rehabilitation.  She has been very resistant to any type of rehabilitation besides home health, because she has developed an emotional attachment to her therapist from home health company.  I asked her if I could refer her to physiatry, but she does not want to do that.  I am going to leave a message with her PT from [facility] and see if I can talk with her further and have her encourage other therapy forms as well.  -She will continue on amantadine 100 mg tid.  -Unfortunately, I do not think that she is a DBS candidate given the significant depression. 2.  Depression.  -She is following with  [name] . She is not suicidal or homicidal.  I agree with [name] that the pt needs a hobby and we talked about that in detail today. 3.  Forms for the [employer] were filled out.  I wrote a letter re: jury duty.   4.  Return in about 4 months (around 12/10/2013).

## 2013-08-11 ENCOUNTER — Telehealth: Payer: Self-pay | Admitting: Neurology

## 2013-08-11 NOTE — Telephone Encounter (Signed)
Received prior auth request for Stalevo. Filled out paperwork and approval received from [facility]. Faxed to [facility] [address]. Confirmation received.

## 2013-09-11 ENCOUNTER — Telehealth: Payer: Self-pay | Admitting: Neurology

## 2013-09-11 DIAGNOSIS — G20 Parkinson's disease: Secondary | ICD-10-CM

## 2013-09-11 NOTE — Telephone Encounter (Signed)
When our neurorehab center has done the evals in the past, they have sent us the eval and we signed them and just had to sign off that we saw the pt in the prior 30 days.  Can you call Vince at our neurorehab center and talk with him about the [facility] rules regarding this.  I do know that I can do the mobility eval here and it does need to be a separate eval but I was told that the neurorehab center could do them and as long as I had seen the pt in the preceding 30 days, it was fine???

## 2013-09-11 NOTE — Telephone Encounter (Signed)
Please call [facility] regarding order for Power Wheelchair [phone] [id number] / Oneita KrasSherri S.   Called and spoke with Herbert SetaHeather. She states the note from the last visit will not be enough to authorize a power wheelchair. She states patient needs a specific visit just to discuss the wheelchair. Please advise if this is something we would do here or if she needs to see her PCP.

## 2013-09-11 NOTE — Telephone Encounter (Signed)
The neurorehab center does power WC evals.  Please schedule that.

## 2013-09-11 NOTE — Telephone Encounter (Signed)
Spoke with Gloris ManchesterVince and he states this should be sufficient. Referral made to neuro rehab for wheel chair evaluation. They will contact the patient to schedule.

## 2013-09-11 NOTE — Telephone Encounter (Signed)
When I spoke with Christy Pham, I told her that OT usually does these evaluations but she states they need a doctor eval. Please advise.

## 2013-12-11 ENCOUNTER — Encounter: Payer: Self-pay | Admitting: Neurology

## 2013-12-11 ENCOUNTER — Ambulatory Visit (INDEPENDENT_AMBULATORY_CARE_PROVIDER_SITE_OTHER): Payer: Medicare Other | Admitting: Neurology

## 2013-12-11 VITALS — BP 128/82 | HR 67

## 2013-12-11 DIAGNOSIS — R259 Unspecified abnormal involuntary movements: Secondary | ICD-10-CM

## 2013-12-11 DIAGNOSIS — F3289 Other specified depressive episodes: Secondary | ICD-10-CM

## 2013-12-11 DIAGNOSIS — F329 Major depressive disorder, single episode, unspecified: Secondary | ICD-10-CM

## 2013-12-11 DIAGNOSIS — R251 Tremor, unspecified: Secondary | ICD-10-CM

## 2013-12-11 DIAGNOSIS — Z789 Other specified health status: Secondary | ICD-10-CM

## 2013-12-11 DIAGNOSIS — Z5181 Encounter for therapeutic drug level monitoring: Secondary | ICD-10-CM

## 2013-12-11 DIAGNOSIS — Z7289 Other problems related to lifestyle: Secondary | ICD-10-CM

## 2013-12-11 DIAGNOSIS — G219 Secondary parkinsonism, unspecified: Secondary | ICD-10-CM

## 2013-12-11 DIAGNOSIS — G20 Parkinson's disease: Secondary | ICD-10-CM

## 2013-12-11 DIAGNOSIS — F32A Depression, unspecified: Secondary | ICD-10-CM

## 2013-12-11 LAB — CBC WITH DIFFERENTIAL/PLATELET
BASOS PCT: 1 % (ref 0–1)
Basophils Absolute: 0.1 10*3/uL (ref 0.0–0.1)
EOS ABS: 0.1 10*3/uL (ref 0.0–0.7)
EOS PCT: 1 % (ref 0–5)
HEMATOCRIT: 39.3 % (ref 36.0–46.0)
HEMOGLOBIN: 13.2 g/dL (ref 12.0–15.0)
LYMPHS ABS: 1.9 10*3/uL (ref 0.7–4.0)
Lymphocytes Relative: 22 % (ref 12–46)
MCH: 31.3 pg (ref 26.0–34.0)
MCHC: 33.6 g/dL (ref 30.0–36.0)
MCV: 93.1 fL (ref 78.0–100.0)
MONO ABS: 0.5 10*3/uL (ref 0.1–1.0)
MONOS PCT: 6 % (ref 3–12)
NEUTROS PCT: 70 % (ref 43–77)
Neutro Abs: 6 10*3/uL (ref 1.7–7.7)
Platelets: 290 10*3/uL (ref 150–400)
RBC: 4.22 MIL/uL (ref 3.87–5.11)
RDW: 14.2 % (ref 11.5–15.5)
WBC: 8.5 10*3/uL (ref 4.0–10.5)

## 2013-12-11 LAB — COMPREHENSIVE METABOLIC PANEL
ALT: 10 U/L (ref 0–35)
AST: 19 U/L (ref 0–37)
Albumin: 3.8 g/dL (ref 3.5–5.2)
Alkaline Phosphatase: 72 U/L (ref 39–117)
BILIRUBIN TOTAL: 0.7 mg/dL (ref 0.2–1.2)
BUN: 9 mg/dL (ref 6–23)
CO2: 26 meq/L (ref 19–32)
Calcium: 8.7 mg/dL (ref 8.4–10.5)
Chloride: 103 mEq/L (ref 96–112)
Creat: 0.65 mg/dL (ref 0.50–1.10)
GLUCOSE: 89 mg/dL (ref 70–99)
Potassium: 4 mEq/L (ref 3.5–5.3)
SODIUM: 139 meq/L (ref 135–145)
TOTAL PROTEIN: 6.5 g/dL (ref 6.0–8.3)

## 2013-12-11 LAB — TSH: TSH: 1.846 u[IU]/mL (ref 0.350–4.500)

## 2013-12-11 NOTE — Progress Notes (Signed)
Christy Pham was seen today in the movement disorders clinic for neurologic follow up regarding parkinsonism.     This patient is accompanied in the office by her spouse  Christy Stable(Bill) who supplements the history.    The last note that I have from [name] was May, 2013.  The patient [name] to Dr. Anne HahnWillis.  She has had symptoms ever since 2001.  The patient reports first sx was tremor in the R hand that was intermittent.  She is R hand dominant.  She now rarely has tremor in the L hand and has had that for about 2 years.  She believes that her first drug was requip.  She cannot remember the year and is unsure if the requip was or is helpful.  Several years later, she started on levodopa.  It was beneficial but not dramatic.  She was changed to stalevo in May 2013.  She is unsure if the change was helpful.    Currently, the patient is on Stalevo 100 mg 3 times a day.  She is on Requip 3 mg 3 times a day.  She was previously on Artane.  11/05/12  Last visit, I decreased her Requip to 2 mg 3 times a day because of hallucinations and LE edema.  She has had less hallucinations but states that she "sort of misses the people."   She states that the "people in the backyard have left."  She still has some visual hallucinations, but most of that is seeing faces in trees.   She is on Stalevo 100 tid.  She was to change the timing of her stalevo last visit but she is still taking it at 9:30am, 4:30pm and 9:30 pm and she awakens at 6:30 am and goes to bed at 10pm. She is still doing home PT and is working on transferring from the hover round to the manual wheelchair.  She doesn't want to go to the neurorehab center because she is emotionally attached to her therapists.  Overall the pt and her husband agree that she is doing better  02/11/13 update:  Last visit, I decreased the patient's Requip because of hallucinations.  She no longer has "visitors" in the backyard and "I kind of miss  those." Her Stalevo was increased to 100 mg 4 times per day.  Her husband feels that her medication is "erratic" and sometimes he notices when it helps and other times he is not so sure. I referred her to [name] last visit, and she reports that she has been to see him several times.  She is not sure if this helped, but her husband states that the patient "likes talking to him."  There is still significant difficulty with transfers.  She had 1 fall similar to her previous one where she slipped down between a chair.  This is the only fall that she is, but she does not walk much.  She relies completely on her husband to help transfer.  This has become somewhat more difficult.  She did get a scooter and he modified that to make it more comfortable, but she still has difficulty getting on and off.  02/17/13 update:  Pt presents today for on/off testing.  She has been off all med (was told to d/c levodopa but remain on other meds) for over 24 hours.  She feels more stiff and shaky since being off of the medication.  No falls.  Does not wish to consider  going to more intensive rehabilitation therapy at least until January.  She has had significant difficulty transferring.  No hallucinations.  06/22/13 update:  Pt presents today for f/u, accompanied by her husband who supplements the hx.  The pt states that she has generally been better.  Her mind has been much more clear.  The patient still states that she misses the people she is to see in the hallucinations.  She still does have some hallucinations, but her husband states that there nothing like they used to be.  She still has the physical therapist coming on.  Her husband she is much easier transfer, but she still cannot walk.  She does state that the tremor can be very bothersome.  She wonders if going up on the levodopa can help.  She is currently on Stalevo at 7 AM/11 AM/3 PM/7 PM and carbidopa/levodopa 25/100 at 9 AM and 1 PM.  She remains on ropirinole 2  mg, half tablet 3 times per day.  08/10/13 update:  Pt presents today for f/u, accompanied by her husband who supplements the history.  She is currently on Stalevo at 7 AM/11 AM/3 PM/7 PM and carbidopa/levodopa 25/100 at 9 AM and 1 PM.  Requip has been discontinued since last visit.  Her cognition is improved.  Hallucinations are resolved. Last visit, amantadine 100 mg tid was added.  Tremor is overall better but she has good and bad days.  She has refused both aqua therapy and in patient rehab, which I think would benefit her greatly.  She has refused physiatry consultation.  She is still having depression and is working this out with Dr. Dellia Cloud.  He told her that she needs to find a hobby.  She has no SI/HI.    12/11/13 update:  Pt presents today for f/u, accompanied by her husband who supplements the history.  She is currently on Stalevo at 7 AM/11 AM/3 PM/7 PM and carbidopa/levodopa 25/100 at 9 AM and 1 PM. She is also on amantadine 100 tid.  Since our last visit, I did have the opportunity to talk to one of her physical therapist that works with her at home.  Her physical therapist provided the history that there are times that she arrived at the home and the patient is drunk or has drank too much alcohol.  Feels that she does that to control depression.  Pts husband states that she had some cognitive issues last week but pt is insistent that she must have been falling asleep and was dreaming when he was talking to her.  He doesn't say anything further when she begins to disagree about cognitive changes.  Was to get a WC mobility eval at the neurorehab center; they state that the rehab center didn't call them but when the rehab center was contacted while they were in the office, the rehab center stated that they did contact the patient but they couldn't be reached.  Pt hasn't f/u with PCP since Jan   2004 MRI report: CLINICAL DATA: TREMORS ON RIGHT SIDE.  MRI OF THE BRAIN WITHOUT AND WITH CONTRAST   NO COMPARISON.  THERE IS MILD BRAIN ATROPHY. THERE IS A SMALL HYPERINTENSITY IN THE POSTERIOR LIMB OF THE INTERNAL  CAPSULE ON THE LEFT WHICH APPEARS TO BE AN OLD INFARCT. THE REMAINDER OF THE WHITE MATTER APPEARS  NORMAL. THE BRAINSTEM APPEARS NORMAL. THERE IS NO MASS OR ACUTE INFARCT. THERE IS NO PATHOLOGIC  ENHANCEMENT.  IMPRESSION  NO ACUTE ABNORMALITY.   PREVIOUS MEDICATIONS:  Sinemet, Requip and artane  ALLERGIES:  No Known Allergies  CURRENT MEDICATIONS:  Current Outpatient Prescriptions on File Prior to Visit  Medication Sig Dispense Refill  . amantadine (SYMMETREL) 100 MG capsule Take 1 capsule (100 mg total) by mouth 3 (three) times daily.  90 capsule  5  . carbidopa-levodopa-entacapone (STALEVO) 25-100-200 MG per tablet Take 1 tablet by mouth 4 (four) times daily.  360 tablet  1  . digoxin (LANOXIN) 0.25 MG tablet Take 250 mcg by mouth daily.      Marland Kitchen. FLUoxetine (PROZAC) 40 MG capsule Take 40 mg by mouth daily.      . metoprolol tartrate (LOPRESSOR) 25 MG tablet Take 12.5 mg by mouth 2 (two) times daily.      . simvastatin (ZOCOR) 40 MG tablet Take 40 mg by mouth every evening.      . warfarin (COUMADIN) 5 MG tablet Take 1 tablet (5 mg total) by mouth daily. Take 1 (5 mg) tablet by mouth daily.       No current facility-administered medications on file prior to visit.    PAST MEDICAL HISTORY:   Past Medical History  Diagnosis Date  . Parkinson's disease     dx circa 1990 by [name]  . Hyperlipidemia     dx circa 2008  . Hypertension     dx circa 2008  . Osteoarthritis     L knee primarily  . Atrial fibrillation     PAST SURGICAL HISTORY:   Past Surgical History  Procedure Laterality Date  . Tubal ligation      circa 1983  . Tonsillectomy      circa 1950  . Colonoscopy      2006 by Jeani HawkingPatrick Hung (clear per pt)    SOCIAL HISTORY:   History   Social History  . Marital Status: Married    Spouse Name: N/A    Number of Children: 1  . Years of  Education: 16   Occupational History  . 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      clerical at [employer]   Social History Main Topics  . Smoking status: Never Smoker   . Smokeless tobacco: Never Used     Comment: reviewed 12/11/13  . Alcohol Use: Yes     Comment: 3-4 times per week (brandy, occ wine)  . Drug Use: No  . Sexual Activity: Not Currently   Other Topics Concern  . Not on file   Social History Narrative  . No narrative on file    FAMILY HISTORY:   Family Status  Relation Status Death Age  . Father Deceased 7484    AAA  . Mother Deceased 5090    "old age"  . Sister Alive 6162    healthy  . Child Alive     1 son, healthy    ROS:  A complete 10 system review of systems was obtained and was unremarkable apart from what is mentioned above.  PHYSICAL EXAMINATION:    VITALS:   Filed Vitals:   12/11/13 1044  BP: 128/82  Pulse: 67    GEN:  The patient appears stated age and is in NAD. HEENT:  Normocephalic, atraumatic.  The mucous membranes are moist. The superficial temporal arteries are without ropiness or tenderness. CV:  Irregular. Lungs:  CTAB Neck/HEME:  There are no carotid bruits bilaterally. Exts:  There is LE edema  Neurological examination:  Orientation: A MoCA was performed on 12/11/13 and the patient scored 26/30.  The patient is alert and oriented x3.  Cranial nerves: There is good facial symmetry. Pupils are equal round and reactive to light bilaterally. Fundoscopic exam reveals clear margins bilaterally. Extraocular muscles are intact. The visual fields are full to confrontational testing. The speech is fluent and clear. Soft palate rises symmetrically and there is no tongue deviation. Hearing is intact to conversational tone. Sensation: Sensation is intact to light touch throughout Motor: Strength is 5/5 in the bilateral upper and lower extremities.   Shoulder shrug is equal and symmetric.  There is no pronator  drift.   Movement examination: Tone: Normal tone in the bilateral upper extremities today.  Abnormal movements: There is very mild UE tremor today (marked improvement).  There is some mild dyskinesia noted. Coordination:  There is mild to mod decremation with RAM's, seen bilaterally but much more pronounced in the left hemibody, Including alternating supination and pronation of the forearm, hand opening and closing, finger taps, heel taps and toe taps. Gait and Station: The patient is unable to arise out of the chair without use of her hands.  Even with her hands, she has difficulty getting up.      ASSESSMENT/PLAN:  1.  Parkinsonism.  -It is certainly possible that this represents idiopathic Parkinson's disease, but I am also considering the possibility of MSA.    -she will continue her Stalevo which she is taking at 7 AM/11 AM/3 PM/7pm, and carbidopa/levodopa 25/100 at 9 AM and 1 PM.    -requip has been d/c due to hallucinations and cognitive change.    -  I. think she would benefit greatly from inpatient rehabilitation but has refused  -She will continue on amantadine 100 mg tid.  -I set up the WC eval at neurorehab center 2.  Excess EtOH use  -Talked to her about her fall risk given PD and how detrimental this would be given fact she is on coumadin and already cannot ambulate on her own.  Would like to see her d/c EtOH all together.  Talked to her about this today.  Called Dr. Andrey Campanile and discussed this with him as well.   -CBC, CMP, TSH drawn today 3.  Depression.  -She unfortunately has stopped seeing Dr. Dellia Cloud.  Discussed with her today that I don't think that this is a good idea.  Discussed that physical and mental health are strongly intertwined.  Greater than 50% of 30 min visit in counseling. 4.  Return in about 4 months (around 04/13/2014).

## 2013-12-11 NOTE — Patient Instructions (Addendum)
1. Your wheel chair evaluation has been scheduled at the [facility] located at [address], [facility] [place] [id number] on January 14, 2014 at 10:15 am to arrive at 9:45 am. Please contact [facility] to have a representative present at the time of visit. If this time and date does not work for you please contact them at [phone] to reschedule.  2. Your provider has requested that you have labwork completed today. [facility] on the first floor of this building before leaving the office today.

## 2013-12-14 NOTE — Progress Notes (Signed)
Lab results faxed to Dr Benedetto GoadFred Wilson at [phone] with confirmation received.

## 2014-01-04 ENCOUNTER — Encounter: Payer: Self-pay | Admitting: Neurology

## 2014-01-04 ENCOUNTER — Ambulatory Visit (INDEPENDENT_AMBULATORY_CARE_PROVIDER_SITE_OTHER): Payer: Medicare Other | Admitting: Neurology

## 2014-01-04 VITALS — BP 118/68 | HR 76 | Resp 20

## 2014-01-04 DIAGNOSIS — G20 Parkinson's disease: Secondary | ICD-10-CM

## 2014-01-04 NOTE — Progress Notes (Signed)
Christy Pham was seen today in the movement disorders clinic for neurologic follow up regarding parkinsonism.     This patient is accompanied in the office by her spouse  [name] who supplements the history.    The last note that I have from [name] was May, 2013.  The patient was seen by [name] prior to Dr. Anne HahnWillis.  She has had symptoms ever since 2001.  The patient reports first sx was tremor in the R hand that was intermittent.  She is R hand dominant.  She now rarely has tremor in the L hand and has had that for about 2 years.  She believes that her first drug was requip.  She cannot remember the year and is unsure if the requip was or is helpful.  Several years later, she started on levodopa.  It was beneficial but not dramatic.  She was changed to stalevo in May 2013.  She is unsure if the change was helpful.    Currently, the patient is on Stalevo 100 mg 3 times a day.  She is on Requip 3 mg 3 times a day.  She was previously on Artane.  11/05/12  Last visit, I decreased her Requip to 2 mg 3 times a day because of hallucinations and LE edema.  She has had less hallucinations but states that she "sort of misses the people."   She states that the "people in the backyard have left."  She still has some visual hallucinations, but most of that is seeing faces in trees.   She is on Stalevo 100 tid.  She was to change the timing of her stalevo last visit but she is still taking it at 9:30am, 4:30pm and 9:30 pm and she awakens at 6:30 am and goes to bed at 10pm. She is still doing home PT and is working on transferring from the hover round to the manual wheelchair.  She doesn't want to go to the neurorehab center because she is emotionally attached to her therapists.  Overall the pt and her husband agree that she is doing better  02/11/13 update:  Last visit, I decreased the patient's Requip because of hallucinations.  She no longer has "visitors" in the backyard and "I kind of miss  those." Her Stalevo was increased to 100 mg 4 times per day.  Her husband feels that her medication is "erratic" and sometimes he notices when it helps and other times he is not so sure. I referred her to [name] last visit, and she reports that she has been to see him several times.  She is not sure if this helped, but her husband states that the patient "likes talking to him."  There is still significant difficulty with transfers.  She had 1 fall similar to her previous one where she slipped down between a chair.  This is the only fall that she is, but she does not walk much.  She relies completely on her husband to help transfer.  This has become somewhat more difficult.  She did get a scooter and he modified that to make it more comfortable, but she still has difficulty getting on and off.  02/17/13 update:  Pt presents today for on/off testing.  She has been off all med (was told to d/c levodopa but remain on other meds) for over 24 hours.  She feels more stiff and shaky since being off of the medication.  No falls.  Does not wish to consider  going to more intensive rehabilitation therapy at least until January.  She has had significant difficulty transferring.  No hallucinations.  06/22/13 update:  Pt presents today for f/u, accompanied by her husband who supplements the hx.  The pt states that she has generally been better.  Her mind has been much more clear.  The patient still states that she misses the people she is to see in the hallucinations.  She still does have some hallucinations, but her husband states that there nothing like they used to be.  She still has the physical therapist coming on.  Her husband she is much easier transfer, but she still cannot walk.  She does state that the tremor can be very bothersome.  She wonders if going up on the levodopa can help.  She is currently on Stalevo at 7 AM/11 AM/3 PM/7 PM and carbidopa/levodopa 25/100 at 9 AM and 1 PM.  She remains on ropirinole 2  mg, half tablet 3 times per day.  08/10/13 update:  Pt presents today for f/u, accompanied by her husband who supplements the history.  She is currently on Stalevo at 7 AM/11 AM/3 PM/7 PM and carbidopa/levodopa 25/100 at 9 AM and 1 PM.  Requip has been discontinued since last visit.  Her cognition is improved.  Hallucinations are resolved. Last visit, amantadine 100 mg tid was added.  Tremor is overall better but she has good and bad days.  She has refused both aqua therapy and in patient rehab, which I think would benefit her greatly.  She has refused physiatry consultation.  She is still having depression and is working this out with [name].  He told her that she needs to find a hobby.  She has no SI/HI.    12/11/13 update:  Pt presents today for f/u, accompanied by her husband who supplements the history.  She is currently on Stalevo at 7 AM/11 AM/3 PM/7 PM and carbidopa/levodopa 25/100 at 9 AM and 1 PM. She is also on amantadine 100 tid.  Since our last visit, I did have the opportunity to talk to one of her physical therapist that works with her at home.  Her physical therapist provided the history that there are times that she arrived at the home and the patient is drunk or has drank too much alcohol.  Feels that she does that to control depression.  Pts husband states that she had some cognitive issues last week but pt is insistent that she must have been falling asleep and was dreaming when he was talking to her.  He doesn't say anything further when she begins to disagree about cognitive changes.  Was to get a WC mobility eval at the neurorehab center; they state that the rehab center didn't call them but when the rehab center was contacted while they were in the office, the rehab center stated that they did contact the patient but they couldn't be reached.  Pt hasn't f/u with PCP since Jan  01/04/14 update:  Pt seen today, much sooner than anticipated.  Accompanied by husband who supplements the  history.   She is currently on Stalevo at 7 AM/11 AM/3 PM/7 PM and carbidopa/levodopa 25/100 at 9 AM and 1 PM. She is also on amantadine 100 tid.  She presents today for a mobility evaluation.  Currently, the patient uses a scooter, but the central drive shaft is in the way, and she cannot get her feet around it.  Her husband has to literally push her foot  around it, but the rigidity is a significant that he feels that he is going to cause damage to her foot when he does it.  He has had to modify her scooter because her feet do not otherwise lay on a normal scooter properly.  The patient cannot ambulate at all on her own with any type of ambulatory assistive device.  She cannot operate a manual wheelchair, as her arms are too weak and too tremulous.  She has a history of atrial fibrillation as well, which limits her ability from heart standpoint to maneuver around.  She does have the physical and mental ability to operate a power wheelchair safely.  Her home has been measured, and all doorways are able to accommodate a power wheelchair, with the exception of one bathroom, but she has other bathrooms in the home to accommodate it just fine.   2004 MRI report: CLINICAL DATA: TREMORS ON RIGHT SIDE.  MRI OF THE BRAIN WITHOUT AND WITH CONTRAST  NO COMPARISON.  THERE IS MILD BRAIN ATROPHY. THERE IS A SMALL HYPERINTENSITY IN THE POSTERIOR LIMB OF THE INTERNAL  CAPSULE ON THE LEFT WHICH APPEARS TO BE AN OLD INFARCT. THE REMAINDER OF THE WHITE MATTER APPEARS  NORMAL. THE BRAINSTEM APPEARS NORMAL. THERE IS NO MASS OR ACUTE INFARCT. THERE IS NO PATHOLOGIC  ENHANCEMENT.  IMPRESSION  NO ACUTE ABNORMALITY.   PREVIOUS MEDICATIONS: Sinemet, Requip and artane  ALLERGIES:  No Known Allergies  CURRENT MEDICATIONS:  Current Outpatient Prescriptions on File Prior to Visit  Medication Sig Dispense Refill  . amantadine (SYMMETREL) 100 MG capsule Take 1 capsule (100 mg total) by mouth 3 (three) times daily.  90  capsule  5  . carbidopa-levodopa-entacapone (STALEVO) 25-100-200 MG per tablet Take 1 tablet by mouth 4 (four) times daily.  360 tablet  1  . digoxin (LANOXIN) 0.25 MG tablet Take 250 mcg by mouth daily.      Marland Kitchen FLUoxetine (PROZAC) 40 MG capsule Take 40 mg by mouth daily.      . metoprolol tartrate (LOPRESSOR) 25 MG tablet Take 12.5 mg by mouth 2 (two) times daily.      . simvastatin (ZOCOR) 40 MG tablet Take 40 mg by mouth every evening.      . warfarin (COUMADIN) 5 MG tablet Take 1 tablet (5 mg total) by mouth daily. Take 1 (5 mg) tablet by mouth daily.       No current facility-administered medications on file prior to visit.    PAST MEDICAL HISTORY:   Past Medical History  Diagnosis Date  . Parkinson's disease     dx circa 1990 by Dr. Thad Ranger  . Hyperlipidemia     dx circa 2008  . Hypertension     dx circa 2008  . Osteoarthritis     L knee primarily  . Atrial fibrillation     PAST SURGICAL HISTORY:   Past Surgical History  Procedure Laterality Date  . Tubal ligation      circa 1983  . Tonsillectomy      circa 1950  . Colonoscopy      2006 by Jeani Hawking (clear per pt)    SOCIAL HISTORY:   History   Social History  . Marital Status: Married    Spouse Name: N/A    Number of Children: 1  . Years of Education: 16   Occupational History  . 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   Social History  Main Topics  . Smoking status: Never Smoker   . Smokeless tobacco: Never Used     Comment: reviewed 12/11/13  . Alcohol Use: Yes     Comment: 3-4 times per week (brandy, occ wine)  . Drug Use: No  . Sexual Activity: Not Currently   Other Topics Concern  . Not on file   Social History Narrative  . No narrative on file    FAMILY HISTORY:   Family Status  Relation Status Death Age  . Father Deceased 4984    AAA  . Mother Deceased [id number]    "old age"  . Sister Alive 4062    healthy  . Child Alive     1 son,  healthy    ROS:  A complete 10 system review of systems was obtained and was unremarkable apart from what is mentioned above.  PHYSICAL EXAMINATION:    VITALS:   Filed Vitals:   01/04/14 1315  BP: 118/68  Pulse: 76  Resp: 20    GEN:  The patient appears stated age and is in NAD. HEENT:  Normocephalic, atraumatic.  The mucous membranes are moist. The superficial temporal arteries are without ropiness or tenderness. CV:  Irregular. Lungs:  CTAB Neck/HEME:  There are no carotid bruits bilaterally. Exts:  There is LE edema  Neurological examination:  Orientation: A MoCA was performed on 12/11/13 and the patient scored 26/30.  The patient is alert and oriented x3 today.  Cranial nerves: There is good facial symmetry. Pupils are equal round and reactive to light bilaterally. Fundoscopic exam reveals clear margins bilaterally. Extraocular muscles are intact. The visual fields are full to confrontational testing. The speech is fluent and clear. Soft palate rises symmetrically and there is no tongue deviation. Hearing is intact to conversational tone. Sensation: Sensation is intact to light touch throughout Motor: Strength is 5/5 in the bilateral upper and lower extremities.   Shoulder shrug is equal and symmetric.  There is no pronator drift.   Movement examination: Tone: Normal tone in the bilateral upper extremities today.  Abnormal movements: There is  Definite tremor in the right upper extremity. Coordination:  There is mild to mod decremation with RAM's, seen bilaterally but much more pronounced in the left hemibody, Including alternating supination and pronation of the forearm, hand opening and closing, finger taps, heel taps and toe taps. Gait and Station: The patient is unable to arise out of the chair without use of her hands.  Even with her hands, she has difficulty getting up.      ASSESSMENT/PLAN:  1.  Parkinsonism.  -Need for PWC assessed and patient certainly needs this.   See above documentation.  Has no ability to stand, walk unassisted.  Unable to get feet around central drive shaft on scooter.  Cannot maneuver own manual WC.  Needs PWC.  -It is certainly possible that this represents idiopathic Parkinson's disease, but I am also considering the possibility of MSA.    -she will continue her Stalevo which she is taking at 7 AM/11 AM/3 PM/7pm, and carbidopa/levodopa 25/100 at 9 AM and 1 PM.    -requip has been d/c due to hallucinations and cognitive change.    -  I. think she would benefit greatly from inpatient rehabilitation but has refused  -She will continue on amantadine 100 mg tid. 2.  Excess EtOH use  -  Would like to see her d/c EtOH all together.  -CBC, CMP, TSH drawn today 3.  Depression.  -She unfortunately  has stopped seeing Dr. Dellia Cloud.  Discussed with her today that I don't think that this is a good idea.  Discussed that physical and mental health are strongly intertwined.  Greater than 50% of 30 min visit in counseling. 4.  F/u at previously scheduled visit.

## 2014-01-05 ENCOUNTER — Telehealth: Payer: Self-pay | Admitting: Neurology

## 2014-01-05 NOTE — Telephone Encounter (Signed)
Dr Don Perkingat's last office note faxed to [employer] at [phone] with confirmation received.

## 2014-01-13 ENCOUNTER — Other Ambulatory Visit: Payer: Self-pay | Admitting: Neurology

## 2014-01-13 NOTE — Telephone Encounter (Signed)
Amantadine refill requested. Per last office note- patient to remain on medication. Refill approved and sent to patient's pharmacy.   

## 2014-01-14 ENCOUNTER — Ambulatory Visit: Payer: Medicare Other | Admitting: Physical Therapy

## 2014-01-28 ENCOUNTER — Ambulatory Visit: Payer: Medicare Other | Admitting: Physical Therapy

## 2014-02-02 ENCOUNTER — Telehealth: Payer: Self-pay | Admitting: Neurology

## 2014-02-02 MED ORDER — AMANTADINE HCL 100 MG PO CAPS: 100.0000 mg | ORAL_CAPSULE | Freq: Three times a day (TID) | ORAL | Status: DC

## 2014-02-02 NOTE — Telephone Encounter (Signed)
Amantadine refill requested. Per last office note- patient to remain on medication. Refill approved and sent to patient's pharmacy.   

## 2014-04-16 ENCOUNTER — Ambulatory Visit (INDEPENDENT_AMBULATORY_CARE_PROVIDER_SITE_OTHER): Payer: Medicare Other | Admitting: Neurology

## 2014-04-16 ENCOUNTER — Encounter: Payer: Self-pay | Admitting: Neurology

## 2014-04-16 VITALS — BP 112/68 | HR 56

## 2014-04-16 DIAGNOSIS — R251 Tremor, unspecified: Secondary | ICD-10-CM

## 2014-04-16 DIAGNOSIS — F1099 Alcohol use, unspecified with unspecified alcohol-induced disorder: Secondary | ICD-10-CM

## 2014-04-16 DIAGNOSIS — Z7289 Other problems related to lifestyle: Secondary | ICD-10-CM

## 2014-04-16 DIAGNOSIS — F329 Major depressive disorder, single episode, unspecified: Secondary | ICD-10-CM

## 2014-04-16 DIAGNOSIS — F32A Depression, unspecified: Secondary | ICD-10-CM

## 2014-04-16 DIAGNOSIS — Z789 Other specified health status: Secondary | ICD-10-CM

## 2014-04-16 DIAGNOSIS — G20 Parkinson's disease: Secondary | ICD-10-CM

## 2014-04-16 NOTE — Progress Notes (Signed)
Christy Pham was seen today in the movement disorders clinic for neurologic follow up regarding parkinsonism.     This patient is accompanied in the office by her spouse  [name] who supplements the history.    The last note that I have from [name] was May, 2013.  The patient was [name] to Dr. Anne HahnWillis.  She has had symptoms ever since 2001.  The patient reports first sx was tremor in the R hand that was intermittent.  She is R hand dominant.  She now rarely has tremor in the L hand and has had that for about 2 years.  She believes that her first drug was requip.  She cannot remember the year and is unsure if the requip was or is helpful.  Several years later, she started on levodopa.  It was beneficial but not dramatic.  She was changed to stalevo in May 2013.  She is unsure if the change was helpful.    Currently, the patient is on Stalevo 100 mg 3 times a day.  She is on Requip 3 mg 3 times a day.  She was previously on Artane.  11/05/12  Last visit, I decreased her Requip to 2 mg 3 times a day because of hallucinations and LE edema.  She has had less hallucinations but states that she "sort of misses the people."   She states that the "people in the backyard have left."  She still has some visual hallucinations, but most of that is seeing faces in trees.   She is on Stalevo 100 tid.  She was to change the timing of her stalevo last visit but she is still taking it at 9:30am, 4:30pm and 9:30 pm and she awakens at 6:30 am and goes to bed at 10pm. She is still doing home PT and is working on transferring from the hover round to the manual wheelchair.  She doesn't want to go to the neurorehab center because she is emotionally attached to her therapists.  Overall the pt and her husband agree that she is doing better  02/11/13 update:  Last visit, I decreased the patient's Requip because of hallucinations.  She no longer has "visitors" in the backyard and "I kind of miss  those." Her Stalevo was increased to 100 mg 4 times per day.  Her husband feels that her medication is "erratic" and sometimes he notices when it helps and other times he is not so sure. I referred her to [name] last visit, and she reports that she has been to see him several times.  She is not sure if this helped, but her husband states that the patient "likes talking to him."  There is still significant difficulty with transfers.  She had 1 fall similar to her previous one where she slipped down between a chair.  This is the only fall that she is, but she does not walk much.  She relies completely on her husband to help transfer.  This has become somewhat more difficult.  She did get a scooter and he modified that to make it more comfortable, but she still has difficulty getting on and off.  02/17/13 update:  Pt presents today for on/off testing.  She has been off all med (was told to d/c levodopa but remain on other meds) for over 24 hours.  She feels more stiff and shaky since being off of the medication.  No falls.  Does not wish to consider  going to more intensive rehabilitation therapy at least until January.  She has had significant difficulty transferring.  No hallucinations.  06/22/13 update:  Pt presents today for f/u, accompanied by her husband who supplements the hx.  The pt states that she has generally been better.  Her mind has been much more clear.  The patient still states that she misses the people she is to see in the hallucinations.  She still does have some hallucinations, but her husband states that there nothing like they used to be.  She still has the physical therapist coming on.  Her husband she is much easier transfer, but she still cannot walk.  She does state that the tremor can be very bothersome.  She wonders if going up on the levodopa can help.  She is currently on Stalevo at 7 AM/11 AM/3 PM/7 PM and carbidopa/levodopa 25/100 at 9 AM and 1 PM.  She remains on ropirinole 2  mg, half tablet 3 times per day.  08/10/13 update:  Pt presents today for f/u, accompanied by her husband who supplements the history.  She is currently on Stalevo at 7 AM/11 AM/3 PM/7 PM and carbidopa/levodopa 25/100 at 9 AM and 1 PM.  Requip has been discontinued since last visit.  Her cognition is improved.  Hallucinations are resolved. Last visit, amantadine 100 mg tid was added.  Tremor is overall better but she has good and bad days.  She has refused both aqua therapy and in patient rehab, which I think would benefit her greatly.  She has refused physiatry consultation.  She is still having depression and is working this out with [name].  He told her that she needs to find a hobby.  She has no SI/HI.    12/11/13 update:  Pt presents today for f/u, accompanied by her husband who supplements the history.  She is currently on Stalevo at 7 AM/11 AM/3 PM/7 PM and carbidopa/levodopa 25/100 at 9 AM and 1 PM. She is also on amantadine 100 tid.  Since our last visit, I did have the opportunity to talk to one of her physical therapist that works with her at home.  Her physical therapist provided the history that there are times that she arrived at the home and the patient is drunk or has drank too much alcohol.  Feels that she does that to control depression.  Pts husband states that she had some cognitive issues last week but pt is insistent that she must have been falling asleep and was dreaming when he was talking to her.  He doesn't say anything further when she begins to disagree about cognitive changes.  Was to get a WC mobility eval at the neurorehab center; they state that the rehab center didn't call them but when the rehab center was contacted while they were in the office, the rehab center stated that they did contact the patient but they couldn't be reached.  Pt hasn't f/u with PCP since Jan  01/04/14 update:  Pt seen today, much sooner than anticipated.  Accompanied by husband who supplements the  history.   She is currently on Stalevo at 7 AM/11 AM/3 PM/7 PM and carbidopa/levodopa 25/100 at 9 AM and 1 PM. She is also on amantadine 100 tid.  She presents today for a mobility evaluation.  Currently, the patient uses a scooter, but the central drive shaft is in the way, and she cannot get her feet around it.  Her husband has to literally push her foot  around it, but the rigidity is a significant that he feels that he is going to cause damage to her foot when he does it.  He has had to modify her scooter because her feet do not otherwise lay on a normal scooter properly.  The patient cannot ambulate at all on her own with any type of ambulatory assistive device.  She cannot operate a manual wheelchair, as her arms are too weak and too tremulous.  She has a history of atrial fibrillation as well, which limits her ability from heart standpoint to maneuver around.  She does have the physical and mental ability to operate a power wheelchair safely.  Her home has been measured, and all doorways are able to accommodate a power wheelchair, with the exception of one bathroom, but she has other bathrooms in the home to accommodate it just fine.  04/16/14 update:  Patient is following up today, accompanied by her husband and a new caregiver who supplements the history.  Patient remains on Stalevo 100 at 7 AM/11 AM/3 PM/7 PM and an additional carbidopa/levodopa 25/100 at 9 AM and 1 PM.  She is also on amantadine 100 mg 3 times per day.  Despite the fact that we have been working for almost a year on trying to get her a power wheelchair, she still does not have it.  She is frustrated by this.  She does have a new caregiver and is doing more with her but cannot get out of the home much.  Awaiting on both the power WC and a new van.  No falls.  No hallucinations.  Pt decreased alcohol to 3 glasses wine per week.   2004 MRI report: CLINICAL DATA: TREMORS ON RIGHT SIDE.  MRI OF THE BRAIN WITHOUT AND WITH CONTRAST  NO  COMPARISON.  THERE IS MILD BRAIN ATROPHY. THERE IS A SMALL HYPERINTENSITY IN THE POSTERIOR LIMB OF THE INTERNAL  CAPSULE ON THE LEFT WHICH APPEARS TO BE AN OLD INFARCT. THE REMAINDER OF THE WHITE MATTER APPEARS  NORMAL. THE BRAINSTEM APPEARS NORMAL. THERE IS NO MASS OR ACUTE INFARCT. THERE IS NO PATHOLOGIC  ENHANCEMENT.  IMPRESSION  NO ACUTE ABNORMALITY.   PREVIOUS MEDICATIONS: Sinemet, Requip and artane  ALLERGIES:  No Known Allergies  CURRENT MEDICATIONS:  Current Outpatient Prescriptions on File Prior to Visit  Medication Sig Dispense Refill  . amantadine (SYMMETREL) 100 MG capsule Take 1 capsule (100 mg total) by mouth 3 (three) times daily. 90 capsule 5  . carbidopa-levodopa-entacapone (STALEVO) 25-100-200 MG per tablet Take 1 tablet by mouth 4 (four) times daily. 360 tablet 1  . digoxin (LANOXIN) 0.25 MG tablet Take 250 mcg by mouth daily.    Marland Kitchen FLUoxetine (PROZAC) 40 MG capsule Take 40 mg by mouth daily.    . metoprolol tartrate (LOPRESSOR) 25 MG tablet Take 12.5 mg by mouth 2 (two) times daily.    . simvastatin (ZOCOR) 40 MG tablet Take 40 mg by mouth every evening.    . warfarin (COUMADIN) 5 MG tablet Take 1 tablet (5 mg total) by mouth daily. Take 1 (5 mg) tablet by mouth daily.     No current facility-administered medications on file prior to visit.    PAST MEDICAL HISTORY:   Past Medical History  Diagnosis Date  . Parkinson's disease     dx circa 1990 by Dr. Thad Ranger  . Hyperlipidemia     dx circa 2008  . Hypertension     dx circa 2008  . Osteoarthritis  L knee primarily  . Atrial fibrillation     PAST SURGICAL HISTORY:   Past Surgical History  Procedure Laterality Date  . Tubal ligation      circa 1983  . Tonsillectomy      circa 1950  . Colonoscopy      2006 by Jeani HawkingPatrick Hung (clear per pt)    SOCIAL HISTORY:   History   Social History  . Marital Status: Married    Spouse Name: N/A    Number of Children: 1  . Years of Education: 16    Occupational History  . 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      clerical at [employer]   Social History Main Topics  . Smoking status: Never Smoker   . Smokeless tobacco: Never Used     Comment: reviewed 12/11/13  . Alcohol Use: 0.0 oz/week    0 Not specified per week     Comment: 3 glasses of wine a week   . Drug Use: No  . Sexual Activity: Not Currently   Other Topics Concern  . Not on file   Social History Narrative    FAMILY HISTORY:   Family Status  Relation Status Death Age  . Father Deceased 4484    AAA  . Mother Deceased 6290    "old age"  . Sister Alive 862    healthy  . Child Alive     1 son, healthy    ROS:  A complete 10 system review of systems was obtained and was unremarkable apart from what is mentioned above.  PHYSICAL EXAMINATION:    VITALS:   Filed Vitals:   04/16/14 1104  BP: 112/68  Pulse: 56    GEN:  The patient appears stated age and is in NAD. HEENT:  Normocephalic, atraumatic.  The mucous membranes are moist. The superficial temporal arteries are without ropiness or tenderness. CV:  Irregular. Lungs:  CTAB Neck/HEME:  There are no carotid bruits bilaterally. Exts:  There is LE edema  Neurological examination:  Orientation: A MoCA was performed on 12/11/13 and the patient scored 26/30.  The patient is alert and oriented x3 today.  Cranial nerves: There is good facial symmetry. The visual fields are full to confrontational testing. The speech is fluent and clear. Soft palate rises symmetrically and there is no tongue deviation. Hearing is intact to conversational tone. Sensation: Sensation is intact to light touch throughout Motor: Strength is 5/5 in the bilateral upper and lower extremities.   Shoulder shrug is equal and symmetric.  There is no pronator drift.   Movement examination: Tone: Normal tone in the bilateral upper extremities today.  Abnormal movements: There is intermittent, independent  tremor in the right and left upper extremity and more mild in the left lower extremity. Coordination:  There is mild to mod decremation with RAM's, seen bilaterally but much more pronounced in the left hemibody, Including alternating supination and pronation of the forearm, hand opening and closing, finger taps, heel taps and toe taps. Gait and Station: The patient is unable to arise out of the chair without use of her hands.  Even with her hands, she has difficulty getting up.      ASSESSMENT/PLAN:  1.  Parkinsonism.  -Need for PWC assessed and patient certainly needs this.  Very frustrated that we started this process 10 months ago and she still does not have it.  Called the wheelchair place again today, and they stated that they are not waiting  for Medicare approval.  -It is certainly possible that this represents idiopathic Parkinson's disease, but I am also considering the possibility of MSA.    -she will continue her Stalevo which she is taking at 7 AM/11 AM/3 PM/7pm, and carbidopa/levodopa 25/100 at 9 AM and 1 PM.    -requip has been d/c due to hallucinations and cognitive change.    -  I. think she would benefit greatly from inpatient rehabilitation but has refused  -She will continue on amantadine 100 mg tid. 2.  Excess EtOH use  -  She has greatly decreased her alcohol intake to 3 times per week. 3.  Depression.  -She unfortunately has stopped seeing Dr. Dellia Cloud.  Discussed with her today that I don't think that this is a good idea.  Discussed that physical and mental health are strongly intertwined.   She does now have a new caregiver, which seems to help. 4.  F/u in the next few months, sooner should new neurologic issues arise.

## 2014-04-19 ENCOUNTER — Telehealth: Payer: Self-pay | Admitting: *Deleted

## 2014-04-19 DIAGNOSIS — G20 Parkinson's disease: Secondary | ICD-10-CM

## 2014-04-19 NOTE — Telephone Encounter (Signed)
Please give patient a call back in reference to her wheelchair Call back number [phone]

## 2014-04-19 NOTE — Telephone Encounter (Signed)
Spoke with patient and she has decided to cancel wheel chair order with [employer]. [facility] through their company.

## 2014-04-19 NOTE — Telephone Encounter (Signed)
Lesly RubensteinJade this is one of Dr Don Perkingat's patient's

## 2014-05-06 ENCOUNTER — Telehealth: Payer: Self-pay | Admitting: Neurology

## 2014-05-06 NOTE — Telephone Encounter (Signed)
Spoke with patient and her husband. They state that [employer] called and said Medicare has approved wheelchair. Purchase order sent and they will have the chair in 2-3 weeks. [facility]. Email sent to [employer].

## 2014-05-06 NOTE — Telephone Encounter (Signed)
Mayford KnifeWilliams, pt's husband called wanting to let Dr. Arbutus Leasat know that the pt received her wheelchair from [employer]. He wanted to let her know that way another wheelchair is not ordered through Medicare. Call pt is you have any questions. C/B [phone]

## 2014-05-17 ENCOUNTER — Inpatient Hospital Stay (HOSPITAL_COMMUNITY)
Admission: EM | Admit: 2014-05-17 | Discharge: 2014-05-19 | DRG: 309 | Disposition: A | Discharge status: Home / Self Care | Payer: Medicare Other | Attending: Internal Medicine | Admitting: Internal Medicine

## 2014-05-17 ENCOUNTER — Encounter (HOSPITAL_COMMUNITY): Payer: Self-pay | Admitting: Emergency Medicine

## 2014-05-17 ENCOUNTER — Emergency Department (HOSPITAL_COMMUNITY): Payer: Medicare Other

## 2014-05-17 DIAGNOSIS — M1712 Unilateral primary osteoarthritis, left knee: Secondary | ICD-10-CM | POA: Diagnosis present

## 2014-05-17 DIAGNOSIS — E860 Dehydration: Secondary | ICD-10-CM | POA: Diagnosis present

## 2014-05-17 DIAGNOSIS — N390 Urinary tract infection, site not specified: Secondary | ICD-10-CM | POA: Diagnosis present

## 2014-05-17 DIAGNOSIS — F329 Major depressive disorder, single episode, unspecified: Secondary | ICD-10-CM | POA: Diagnosis present

## 2014-05-17 DIAGNOSIS — Z7901 Long term (current) use of anticoagulants: Secondary | ICD-10-CM

## 2014-05-17 DIAGNOSIS — R5383 Other fatigue: Secondary | ICD-10-CM

## 2014-05-17 DIAGNOSIS — G20 Parkinson's disease: Secondary | ICD-10-CM | POA: Diagnosis present

## 2014-05-17 DIAGNOSIS — Z66 Do not resuscitate: Secondary | ICD-10-CM | POA: Diagnosis present

## 2014-05-17 DIAGNOSIS — I10 Essential (primary) hypertension: Secondary | ICD-10-CM | POA: Diagnosis present

## 2014-05-17 DIAGNOSIS — E785 Hyperlipidemia, unspecified: Secondary | ICD-10-CM | POA: Diagnosis present

## 2014-05-17 DIAGNOSIS — I482 Chronic atrial fibrillation, unspecified: Secondary | ICD-10-CM

## 2014-05-17 DIAGNOSIS — I4891 Unspecified atrial fibrillation: Principal | ICD-10-CM | POA: Diagnosis present

## 2014-05-17 DIAGNOSIS — Z993 Dependence on wheelchair: Secondary | ICD-10-CM | POA: Diagnosis not present

## 2014-05-17 HISTORY — DX: Major depressive disorder, single episode, unspecified: F32.9

## 2014-05-17 HISTORY — DX: Depression, unspecified: F32.A

## 2014-05-17 LAB — COMPREHENSIVE METABOLIC PANEL
ALBUMIN: 3.5 g/dL (ref 3.5–5.2)
ALT: 21 U/L (ref 0–35)
AST: 20 U/L (ref 0–37)
Alkaline Phosphatase: 78 U/L (ref 39–117)
Anion gap: 11 (ref 5–15)
BUN: 11 mg/dL (ref 6–23)
CALCIUM: 9.3 mg/dL (ref 8.4–10.5)
CO2: 26 mEq/L (ref 19–32)
Chloride: 102 mEq/L (ref 96–112)
Creatinine, Ser: 0.66 mg/dL (ref 0.50–1.10)
GFR calc non Af Amer: 88 mL/min — ABNORMAL LOW (ref >90)
Glucose, Bld: 104 mg/dL — ABNORMAL HIGH (ref 70–99)
Potassium: 3.9 mEq/L (ref 3.7–5.3)
SODIUM: 139 meq/L (ref 137–147)
TOTAL PROTEIN: 7 g/dL (ref 6.0–8.3)
Total Bilirubin: 0.7 mg/dL (ref 0.3–1.2)

## 2014-05-17 LAB — URINALYSIS, ROUTINE W REFLEX MICROSCOPIC
Bilirubin Urine: NEGATIVE
Glucose, UA: NEGATIVE mg/dL
Ketones, ur: NEGATIVE mg/dL
Nitrite: POSITIVE — AB
PROTEIN: NEGATIVE mg/dL
SPECIFIC GRAVITY, URINE: 1.013 (ref 1.005–1.030)
UROBILINOGEN UA: 0.2 mg/dL (ref 0.0–1.0)
pH: 5.5 (ref 5.0–8.0)

## 2014-05-17 LAB — CBC WITH DIFFERENTIAL/PLATELET
BASOS ABS: 0 10*3/uL (ref 0.0–0.1)
Basophils Relative: 0 % (ref 0–1)
Eosinophils Absolute: 0.1 10*3/uL (ref 0.0–0.7)
Eosinophils Relative: 1 % (ref 0–5)
HEMATOCRIT: 40 % (ref 36.0–46.0)
Hemoglobin: 13.3 g/dL (ref 12.0–15.0)
LYMPHS ABS: 1.4 10*3/uL (ref 0.7–4.0)
LYMPHS PCT: 14 % (ref 12–46)
MCH: 32.2 pg (ref 26.0–34.0)
MCHC: 33.3 g/dL (ref 30.0–36.0)
MCV: 96.9 fL (ref 78.0–100.0)
MONO ABS: 0.7 10*3/uL (ref 0.1–1.0)
Monocytes Relative: 7 % (ref 3–12)
NEUTROS ABS: 7.8 10*3/uL — AB (ref 1.7–7.7)
Neutrophils Relative %: 78 % — ABNORMAL HIGH (ref 43–77)
PLATELETS: 276 10*3/uL (ref 150–400)
RBC: 4.13 MIL/uL (ref 3.87–5.11)
RDW: 13.8 % (ref 11.5–15.5)
WBC: 9.9 10*3/uL (ref 4.0–10.5)

## 2014-05-17 LAB — TROPONIN I

## 2014-05-17 LAB — URINE MICROSCOPIC-ADD ON

## 2014-05-17 LAB — PROTIME-INR
INR: 1.71 — ABNORMAL HIGH (ref 0.00–1.49)
PROTHROMBIN TIME: 20.3 s — AB (ref 11.6–15.2)

## 2014-05-17 LAB — DIGOXIN LEVEL: Digoxin Level: 0.7 ng/mL — ABNORMAL LOW (ref 0.8–2.0)

## 2014-05-17 MED ORDER — METOPROLOL TARTRATE 1 MG/ML IV SOLN
5.0000 mg | Freq: Once | INTRAVENOUS | Status: AC
  Administered 2014-05-17: 5 mg via INTRAVENOUS
  Filled 2014-05-17: qty 5

## 2014-05-17 MED ORDER — ONDANSETRON HCL 4 MG/2ML IJ SOLN: 4.0000 mg | Freq: Four times a day (QID) | INTRAMUSCULAR | Status: DC | PRN

## 2014-05-17 MED ORDER — CARBIDOPA-LEVODOPA 25-100 MG PO TABS
1.0000 | ORAL_TABLET | Freq: Four times a day (QID) | ORAL | Status: DC
  Administered 2014-05-17 – 2014-05-19 (×8): 1 via ORAL
  Filled 2014-05-17 (×8): qty 1

## 2014-05-17 MED ORDER — SODIUM CHLORIDE 0.9 % IV SOLN: INTRAVENOUS | Status: DC

## 2014-05-17 MED ORDER — CEFTRIAXONE SODIUM IN DEXTROSE 20 MG/ML IV SOLN
1.0000 g | INTRAVENOUS | Status: DC
  Administered 2014-05-18 – 2014-05-19 (×2): 1 g via INTRAVENOUS
  Filled 2014-05-17 (×2): qty 50

## 2014-05-17 MED ORDER — SIMVASTATIN 40 MG PO TABS
40.0000 mg | ORAL_TABLET | Freq: Every evening | ORAL | Status: DC
  Administered 2014-05-17 – 2014-05-18 (×2): 40 mg via ORAL
  Filled 2014-05-17 (×2): qty 1

## 2014-05-17 MED ORDER — CARBIDOPA-LEVODOPA-ENTACAPONE 25-100-200 MG PO TABS: 1.0000 | ORAL_TABLET | Freq: Four times a day (QID) | ORAL | Status: DC

## 2014-05-17 MED ORDER — DIGOXIN 250 MCG PO TABS
250.0000 ug | ORAL_TABLET | Freq: Every day | ORAL | Status: DC
  Administered 2014-05-17 – 2014-05-19 (×3): 250 ug via ORAL
  Filled 2014-05-17 (×3): qty 1

## 2014-05-17 MED ORDER — ENTACAPONE 200 MG PO TABS
200.0000 mg | ORAL_TABLET | Freq: Four times a day (QID) | ORAL | Status: DC
  Administered 2014-05-17: 200 mg via ORAL
  Filled 2014-05-17 (×4): qty 1

## 2014-05-17 MED ORDER — ACETAMINOPHEN 325 MG PO TABS: 650.0000 mg | ORAL_TABLET | Freq: Four times a day (QID) | ORAL | Status: DC | PRN

## 2014-05-17 MED ORDER — OXYCODONE HCL 5 MG PO TABS: 5.0000 mg | ORAL_TABLET | ORAL | Status: DC | PRN

## 2014-05-17 MED ORDER — SODIUM CHLORIDE 0.9 % IV SOLN
INTRAVENOUS | Status: AC
  Administered 2014-05-17 – 2014-05-18 (×2): via INTRAVENOUS

## 2014-05-17 MED ORDER — INFLUENZA VAC SPLIT QUAD 0.5 ML IM SUSY
0.5000 mL | PREFILLED_SYRINGE | INTRAMUSCULAR | Status: AC
  Administered 2014-05-18: 0.5 mL via INTRAMUSCULAR
  Filled 2014-05-17: qty 0.5

## 2014-05-17 MED ORDER — DOCUSATE SODIUM 100 MG PO CAPS
100.0000 mg | ORAL_CAPSULE | Freq: Two times a day (BID) | ORAL | Status: DC
  Administered 2014-05-17 – 2014-05-18 (×3): 100 mg via ORAL
  Filled 2014-05-17 (×3): qty 1

## 2014-05-17 MED ORDER — WARFARIN - PHARMACIST DOSING INPATIENT
Freq: Every day | Status: DC
  Administered 2014-05-17: 18:00:00

## 2014-05-17 MED ORDER — PNEUMOCOCCAL VAC POLYVALENT 25 MCG/0.5ML IJ INJ
0.5000 mL | INJECTION | INTRAMUSCULAR | Status: AC
  Administered 2014-05-18: 0.5 mL via INTRAMUSCULAR
  Filled 2014-05-17: qty 0.5

## 2014-05-17 MED ORDER — ACETAMINOPHEN 650 MG RE SUPP: 650.0000 mg | Freq: Four times a day (QID) | RECTAL | Status: DC | PRN

## 2014-05-17 MED ORDER — ENOXAPARIN SODIUM 100 MG/ML ~~LOC~~ SOLN
95.0000 mg | Freq: Two times a day (BID) | SUBCUTANEOUS | Status: DC
  Administered 2014-05-17 – 2014-05-19 (×4): 95 mg via SUBCUTANEOUS
  Filled 2014-05-17 (×4): qty 1

## 2014-05-17 MED ORDER — AMANTADINE HCL 100 MG PO CAPS
100.0000 mg | ORAL_CAPSULE | Freq: Three times a day (TID) | ORAL | Status: DC
  Administered 2014-05-17 – 2014-05-19 (×5): 100 mg via ORAL
  Filled 2014-05-17 (×9): qty 1

## 2014-05-17 MED ORDER — FLUOXETINE HCL 20 MG PO CAPS
40.0000 mg | ORAL_CAPSULE | Freq: Every day | ORAL | Status: DC
  Administered 2014-05-17 – 2014-05-19 (×3): 40 mg via ORAL
  Filled 2014-05-17 (×3): qty 2

## 2014-05-17 MED ORDER — AMANTADINE HCL 100 MG PO CAPS
100.0000 mg | ORAL_CAPSULE | Freq: Three times a day (TID) | ORAL | Status: DC
  Administered 2014-05-17: 100 mg via ORAL
  Filled 2014-05-17 (×3): qty 1

## 2014-05-17 MED ORDER — FLUOXETINE HCL 40 MG PO CAPS: 40.0000 mg | ORAL_CAPSULE | Freq: Every day | ORAL | Status: DC

## 2014-05-17 MED ORDER — ALUM & MAG HYDROXIDE-SIMETH 200-200-20 MG/5ML PO SUSP: 30.0000 mL | Freq: Four times a day (QID) | ORAL | Status: DC | PRN

## 2014-05-17 MED ORDER — ONDANSETRON HCL 4 MG PO TABS: 4.0000 mg | ORAL_TABLET | Freq: Four times a day (QID) | ORAL | Status: DC | PRN

## 2014-05-17 MED ORDER — CARBIDOPA-LEVODOPA 25-100 MG PO TABS
1.0000 | ORAL_TABLET | Freq: Four times a day (QID) | ORAL | Status: DC
  Administered 2014-05-17: 1 via ORAL
  Filled 2014-05-17 (×4): qty 1

## 2014-05-17 MED ORDER — METOPROLOL TARTRATE 12.5 MG HALF TABLET
12.5000 mg | ORAL_TABLET | Freq: Two times a day (BID) | ORAL | Status: DC
  Administered 2014-05-17 – 2014-05-19 (×3): 12.5 mg via ORAL
  Filled 2014-05-17 (×4): qty 1

## 2014-05-17 MED ORDER — DEXTROSE 5 % IV SOLN
1.0000 g | Freq: Once | INTRAVENOUS | Status: AC
  Administered 2014-05-17: 1 g via INTRAVENOUS
  Filled 2014-05-17: qty 10

## 2014-05-17 MED ORDER — METOPROLOL TARTRATE 12.5 MG HALF TABLET
12.5000 mg | ORAL_TABLET | Freq: Once | ORAL | Status: AC
  Administered 2014-05-17: 12.5 mg via ORAL
  Filled 2014-05-17: qty 1

## 2014-05-17 MED ORDER — SODIUM CHLORIDE 0.9 % IV BOLUS (SEPSIS)
500.0000 mL | Freq: Once | INTRAVENOUS | Status: AC
  Administered 2014-05-17: 500 mL via INTRAVENOUS

## 2014-05-17 MED ORDER — WARFARIN SODIUM 7.5 MG PO TABS
7.5000 mg | ORAL_TABLET | Freq: Once | ORAL | Status: AC
  Administered 2014-05-17: 7.5 mg via ORAL
  Filled 2014-05-17: qty 1

## 2014-05-17 MED ORDER — ENTACAPONE 200 MG PO TABS
200.0000 mg | ORAL_TABLET | Freq: Four times a day (QID) | ORAL | Status: DC
  Administered 2014-05-17 – 2014-05-19 (×7): 200 mg via ORAL
  Filled 2014-05-17 (×11): qty 1

## 2014-05-17 NOTE — ED Provider Notes (Signed)
CSN: [id number]     Arrival date & time 05/17/14  [id number] History   First MD Initiated Contact with Patient 05/17/14 [id number]     Chief Complaint  Patient presents with  . Atrial Fibrillation     (Consider location/radiation/quality/duration/timing/severity/associated sxs/prior Treatment) Patient is a 68 y.o. female presenting with atrial fibrillation. The history is provided by the patient.  Atrial Fibrillation Pertinent negatives include no chest pain, no abdominal pain, no headaches and no shortness of breath.   patient presents with feeling weak. Has a history of atrial fibrillation and Parkinson's disease. She states that she's been feeling bad for the last couple days. Somewhat decreased appetite. Some mild dysuria. No fevers. No chest pain. Her tremors have increased. No headache. No confusion. No sick contacts. Patient has a history of atrial fibrillation is on Coumadin.   Past Medical History  Diagnosis Date  . Parkinson's disease     dx circa 1990 by [name]  . Hyperlipidemia     dx circa 2008  . Hypertension     dx circa 2008  . Osteoarthritis     L knee primarily  . Atrial fibrillation    Past Surgical History  Procedure Laterality Date  . Tubal ligation      circa 1983  . Tonsillectomy      circa 1950  . Colonoscopy      2006 by Jeani HawkingPatrick Hung (clear per pt)   Family History  Problem Relation Age of Onset  . Coronary artery disease Father [id number]    s/p CABG  . Colon cancer Father [id number]    s/p resection  . Dementia Mother   . Restless legs syndrome Father    History  Substance Use Topics  . Smoking status: Never Smoker   . Smokeless tobacco: Never Used     Comment: reviewed 12/11/13  . Alcohol Use: 0.0 oz/week    0 Not specified per week     Comment: 3 glasses of wine a week    OB History    No data available     Review of Systems  Constitutional: Positive for fatigue. Negative for activity change and appetite change.  Eyes: Negative for pain.   Respiratory: Negative for chest tightness and shortness of breath.   Cardiovascular: Negative for chest pain and leg swelling.  Gastrointestinal: Negative for nausea, vomiting, abdominal pain and diarrhea.  Genitourinary: Positive for dysuria. Negative for flank pain.  Musculoskeletal: Negative for back pain and neck stiffness.  Skin: Negative for rash.  Neurological: Negative for weakness, numbness and headaches.  Psychiatric/Behavioral: Negative for behavioral problems.      Allergies  Review of patient's allergies indicates no known allergies.  Home Medications   Prior to Admission medications   Medication Sig Start Date End Date Taking? Authorizing Provider  amantadine (SYMMETREL) 100 MG capsule Take 1 capsule (100 mg total) by mouth 3 (three) times daily. 02/02/14  Yes Rebecca S Tat, DO  carbidopa-levodopa-entacapone (STALEVO) 25-100-200 MG per tablet Take 1 tablet by mouth 4 (four) times daily. 11/05/12  Yes Rebecca S Tat, DO  digoxin (LANOXIN) 0.25 MG tablet Take 250 mcg by mouth daily.   Yes Historical Provider, MD  FLUoxetine (PROZAC) 40 MG capsule Take 40 mg by mouth daily.   Yes Historical Provider, MD  metoprolol tartrate (LOPRESSOR) 25 MG tablet Take 12.5 mg by mouth 2 (two) times daily.   Yes Historical Provider, MD  Multiple Vitamin (MULTIVITAMIN) tablet Take 1 tablet by mouth daily.   Yes Historical  Provider, MD  simvastatin (ZOCOR) 40 MG tablet Take 40 mg by mouth every evening.   Yes Historical Provider, MD  warfarin (COUMADIN) 5 MG tablet Take 1 tablet (5 mg total) by mouth daily. Take 1 (5 mg) tablet by mouth daily. 08/16/11  Yes Standley Brookinganiel P Goodrich, MD   BP 163/124 mmHg  Pulse 112  Temp(Src) 99.9 F (37.7 C) (Rectal)  Resp 20  SpO2 100% Physical Exam  Constitutional: She appears well-developed.  HENT:  Head: Normocephalic.  Neck: Neck supple.  Cardiovascular: Normal rate.   Pulmonary/Chest: Effort normal. No respiratory distress.  Abdominal: Soft. There is no  tenderness.  Musculoskeletal: She exhibits edema.  Mild bilateral lower extremity pitting edema  Skin: Skin is warm.    ED Course  Procedures (including critical care time) Labs Review Labs Reviewed  CBC WITH DIFFERENTIAL - Abnormal; Notable for the following:    Neutrophils Relative % 78 (*)    Neutro Abs 7.8 (*)    All other components within normal limits  PROTIME-INR - Abnormal; Notable for the following:    Prothrombin Time 20.3 (*)    INR 1.71 (*)    All other components within normal limits  URINALYSIS, ROUTINE W REFLEX MICROSCOPIC - Abnormal; Notable for the following:    APPearance CLOUDY (*)    Hgb urine dipstick SMALL (*)    Nitrite POSITIVE (*)    Leukocytes, UA LARGE (*)    All other components within normal limits  COMPREHENSIVE METABOLIC PANEL - Abnormal; Notable for the following:    Glucose, Bld 104 (*)    GFR calc non Af Amer 88 (*)    All other components within normal limits  DIGOXIN LEVEL - Abnormal; Notable for the following:    Digoxin Level 0.7 (*)    All other components within normal limits  URINE MICROSCOPIC-ADD ON - Abnormal; Notable for the following:    Squamous Epithelial / LPF FEW (*)    Bacteria, UA MANY (*)    All other components within normal limits  URINE CULTURE  TROPONIN I    Imaging Review Dg Chest 2 View  05/17/2014   CLINICAL DATA:  Fatigue.  Weakness.  EXAM: CHEST  2 VIEW  COMPARISON:  08/13/2011  FINDINGS: Tortuous and atherosclerotic thoracic aorta. Upper normal heart size, cardiothoracic index 56% on the AP frontal projection. Degenerative glenohumeral arthropathy. The lungs appear clear.  IMPRESSION: 1. No active cardiopulmonary disease. There is some underlying atherosclerosis of the thoracic aorta along with thoracic spondylosis. Upper normal heart size.   Electronically Signed   By: Herbie BaltimoreWalt  Liebkemann M.D.   On: 05/17/2014 10:50     EKG Interpretation   Date/Time:  Monday May 17 2014 09:42:39 EST Ventricular Rate:   137 PR Interval:    QRS Duration: 118 QT Interval:  425 QTC Calculation: 642 R Axis:   65 Text Interpretation:  severe artifact due to parkinsons.  Underlying Afib  Artifact in lead(s) I III aVL V1 V2 Confirmed by Rubin PayorPICKERING  MD, Harrold DonathNATHAN  [phone] on 05/17/2014 12:21:13 PM      MDM   Final diagnoses:  Fatigue  Lower urinary tract infection  Chronic atrial fibrillation    Patient with generalized weakness. Has urinary tract infection which is a likely cause. Initial complaint was A. fib with RVR, however most of the apparent fast rate is artifact. Her Parkinson's appears to show up as beats. It looks that she has an underlying A. fib, which is known, she is not tachycardic. Will  admit to internal medicine and urine cultures have been sent.    Juliet Rude. Rubin Payor, MD 05/17/14 [phone]

## 2014-05-17 NOTE — ED Notes (Signed)
Pt transported to xray 

## 2014-05-17 NOTE — H&P (Signed)
[facility] History and Physical                                                                                    Patient Demographics  Christy Pham, is a 68 y.o. female  MRN: [id number]   DOB - 02/01/1946  Admit Date - 05/17/2014  Outpatient Primary MD for the patient is Pamelia Hoit, MD   With History of -  Past Medical History  Diagnosis Date  . Parkinson's disease     dx circa 1990 by Dr. Thad Ranger  . Hyperlipidemia     dx circa 2008  . Hypertension     dx circa 2008  . Osteoarthritis     L knee primarily  . Atrial fibrillation       Past Surgical History  Procedure Laterality Date  . Tubal ligation      circa 1983  . Tonsillectomy      circa 1950  . Colonoscopy      2006 by Jeani Hawking (clear per pt)    in for   Chief Complaint  Patient presents with  . Atrial Fibrillation     HPI  Christy Pham  is a 68 y.o. female, with a past medical history of Parkinson's disease and atrial fibrillation (maintained on Coumadin), she is wheelchair-bound. She presents to the emergency department today with a racing heartbeat and feeling abnormally.  The patient reports that she ran out of metoprolol on December 20. She denies any other symptoms other than anorexia and a twinge after she finishes her urine stream.  EMS reports pulse rates of 140-190 during transportation. In the ER the patient was found to have an INR of 1.71 (slightly sub therapeutic) and a digoxin level of .7 (also sub therapeutic).  U/A appears positive for infection.  Review of Systems    In addition to the HPI above,  No Fever-chills, No Headache, No changes with Vision or hearing, No problems swallowing food or Liquids, No Chest pain, Cough or Shortness of Breath, No Abdominal pain, No Nausea or Vomiting, Bowel movements are regular, No Blood in stool or Urine, No dysuria, No new skin rashes or bruises, No new joints pains-aches,  No new weakness, tingling, numbness in any  extremity, No recent weight gain or loss, No polyuria, polydypsia or polyphagia, No significant Mental Stressors.  A full 10 point Review of Systems was done, except as stated above, all other Review of Systems were negative.   Social History History  Substance Use Topics  . Smoking status: Never Smoker   . Smokeless tobacco: Never Used     Comment: reviewed 12/11/13  . Alcohol Use: 0.0 oz/week    0 Not specified per week     Comment: 3 glasses of wine a week      Family History Family History  Problem Relation Age of Onset  . Coronary artery disease Father 51    s/p CABG  . Colon cancer Father 98    s/p resection  . Dementia Mother   . Restless legs syndrome Father      Prior to Admission medications   Medication Sig Start Date End Date Taking? Authorizing Provider  amantadine (SYMMETREL) 100 MG capsule Take 1 capsule (100 mg total) by mouth 3 (three) times daily. 02/02/14  Yes Rebecca S Tat, DO  carbidopa-levodopa-entacapone (STALEVO) 25-100-200 MG per tablet Take 1 tablet by mouth 4 (four) times daily. 11/05/12  Yes Rebecca S Tat, DO  digoxin (LANOXIN) 0.25 MG tablet Take 250 mcg by mouth daily.   Yes Historical Provider, MD  FLUoxetine (PROZAC) 40 MG capsule Take 40 mg by mouth daily.   Yes Historical Provider, MD  metoprolol tartrate (LOPRESSOR) 25 MG tablet Take 12.5 mg by mouth 2 (two) times daily.   Yes Historical Provider, MD  Multiple Vitamin (MULTIVITAMIN) tablet Take 1 tablet by mouth daily.   Yes Historical Provider, MD  simvastatin (ZOCOR) 40 MG tablet Take 40 mg by mouth every evening.   Yes Historical Provider, MD  warfarin (COUMADIN) 5 MG tablet Take 1 tablet (5 mg total) by mouth daily. Take 1 (5 mg) tablet by mouth daily. 08/16/11  Yes Standley Brookinganiel P Goodrich, MD    No Known Allergies  Physical Exam  Vitals  Blood pressure 148/120, pulse 110, temperature 99.9 F (37.7 C), temperature source Rectal, resp. rate 20, SpO2 99 %.   General:  lying in bed,  extremities tremoring and diaphoretic, husband at bedside.   Psych:  Normal affect and insight, Not Suicidal or Homicidal, Awake Alert, Oriented X 3.  Neuro:   No acute F.N deficits, ALL C.Nerves Intact, Strength 5/5 all 4 extremities, Sensation intact all 4 extremities.  ENT:  Ears and Eyes appear Normal, Conjunctivae clear, PERRLA. Moist Oral Mucosa.  Neck:  Supple Neck, No JVD, No cervical lymphadenopathy appreciated  Respiratory:  Symmetrical Chest wall movement, Good air movement bilaterally, CTAB.  Cardiac: Irregularly irregular, No Gallops, Rubs or Murmurs, No Parasternal Heave.  Abdomen:   obese, Positive Bowel Sounds, Abdomen Soft, Non tender, No organomegaly appreciated  Skin:  No Cyanosis, Normal Skin Turgor, No Skin Rash or Bruise.  Patient is flushed pink, her chest is diaphoretic, she is warm to touch, she has changes of venous stasis on her lower extremities bilaterally.    Extremities:  patient is confined to wheelchair. Her lower extremities showed no edema.   Data Review  CBC  Recent Labs Lab 05/17/14 1120  WBC 9.9  HGB 13.3  HCT 40.0  PLT 276  MCV 96.9  MCH 32.2  MCHC 33.3  RDW 13.8  LYMPHSABS 1.4  MONOABS 0.7  EOSABS 0.1  BASOSABS 0.0   ------------------------------------------------------------------------------------------------------------------  Chemistries   Recent Labs Lab 05/17/14 1120  NA 139  K 3.9  CL 102  CO2 26  GLUCOSE 104*  BUN 11  CREATININE 0.66  CALCIUM 9.3  AST 20  ALT 21  ALKPHOS 78  BILITOT 0.7     Coagulation profile  Recent Labs Lab 05/17/14 1120  INR 1.71*    Cardiac Enzymes  Recent Labs Lab 05/17/14 1120  TROPONINI <0.30    Urinalysis    Component Value Date/Time   COLORURINE YELLOW 05/17/2014 1221   APPEARANCEUR CLOUDY* 05/17/2014 1221   LABSPEC 1.013 05/17/2014 1221   PHURINE 5.5 05/17/2014 1221   GLUCOSEU NEGATIVE 05/17/2014 1221   HGBUR SMALL* 05/17/2014 1221   BILIRUBINUR NEGATIVE  05/17/2014 1221   KETONESUR NEGATIVE 05/17/2014 1221   PROTEINUR NEGATIVE 05/17/2014 1221   UROBILINOGEN 0.2 05/17/2014 1221   NITRITE POSITIVE* 05/17/2014 1221   LEUKOCYTESUR LARGE* 05/17/2014 1221    ----------------------------------------------------------------------------------------------------------------  Imaging results:   Dg Chest 2 View  05/17/2014   CLINICAL DATA:  Fatigue.  Weakness.  EXAM: CHEST  2 VIEW  COMPARISON:  08/13/2011  FINDINGS: Tortuous and atherosclerotic thoracic aorta. Upper normal heart size, cardiothoracic index 56% on the AP frontal projection. Degenerative glenohumeral arthropathy. The lungs appear clear.  IMPRESSION: 1. No active cardiopulmonary disease. There is some underlying atherosclerosis of the thoracic aorta along with thoracic spondylosis. Upper normal heart size.   Electronically Signed   By: Herbie BaltimoreWalt  Liebkemann M.D.   On: 05/17/2014 10:50    My personal review of EKG: Irregular rate and regular rhythm, heavy artifact due to parkinsonian tremor    Assessment & Plan  Principal Problem:   Atrial fibrillation with RVR Active Problems:   UTI (lower urinary tract infection)   A. fib with RVR Patient ran out of metoprolol at home. Her pulse rate may have been worsened by her current infection and a subtherapeutic digoxin level. Treated with IV fluids and a dose of IV metoprolol will start her home medications. She does not have a cardiologist rather her primary care physician, Benedetto GoadFred Wilson takes care of her A. Fib. Coumadin per pharmacy  Subtherapeutic INR Coumadin per pharmacy  Subtherapeutic digoxin level Restart digoxin at 0.25 daily  Urinary tract infection Cultures pending Started on Rocephin  Parkinson's Per patient is at baseline. Continue carbidopa-levodopa-entacapone, and amantadine.  Hypertension Continue metoprolol  HLD Continue simvastatin.   DVT Prophylaxis:  Coumadin and lovenox per pharmacy.  AM Labs Ordered,  also please review Full Orders  Family Communication:   Husband at bedside.   Code Status:  DNR  Likely DC to  Home. Condition:  Guarded.  Time spent in minutes : 60    York, Tora KindredMarianne L PA-C on 05/17/2014 at 3:35 PM  Between 7am to 7pm - Pager - [phone]  After 7pm go to [email or web address] - password [email or web address]  And look for the night coverage person covering me after hours  [facility] Office  [phone]

## 2014-05-17 NOTE — ED Notes (Signed)
In and Out Catheter performed, pt changed, resting quietly at the time. Vital signs stable.

## 2014-05-17 NOTE — ED Notes (Signed)
Pt resting quietly at the time. Denies pain. No signs of distress noted. Family at bedside. VSS.

## 2014-05-17 NOTE — Progress Notes (Signed)
ANTICOAGULATION CONSULT NOTE - Initial Consult  Pharmacy Consult for loveox/coumadin Indication: atrial fibrillation  No Known Allergies  Patient Measurements: Wt= 210lb (95.45kg)  Vital Signs: Temp: 99.9 F (37.7 C) (12/21 1013) Temp Source: Rectal (12/21 1013) BP: 163/124 mmHg (12/21 1542) Pulse Rate: 112 (12/21 1542)  Labs:  Recent Labs  05/17/14 1120  HGB 13.3  HCT 40.0  PLT 276  LABPROT 20.3*  INR 1.71*  CREATININE 0.66  TROPONINI <0.30    CrCl cannot be calculated (Unknown ideal weight.).   Medical History: Past Medical History  Diagnosis Date  . Parkinson's disease     dx circa 1990 by [name]  . Hyperlipidemia     dx circa 2008  . Hypertension     dx circa 2008  . Osteoarthritis     L knee primarily  . Atrial fibrillation     Medications:  Prescriptions prior to admission  Medication Sig Dispense Refill Last Dose  . amantadine (SYMMETREL) 100 MG capsule Take 1 capsule (100 mg total) by mouth 3 (three) times daily. 90 capsule 5 05/16/2014 at Unknown time  . carbidopa-levodopa-entacapone (STALEVO) 25-100-200 MG per tablet Take 1 tablet by mouth 4 (four) times daily. 360 tablet 1 05/16/2014 at Unknown time  . digoxin (LANOXIN) 0.25 MG tablet Take 250 mcg by mouth daily.   05/16/2014 at Unknown time  . FLUoxetine (PROZAC) 40 MG capsule Take 40 mg by mouth daily.   05/16/2014 at Unknown time  . metoprolol tartrate (LOPRESSOR) 25 MG tablet Take 12.5 mg by mouth 2 (two) times daily.   05/15/2014 at 800  . Multiple Vitamin (MULTIVITAMIN) tablet Take 1 tablet by mouth daily.   Past Week at Unknown time  . simvastatin (ZOCOR) 40 MG tablet Take 40 mg by mouth every evening.   05/16/2014 at Unknown time  . warfarin (COUMADIN) 5 MG tablet Take 1 tablet (5 mg total) by mouth daily. Take 1 (5 mg) tablet by mouth daily.   05/16/2014 at Unknown time   Scheduled:  . sodium chloride   Intravenous STAT  . cefTRIAXone (ROCEPHIN)  IV  1 g Intravenous Q24H  .  digoxin  250 mcg Oral Daily  . docusate sodium  100 mg Oral BID  . FLUoxetine  40 mg Oral Daily  . metoprolol tartrate  12.5 mg Oral BID  . metoprolol tartrate  12.5 mg Oral Once  . simvastatin  40 mg Oral QPM    Assessment: 68 yo female here with afib. She is on coumadin PTA and INR is below goal at 1.71. Pharmacy has been consulted to dose coumadin and lovenox. SCr= 0.66 and CrCl ~ 80-90. She is also noted with UTI and on antibiotics (may effect coumadin sensitivity)  Home coumadin dose: 5mg /day (last taken 05/16/14)  Goal of Therapy:  INR 2-3 Heparin level 0.3-0.7 units/ml Monitor platelets by anticoagulation protocol: Yes   Plan:  -Lovenox 95mg  sq 12hr -Coumadin 7.5mg  po today -Daily PT/INR -CBC every 3 days  Harland Germanndrew Eytan Carrigan, Pharm D 05/17/2014 4:37 PM

## 2014-05-17 NOTE — ED Notes (Signed)
Per EMS, pt called out due to not feeling well. Pt has hx of a-fib and had a rate of 130 upon ems arrival. Pt denies SOB, CP and N/V/D. NAD a this time. Pts rate is from 140-190 at this time. Pt alert x 4.

## 2014-05-17 NOTE — ED Notes (Signed)
Pt transported to unit on cardiac monitor. 

## 2014-05-17 NOTE — ED Notes (Signed)
Attempted to call report. Nurse to call back. 

## 2014-05-18 DIAGNOSIS — I4891 Unspecified atrial fibrillation: Secondary | ICD-10-CM | POA: Diagnosis not present

## 2014-05-18 LAB — BASIC METABOLIC PANEL
Anion gap: 6 (ref 5–15)
BUN: 9 mg/dL (ref 6–23)
CHLORIDE: 105 meq/L (ref 96–112)
CO2: 29 mmol/L (ref 19–32)
Calcium: 8.5 mg/dL (ref 8.4–10.5)
Creatinine, Ser: 0.76 mg/dL (ref 0.50–1.10)
GFR calc Af Amer: >90 mL/min (ref >90)
GFR calc non Af Amer: 84 mL/min — ABNORMAL LOW (ref >90)
GLUCOSE: 109 mg/dL — AB (ref 70–99)
Potassium: 3.8 mmol/L (ref 3.5–5.1)
SODIUM: 140 mmol/L (ref 135–145)

## 2014-05-18 LAB — CBC
HCT: 38.8 % (ref 36.0–46.0)
HEMOGLOBIN: 12.5 g/dL (ref 12.0–15.0)
MCH: 31.2 pg (ref 26.0–34.0)
MCHC: 32.2 g/dL (ref 30.0–36.0)
MCV: 96.8 fL (ref 78.0–100.0)
Platelets: 259 10*3/uL (ref 150–400)
RBC: 4.01 MIL/uL (ref 3.87–5.11)
RDW: 13.9 % (ref 11.5–15.5)
WBC: 7.6 10*3/uL (ref 4.0–10.5)

## 2014-05-18 LAB — PROTIME-INR
INR: 1.67 — ABNORMAL HIGH (ref 0.00–1.49)
Prothrombin Time: 19.8 seconds — ABNORMAL HIGH (ref 11.6–15.2)

## 2014-05-18 MED ORDER — SODIUM CHLORIDE 0.9 % IV SOLN: INTRAVENOUS | Status: AC

## 2014-05-18 MED ORDER — WARFARIN SODIUM 7.5 MG PO TABS
7.5000 mg | ORAL_TABLET | Freq: Once | ORAL | Status: AC
Start: 2014-05-18 — End: 2014-05-18
  Administered 2014-05-18: 7.5 mg via ORAL
  Filled 2014-05-18: qty 1

## 2014-05-18 MED ORDER — POTASSIUM CHLORIDE CRYS ER 20 MEQ PO TBCR
40.0000 meq | EXTENDED_RELEASE_TABLET | Freq: Once | ORAL | Status: AC
  Administered 2014-05-18: 40 meq via ORAL
  Filled 2014-05-18: qty 2

## 2014-05-18 NOTE — Care Management Note (Signed)
    Page 1 of 1   05/19/2014     2:16:48 PM CARE MANAGEMENT NOTE 05/19/2014  Patient:  Elmer SowMULLINS,Chardonay B   Account Number:  [id number]  Date Initiated:  05/18/2014  Documentation initiated by:  GRAVES-BIGELOW,BRENDA  Subjective/Objective Assessment:   Pt admitted for Afib. Wheelchair-bound at home with husband. Pt states she sleeps in lift chair. Per pt she has PCS services via  [facility] 6 days week.     Action/Plan:   PT to consult with pt for disposition needs. Pt states she does her own medications.  CM will continue to monitor.   Anticipated DC Date:  05/19/2014   Anticipated DC Plan:  HOME W HOME HEALTH SERVICES      DC Planning Services  CM consult      [facility]  Resumption Of Svcs/PTA Provider   Choice offered to / List presented to:  C-1 Patient        HH arranged  HH-2 PT  HH-1 RN      [facility]  [facility]   Status of service:  Completed, signed off Medicare Important Message given?  NA - LOS <3 / Initial given by admissions (If response is "NO", the following Medicare IM given date fields will be blank) Date Medicare IM given:   Medicare IM given by:   Date Additional Medicare IM given:   Additional Medicare IM given by:    Discharge Disposition:  HOME W HOME HEALTH SERVICES  Per UR Regulation:  Reviewed for med. necessity/level of care/duration of stay  If discussed at Long Length of Stay Meetings, dates discussed:    Comments:  05/19/14 Sidney AceJulie Naoki Migliaccio, RN, BSN [phone] Pt for dc [facility] today [facility]  Notified [facility] of dc today and resumption of care orders.  Pt denies any DME needs for home.

## 2014-05-18 NOTE — Progress Notes (Signed)
Patient Demographics  Christy HuffCarol Pham, is a 68 y.o. female, DOB - 03/26/1946, ZOX:[id number]  Admit date - 05/17/2014   Admitting Physician Elease EtienneAnand D Hongalgi, MD  Outpatient Primary MD for the patient is Pamelia HoitWILSON,FRED HENRY, MD  LOS - 1   Chief Complaint  Patient presents with  . Atrial Fibrillation      Admission history of present illness/brief narrative:  Christy HuffCarol Pham is a 68 y.o. female, with a past medical history of Parkinson's disease and atrial fibrillation (maintained on Coumadin), she is wheelchair-bound. She presents to the emergency department  with palpitation. The patient reports that she ran out of metoprolol on December 20. She denies any other symptoms other than anorexia and a twinge after she finishes her urine stream. EMS reports pulse rates of 140-190 during transportation. In the ER the patient was found to have an INR of 1.71 (slightly sub therapeutic) and a digoxin level of .7 (also sub therapeutic). U/A appears positive for infection.  Subjective:   Christy Pham today has, No headache, No chest pain, No abdominal pain - No Nausea, No new weakness tingling or numbness, No Cough - SOB.   Assessment & Plan    Principal Problem:   Atrial fibrillation with RVR Active Problems:   UTI (lower urinary tract infection)   Dehydration   Essential hypertension  A. fib with RVR - Patient ran out of metoprolol at home, this is most likely causing her A. fib with RVR during initial presentation. - Appears better controlled after she was started on metoprolol. -Continue to monitor on telemetry. -As well as appears to be clinically dehydrated, has UTI, most likely contributing to her symptoms, continue with IV fluids and UTI treatment.  Subtherapeutic INR Coumadin per pharmacy, being bridged with Lovenox.  Subtherapeutic digoxin level Restart  digoxin at 0.25 daily  Urinary tract infection - Continue with IV Rocephin. -Urine culture no growth to date.  Parkinson's -Per patient is at baseline. -Continue carbidopa-levodopa-entacapone, and amantadine. -PT consult  Hypertension -Continue metoprolol  HLD -Continue simvastatin  Code Status: DO NOT RESUSCITATE  Family Communication: None at bedside  Disposition Plan: Home in 24 hour if remains stable.   Procedures  None   Consults   None   Medications  Scheduled Meds: . amantadine  100 mg Oral TID  . carbidopa-levodopa  1 tablet Oral QID   And  . entacapone  200 mg Oral QID  . cefTRIAXone (ROCEPHIN)  IV  1 g Intravenous Q24H  . digoxin  250 mcg Oral Daily  . docusate sodium  100 mg Oral BID  . enoxaparin (LOVENOX) injection  95 mg Subcutaneous Q12H  . FLUoxetine  40 mg Oral Daily  . metoprolol tartrate  12.5 mg Oral BID  . potassium chloride  40 mEq Oral Once  . simvastatin  40 mg Oral QPM  . warfarin  7.5 mg Oral ONCE-1800  . Warfarin - Pharmacist Dosing Inpatient   Does not apply q1800   Continuous Infusions:  PRN Meds:.acetaminophen **OR** acetaminophen, alum & mag hydroxide-simeth, ondansetron **OR** ondansetron (ZOFRAN) IV, oxyCODONE  DVT Prophylaxis  patient is on warfarin, currently being bridged with Lovenox for subtherapeutic INR.  Lab Results  Component Value Date   PLT 259 05/18/2014    Antibiotics  Anti-infectives    Start     Dose/Rate Route Frequency Ordered Stop   05/18/14 1300  cefTRIAXone (ROCEPHIN) 1 g in dextrose 5 % 50 mL IVPB - Premix     1 g100 mL/hr over 30 Minutes Intravenous Every 24 hours 05/17/14 1630     05/17/14 1330  cefTRIAXone (ROCEPHIN) 1 g in dextrose 5 % 50 mL IVPB     1 g100 mL/hr over 30 Minutes Intravenous  Once 05/17/14 1323 05/17/14 1508          Objective:   Filed Vitals:   05/17/14 1430 05/17/14 1542 05/17/14 2208 05/18/14 0640  BP: 148/120 163/124 135/89 124/77  Pulse: 110 112 72 94  Temp:    98.5 F (36.9 C) 98.1 F (36.7 C)  TempSrc:   Oral Oral  Resp: 20 20 20 24   Height:    5\' 4"  (1.626 m)  Weight:    97.705 kg (215 lb 6.4 oz)  SpO2: 99% 100% 94% 94%    Wt Readings from Last 3 Encounters:  05/18/14 97.705 kg (215 lb 6.4 oz)  08/16/11 85.4 kg (188 lb 4.4 oz)  02/02/10 108.863 kg (240 lb)     Intake/Output Summary (Last 24 hours) at 05/18/14 1037 Last data filed at 05/18/14 0729  Gross per 24 hour  Intake    660 ml  Output      0 ml  Net    660 ml     Physical Exam  Awake Alert, Oriented X 3, No new F.N deficits, Normal affect [place].AT,PERRAL Supple Neck,No JVD, No cervical lymphadenopathy appriciated.  Symmetrical Chest wall movement, Good air movement bilaterally, CTAB Irregular irregular,No Gallops,Rubs or new Murmurs, No Parasternal Heave +ve B.Sounds, Abd Soft, No tenderness, No organomegaly appriciated, No rebound - guarding or rigidity. No Cyanosis, Clubbing or edema, No new Rash or bruise  , has Parkinson disease with tremors.   Data Review   Micro Results No results found for this or any previous visit (from the past 240 hour(s)).  Radiology Reports Dg Chest 2 View  05/17/2014   CLINICAL DATA:  Fatigue.  Weakness.  EXAM: CHEST  2 VIEW  COMPARISON:  08/13/2011  FINDINGS: Tortuous and atherosclerotic thoracic aorta. Upper normal heart size, cardiothoracic index 56% on the AP frontal projection. Degenerative glenohumeral arthropathy. The lungs appear clear.  IMPRESSION: 1. No active cardiopulmonary disease. There is some underlying atherosclerosis of the thoracic aorta along with thoracic spondylosis. Upper normal heart size.   Electronically Signed   By: Herbie BaltimoreWalt  Liebkemann M.D.   On: 05/17/2014 10:50    CBC  Recent Labs Lab 05/17/14 1120 05/18/14 0455  WBC 9.9 7.6  HGB 13.3 12.5  HCT 40.0 38.8  PLT 276 259  MCV 96.9 96.8  MCH 32.2 31.2  MCHC 33.3 32.2  RDW 13.8 13.9  LYMPHSABS 1.4  --   MONOABS 0.7  --   EOSABS 0.1  --   BASOSABS 0.0   --     Chemistries   Recent Labs Lab 05/17/14 1120 05/18/14 0455  NA 139 140  K 3.9 3.8  CL 102 105  CO2 26 29  GLUCOSE 104* 109*  BUN 11 9  CREATININE 0.66 0.76  CALCIUM 9.3 8.5  AST 20  --   ALT 21  --   ALKPHOS 78  --   BILITOT 0.7  --    ------------------------------------------------------------------------------------------------------------------ estimated creatinine clearance is 75.3 mL/min (by C-G formula based on Cr of 0.76). ------------------------------------------------------------------------------------------------------------------ No results for input(s): HGBA1C  in the last 72 hours. ------------------------------------------------------------------------------------------------------------------ No results for input(s): CHOL, HDL, LDLCALC, TRIG, CHOLHDL, LDLDIRECT in the last 72 hours. ------------------------------------------------------------------------------------------------------------------ No results for input(s): TSH, T4TOTAL, T3FREE, THYROIDAB in the last 72 hours.  Invalid input(s): FREET3 ------------------------------------------------------------------------------------------------------------------ No results for input(s): VITAMINB12, FOLATE, FERRITIN, TIBC, IRON, RETICCTPCT in the last 72 hours.  Coagulation profile  Recent Labs Lab 05/17/14 1120 05/18/14 0455  INR 1.71* 1.67*    No results for input(s): DDIMER in the last 72 hours.  Cardiac Enzymes  Recent Labs Lab 05/17/14 1120  TROPONINI <0.30   ------------------------------------------------------------------------------------------------------------------ Invalid input(s): POCBNP     Time Spent in minutes   30 minutes   Rhealynn Myhre M.D on 05/18/2014 at 10:37 AM  Between 7am to 7pm - Pager - [phone]  After 7pm go to [email or web address] - password [email or web address]  And look for the night coverage person covering for me after hours  [facility] Office   [phone]   **Disclaimer: This note may have been dictated with voice recognition software. Similar sounding words can inadvertently be transcribed and this note may contain transcription errors which may not have been corrected upon publication of note.**

## 2014-05-18 NOTE — Progress Notes (Signed)
PT Cancellation Note  Patient Details Name: Christy SowCarol B Schor MRN: [id number] DOB: 03/02/1946   Cancelled Treatment:    Reason Eval/Treat Not Completed: Medical issues which prohibited therapy (pt HR 69 on arrival with AFib jumping 150-189 and not appropriate for mobility at this time. May be secondary to tremors but RN and MD request EKG prior to mobility)   Toney Sangabor, Cadyn Fann [facility] Center[facility]eth 05/18/2014, 1:40 PM Delaney MeigsMaija Tabor Mi Balla, PT [phone]

## 2014-05-18 NOTE — Progress Notes (Signed)
ANTICOAGULATION CONSULT NOTE  Pharmacy Consult for warfarin Indication: atrial fibrillation  No Known Allergies  Patient Measurements: Height: 5\' 4"  (162.6 cm) Weight: 215 lb 6.4 oz (97.705 kg) IBW/kg (Calculated) : 54.7   Vital Signs: Temp: 98.1 F (36.7 C) (12/22 0640) Temp Source: Oral (12/22 0640) BP: 124/77 mmHg (12/22 0640) Pulse Rate: 94 (12/22 0640)  Labs:  Recent Labs  05/17/14 1120 05/18/14 0455  HGB 13.3 12.5  HCT 40.0 38.8  PLT 276 259  LABPROT 20.3* 19.8*  INR 1.71* 1.67*  CREATININE 0.66 0.76  TROPONINI <0.30  --     Estimated Creatinine Clearance: 75.3 mL/min (by C-G formula based on Cr of 0.76).  Assessment: 89 YOF admitted in AFib with some RVR as she had run out of Lopressor PTA. She is also on digoxin (level low at 0.7)- both have been resumed here. Noted with low INR on admission at 1.71 and warfarin + therapeutic Lovenox dosing was started. Home dose of warfarin is 5mg  daily. She received warfarin 7.5mg  po x1 last evening. INR today is still low at 1.67 (full effects of last night's dose not yet seen). CBC is WNL and no bleeding noted.  Goal of Therapy:  INR 2-3 Anti-Xa level 0.6-1 units/ml 4hrs after LMWH dose given Monitor platelets by anticoagulation protocol: Yes   Plan:  1. Warfarin 7.5mg  po x1 tonight 2. Continue Lovenox 95mg  subq q12h 3. Daily INR, CBC q72h 4. Follow for s/s bleeding  Murray Durrell D. Kenia Teagarden, PharmD, BCPS Clinical Pharmacist Pager: [phone] 05/18/2014 10:30 AM

## 2014-05-19 LAB — BASIC METABOLIC PANEL
Anion gap: 6 (ref 5–15)
BUN: 9 mg/dL (ref 6–23)
CO2: 28 mmol/L (ref 19–32)
Calcium: 8.5 mg/dL (ref 8.4–10.5)
Chloride: 103 mEq/L (ref 96–112)
Creatinine, Ser: 0.69 mg/dL (ref 0.50–1.10)
GFR, EST NON AFRICAN AMERICAN: 87 mL/min — AB (ref >90)
GLUCOSE: 133 mg/dL — AB (ref 70–99)
Potassium: 3.7 mmol/L (ref 3.5–5.1)
SODIUM: 137 mmol/L (ref 135–145)

## 2014-05-19 LAB — PROTIME-INR
INR: 1.79 — ABNORMAL HIGH (ref 0.00–1.49)
PROTHROMBIN TIME: 20.9 s — AB (ref 11.6–15.2)

## 2014-05-19 LAB — CBC
HEMATOCRIT: 40 % (ref 36.0–46.0)
HEMOGLOBIN: 12.9 g/dL (ref 12.0–15.0)
MCH: 30.9 pg (ref 26.0–34.0)
MCHC: 32.3 g/dL (ref 30.0–36.0)
MCV: 95.9 fL (ref 78.0–100.0)
Platelets: 242 10*3/uL (ref 150–400)
RBC: 4.17 MIL/uL (ref 3.87–5.11)
RDW: 13.7 % (ref 11.5–15.5)
WBC: 7.6 10*3/uL (ref 4.0–10.5)

## 2014-05-19 MED ORDER — WARFARIN SODIUM 5 MG PO TABS: 5.0000 mg | ORAL_TABLET | Freq: Every day | ORAL | Status: DC

## 2014-05-19 MED ORDER — METOPROLOL TARTRATE 25 MG PO TABS: 12.5000 mg | ORAL_TABLET | Freq: Two times a day (BID) | ORAL | Status: AC

## 2014-05-19 MED ORDER — LEVOFLOXACIN 500 MG PO TABS: 500.0000 mg | ORAL_TABLET | Freq: Every day | ORAL | Status: AC

## 2014-05-19 MED ORDER — OFF THE BEAT BOOK
Freq: Once | Status: AC
  Administered 2014-05-19: 14:00:00
  Filled 2014-05-19: qty 1

## 2014-05-19 MED ORDER — WARFARIN SODIUM 5 MG PO TABS
5.0000 mg | ORAL_TABLET | Freq: Every day | ORAL | Status: DC
Start: 2014-05-19 — End: 2020-01-02

## 2014-05-19 NOTE — Progress Notes (Signed)
ANTICOAGULATION CONSULT NOTE  Pharmacy Consult for warfarin Indication: atrial fibrillation  No Known Allergies  Patient Measurements: Height: 5\' 4"  (162.6 cm) Weight: 215 lb 6.4 oz (97.705 kg) IBW/kg (Calculated) : 54.7   Vital Signs: Temp: 97.6 F (36.4 C) (12/23 0500) Temp Source: Oral (12/23 0500) BP: 138/105 mmHg (12/23 0500) Pulse Rate: 91 (12/23 0500)  Labs:  Recent Labs  05/17/14 1120 05/18/14 0455 05/19/14 0500  HGB 13.3 12.5 12.9  HCT 40.0 38.8 40.0  PLT 276 259 242  LABPROT 20.3* 19.8* 20.9*  INR 1.71* 1.67* 1.79*  CREATININE 0.66 0.76 0.69  TROPONINI <0.30  --   --     Estimated Creatinine Clearance: 75.3 mL/min (by C-G formula based on Cr of 0.69).  Assessment: [age] YOF admitted in AFib with some RVR as she had run out of Lopressor PTA. She is also on digoxin (level low at 0.7)- both have been resumed here. Noted with low INR on admission at 1.71 and warfarin + therapeutic Lovenox dosing was started. Home dose of warfarin is 5mg  daily.  INR this morning 1.79. Per verbal order from Dr. Randol KernElgergawy, will discontinue Lovenox.   Goal of Therapy:  INR 2-3 Anti-Xa level 0.6-1 units/ml 4hrs after LMWH dose given Monitor platelets by anticoagulation protocol: Yes   Plan:  1. Discontinue Lovenox 2. Resume home dose of warfarin 5mg  po daily starting tonight 3. Recommend INR check on Monday 12/28 if discharged today or tomorrow 4. Daily INR while in hospital 5. Follow for s/s bleeding   Mousa Prout D. Marcellous Snarski, PharmD, BCPS Clinical Pharmacist Pager: [phone] 05/19/2014 10:19 AM

## 2014-05-19 NOTE — Discharge Instructions (Signed)
Follow with Primary MD Pamelia HoitWILSON,FRED HENRY, MD in 7 days   Get CBC, CMP, 2 view Chest X ray checked  by Primary MD next visit.    Activity: As tolerated with Full fall precautions use walker/cane & assistance as needed   Disposition Home    Diet: Heart Healthy  , with feeding assistance and aspiration precautions as needed.  For Heart failure patients - Check your Weight same time everyday, if you gain over 2 pounds, or you develop in leg swelling, experience more shortness of breath or chest pain, call your Primary MD immediately. Follow Cardiac Low Salt Diet and 1.8 lit/day fluid restriction.   On your next visit with your primary care physician please Get Medicines reviewed and adjusted.   Please request your Prim.MD to go over all Hospital Tests and Procedure/Radiological results at the follow up, please get all Hospital records sent to your Prim MD by signing hospital release before you go home.   If you experience worsening of your admission symptoms, develop shortness of breath, life threatening emergency, suicidal or homicidal thoughts you must seek medical attention immediately by calling 911 or calling your MD immediately  if symptoms less severe.  You Must read complete instructions/literature along with all the possible adverse reactions/side effects for all the Medicines you take and that have been prescribed to you. Take any new Medicines after you have completely understood and accpet all the possible adverse reactions/side effects.   Do not drive, operating heavy machinery, perform activities at heights, swimming or participation in water activities or provide baby sitting services if your were admitted for syncope or siezures until you have seen by Primary MD or a Neurologist and advised to do so again.  Do not drive when taking Pain medications.    Do not take more than prescribed Pain, Sleep and Anxiety Medications  Special Instructions: If you have smoked or chewed  Tobacco  in the last 2 yrs please stop smoking, stop any regular Alcohol  and or any Recreational drug use.  Wear Seat belts while driving.   Please note  You were cared for by a hospitalist during your hospital stay. If you have any questions about your discharge medications or the care you received while you were in the hospital after you are discharged, you can call the unit and asked to speak with the hospitalist on call if the hospitalist that took care of you is not available. Once you are discharged, your primary care physician will handle any further medical issues. Please note that NO REFILLS for any discharge medications will be authorized once you are discharged, as it is imperative that you return to your primary care physician (or establish a relationship with a primary care physician if you do not have one) for your aftercare needs so that they can reassess your need for medications and monitor your lab values.

## 2014-05-19 NOTE — Care Management Note (Signed)
[facility].  Resumption of care orders requested upon discharge.

## 2014-05-19 NOTE — Evaluation (Signed)
Physical Therapy Evaluation Patient Details Name: SHATYRA BECKA MRN: [id number] DOB: [birth year] Today's Date: 05/19/2014   History of Present Illness  Pt with past medical history of Parkinson's disease and atrial fibrillation (maintained on Coumadin), she is wheelchair-bound. She presented to the emergency department with palpitation on 12/21.  Workup also positive for UTI and probably dehydration.  Clinical Impression  Patient presents essentially at baseline for transfers.  Did need some assistance sit-to-stand and reports she occasionally needs assist at home and husband can provide.  Feel patient safe to return to previous care.  Recommend patient continue HHPT to continue to progress mobility and independence for functional gains.      Follow Up Recommendations Home health PT (was recieving HHPT prior to admission)    Equipment Recommendations  None recommended by PT    Recommendations for Other Services       Precautions / Restrictions Precautions Precautions: Fall Restrictions Weight Bearing Restrictions: No      Mobility  Bed Mobility Overal bed mobility: Needs Assistance Bed Mobility: Supine to Sit     Supine to sit: Min assist     General bed mobility comments: required assistance to move legs to EOB and to bring shoulders off bed.  Patient does not lie in bed at home.  Transfers Overall transfer level: Needs assistance Equipment used: None Transfers: Sit to/from [facility] Sit to Stand: Min assist Stand pivot transfers: Supervision       General transfer comment: patient required slight boost for sit-stand, supervision for safety for stand-pivot transfer.  Ambulation/Gait                Stairs            Wheelchair Mobility    Modified Rankin (Stroke Patients Only)       Balance Overall balance assessment: Needs assistance Sitting-balance support: No upper extremity supported;Feet supported Sitting balance-Leahy  Scale: Good     Standing balance support: Bilateral upper extremity supported Standing balance-Leahy Scale: Poor Standing balance comment: supports self on wheelchair at home during standing; today supported self on recliner.                             Pertinent Vitals/Pain Pain Assessment: No/denies pain    Home Living                        Prior Function                 Hand Dominance        Extremity/Trunk Assessment   Upper Extremity Assessment: Generalized weakness           Lower Extremity Assessment: Generalized weakness      Cervical / Trunk Assessment: Kyphotic  Communication      Cognition Arousal/Alertness: Awake/alert Behavior During Therapy: WFL for tasks assessed/performed Overall Cognitive Status: Within Functional Limits for tasks assessed                      General Comments      Exercises        Assessment/Plan    PT Assessment All further PT needs can be met in the next venue of care  PT Diagnosis Generalized weakness   PT Problem List Decreased strength;Decreased activity tolerance;Decreased balance;Decreased mobility  PT Treatment Interventions     PT Goals (Current goals can be found in the Care Plan  section) Acute Rehab PT Goals PT Goal Formulation: All assessment and education complete, DC therapy    Frequency     Barriers to discharge        Co-evaluation               End of Session Equipment Utilized During Treatment: Gait belt Activity Tolerance: Patient tolerated treatment well Patient left: in chair;with call bell/phone within reach Nurse Communication: Mobility status         Time: 0104-0459 PT Time Calculation (min) (ACUTE ONLY): 22 min   Charges:   PT Evaluation $Initial PT Evaluation Tier I: 1 Procedure PT Treatments $Therapeutic Activity: 8-22 mins   PT G CodesShanna Cisco 05/19/2014, 10:24 AM  05/19/2014 [place],  [place]

## 2014-05-19 NOTE — Discharge Summary (Signed)
Christy Pham, 68 y.o., DOB Jan 19, 1946, MRN [id number]. Admission date: 05/17/2014 Discharge Date 05/19/2014 Primary MD Pamelia Hoit, MD Admitting Physician Elease Etienne, MD  Admission Diagnosis  Hyperlipidemia [E78.5] Fatigue [R53.83] Lower urinary tract infection [N39.0] Chronic atrial fibrillation [I48.2] Essential hypertension [I10]  PCP please follow: -Check CBC, BMP during next visit - Follow on INR and adjust warfarin dose as needed, patient will reschedule her next INR nurse visit for this coming Monday.  Discharge Diagnosis   Principal Problem:   Atrial fibrillation with RVR Active Problems:   UTI (lower urinary tract infection)   Dehydration   Essential hypertension      Past Medical History  Diagnosis Date  . Parkinson's disease     dx circa 1990 by Dr. Thad Ranger  . Hyperlipidemia     dx circa 2008  . Hypertension     dx circa 2008  . Atrial fibrillation   . Osteoarthritis     "L knee primarily" (05/17/2014)  . Depression     Past Surgical History  Procedure Laterality Date  . Colonoscopy      2006 by Jeani Hawking (clear per pt)  . Tonsillectomy  ~ 1950  . Tubal ligation  ~ 1983   Admission history of present illness/brief narrative:  Christy Pham is a 68 y.o. female, with a past medical history of Parkinson's disease and atrial fibrillation (maintained on Coumadin), she is wheelchair-bound. She presents to the emergency department with palpitation. The patient reports that she ran out of metoprolol on December 20. She denies any other symptoms other than anorexia and a twinge after she finishes her urine stream. EMS reports pulse rates of 140-190 during transportation. In the ER the patient was found to have an INR of 1.71 (slightly sub therapeutic) and a digoxin level of .7 (also sub therapeutic). U/A appears positive for infection. EKG was showing Optifast secondary to ongoing parkinsonism tremors, on telemetry patient heart rate was not  elevated . Patient urine culture growing gram-negative rods, she checked was treated with 3 days of IV Rocephin during hospitalization, no further evidence of fever or leukocytosis.   Hospital Course See H&P, Labs, Consult and Test reports for all details in brief, patient was admitted for **  Principal Problem:   Atrial fibrillation with RVR Active Problems:   UTI (lower urinary tract infection)   Dehydration   Essential hypertension  A. fib with RVR - Patient ran out of metoprolol at home, this is most likely causing her A. fib with RVR during initial presentation versus Parkinson artifact, there is no further recurrence of RVR during hospitalization, on the monitor patient was noticed to have RVR, but it was clearly an artifact giving her tremors. - Appears better controlled after she was started on metoprolol.   Subtherapeutic INR Patient was bridged with Lovenox during hospitalization, INR is 1.79 at day of discharge, patient was instructed to check her INR next Monday via the visiting nurse.  Subtherapeutic digoxin level Restart digoxin at 0.25 daily  Urinary tract infection - Treated with total of 3 days of IV Rocephin, will discharge on 4 days of oral levofloxacin to finish total of 7 days. -Urine culture growing gram-negative rods  Parkinson's -Per patient is at baseline. -Continue carbidopa-levodopa-entacapone, and amantadine. -PT consulted  Hypertension -Continue metoprolol  HLD -Continue simvastatin    Significant Tests:  See full reports for all details    Dg Chest 2 View  05/17/2014   CLINICAL DATA:  Fatigue.  Weakness.  EXAM: CHEST  2 VIEW  COMPARISON:  08/13/2011  FINDINGS: Tortuous and atherosclerotic thoracic aorta. Upper normal heart size, cardiothoracic index 56% on the AP frontal projection. Degenerative glenohumeral arthropathy. The lungs appear clear.  IMPRESSION: 1. No active cardiopulmonary disease. There is some underlying atherosclerosis of  the thoracic aorta along with thoracic spondylosis. Upper normal heart size.   Electronically Signed   By: Herbie BaltimoreWalt  Liebkemann M.D.   On: 05/17/2014 10:50     Today   Subjective:   Christy HuffCarol Pham today has no headache,no chest abdominal pain,no new weakness tingling or numbness, feels much better today.  Objective:   Blood pressure 138/105, pulse 91, temperature 97.6 F (36.4 C), temperature source Oral, resp. rate 19, height 5\' 4"  (1.626 m), weight 97.705 kg (215 lb 6.4 oz), SpO2 99 %.  Intake/Output Summary (Last 24 hours) at 05/19/14 1328 Last data filed at 05/19/14 0700  Gross per 24 hour  Intake 2652.08 ml  Output      1 ml  Net 2651.08 ml    Exam Awake Alert, Oriented *3, No new F.N deficits, Normal affect [place].AT,PERRAL Supple Neck,No JVD, No cervical lymphadenopathy appriciated.  Symmetrical Chest wall movement, Good air movement bilaterally, CTAB ,No Gallops,Rubs or new Murmurs, No Parasternal Heave +ve B.Sounds, Abd Soft, Non tender, No organomegaly appriciated, No rebound -guarding or rigidity. No Cyanosis, Clubbing or edema, No new Rash or bruise  Data Review     CBC w Diff: Lab Results  Component Value Date   WBC 7.6 05/19/2014   HGB 12.9 05/19/2014   HCT 40.0 05/19/2014   PLT 242 05/19/2014   LYMPHOPCT 14 05/17/2014   MONOPCT 7 05/17/2014   EOSPCT 1 05/17/2014   BASOPCT 0 05/17/2014   CMP: Lab Results  Component Value Date   NA 137 05/19/2014   K 3.7 05/19/2014   CL 103 05/19/2014   CO2 28 05/19/2014   BUN 9 05/19/2014   CREATININE 0.69 05/19/2014   CREATININE 0.65 12/11/2013   PROT 7.0 05/17/2014   ALBUMIN 3.5 05/17/2014   BILITOT 0.7 05/17/2014   ALKPHOS 78 05/17/2014   AST 20 05/17/2014   ALT 21 05/17/2014  .  Micro Results Recent Results (from the past 240 hour(s))  Urine culture     Status: None (Preliminary result)   Collection Time: 05/17/14 12:21 PM  Result Value Ref Range Status   Specimen Description URINE, RANDOM  Final    Special Requests NONE  Final   Culture  Setup Time   Final    05/18/2014 04:20 Performed at [facility]    Colony Count   Final    >=100,000 COLONIES/ML Performed at [facility]    Culture   Final    GRAM NEGATIVE RODS Performed at [facility]    Report Status PENDING  Incomplete     Discharge Instructions      Follow-up Information    Follow up with Pamelia HoitWILSON,FRED HENRY, MD. Schedule an appointment as soon as possible for a visit in 1 week.   Specialty:  Family Medicine   Contact information:   [address] [place] [place] [address] [phone]       Discharge Medications     Medication List    TAKE these medications        amantadine 100 MG capsule  Commonly known as:  SYMMETREL  Take 1 capsule (100 mg total) by mouth 3 (three) times daily.     carbidopa-levodopa-entacapone 25-100-200 MG per tablet  Commonly known  as:  STALEVO  Take 1 tablet by mouth 4 (four) times daily.     digoxin 0.25 MG tablet  Commonly known as:  LANOXIN  Take 250 mcg by mouth daily.     FLUoxetine 40 MG capsule  Commonly known as:  PROZAC  Take 40 mg by mouth daily.     levofloxacin 500 MG tablet  Commonly known as:  LEVAQUIN  Take 1 tablet (500 mg total) by mouth daily.  Start taking on:  05/20/2014     metoprolol tartrate 25 MG tablet  Commonly known as:  LOPRESSOR  Take 12.5 mg by mouth 2 (two) times daily.     multivitamin tablet  Take 1 tablet by mouth daily.     simvastatin 40 MG tablet  Commonly known as:  ZOCOR  Take 40 mg by mouth every evening.     warfarin 5 MG tablet  Commonly known as:  COUMADIN  Take 1 tablet (5 mg total) by mouth daily. Take 1 (5 mg) tablet by mouth daily. Follow with your PCP regarding dosing and INR monitoring.         Total Time in preparing paper work, data evaluation and todays exam - 35 minutes  Toniyah Dilmore M.D on 05/19/2014 at 1:28 PM  [facility] Office  [phone]

## 2014-05-20 LAB — URINE CULTURE: Colony Count: >=100000

## 2014-06-25 ENCOUNTER — Ambulatory Visit (INDEPENDENT_AMBULATORY_CARE_PROVIDER_SITE_OTHER): Payer: Medicare Other | Admitting: Cardiology

## 2014-06-25 ENCOUNTER — Encounter: Payer: Self-pay | Admitting: Cardiology

## 2014-06-25 VITALS — BP 116/80 | HR 63 | Ht 64.0 in

## 2014-06-25 DIAGNOSIS — I481 Persistent atrial fibrillation: Secondary | ICD-10-CM

## 2014-06-25 DIAGNOSIS — I340 Nonrheumatic mitral (valve) insufficiency: Secondary | ICD-10-CM

## 2014-06-25 DIAGNOSIS — I4819 Other persistent atrial fibrillation: Secondary | ICD-10-CM

## 2014-06-25 NOTE — Progress Notes (Signed)
Cardiology Office Note   Date:  06/25/2014   ID:  Christy Pham, DOB 11-01-1945, MRN [id number]  PCP:  Pamelia Hoit, MD  Cardiologist:   Cassell Clement, MD   No chief complaint on file.     History of Present Illness: Christy Pham is a 69 y.o. female who presents for cardiology evaluation regarding her atrial fibrillation.  She is a medical patient of Dr. Benedetto Goad.  She states that she has had atrial fibrillation for several years.  He has been on long-term Coumadin.  She has not had any TIA or stroke symptoms.  She is physically inactive.  She has had Parkinson's disease for almost 20 years.  Essentially she gets around in a motorized wheelchair.  She has help at home.  She has not been experiencing any chest pain or symptoms of CHF.  Her last echocardiogram was in March 2013 and showed an ejection fraction of 50-55% with mild to moderate mitral regurgitation.    Past Medical History  Diagnosis Date  . Parkinson's disease     dx circa 1990 by Dr. Thad Ranger  . Hyperlipidemia     dx circa 2008  . Hypertension     dx circa 2008  . Atrial fibrillation   . Osteoarthritis     "L knee primarily" (05/17/2014)  . Depression     Past Surgical History  Procedure Laterality Date  . Colonoscopy      2006 by Jeani Hawking (clear per pt)  . Tonsillectomy  ~ 1950  . Tubal ligation  ~ 1983     Current Outpatient Prescriptions  Medication Sig Dispense Refill  . ALPRAZolam (XANAX) 0.25 MG tablet Take 0.25 mg by mouth daily. FOR ANXIETY  5  . amantadine (SYMMETREL) 100 MG capsule Take 1 capsule (100 mg total) by mouth 3 (three) times daily. 90 capsule 5  . carbidopa-levodopa-entacapone (STALEVO) 25-100-200 MG per tablet Take 1 tablet by mouth 4 (four) times daily. 360 tablet 1  . digoxin (LANOXIN) 0.25 MG tablet Take 250 mcg by mouth daily.    Marland Kitchen FLUoxetine (PROZAC) 40 MG capsule Take 40 mg by mouth daily.    . metoprolol tartrate (LOPRESSOR) 25 MG tablet Take 0.5 tablets  (12.5 mg total) by mouth 2 (two) times daily. 30 tablet 0  . Multiple Vitamin (MULTIVITAMIN) tablet Take 1 tablet by mouth daily.    . simvastatin (ZOCOR) 40 MG tablet Take 40 mg by mouth every evening.    . warfarin (COUMADIN) 5 MG tablet Take 1 tablet (5 mg total) by mouth daily. Take 1 (5 mg) tablet by mouth daily. Follow with your PCP regarding dosing and INR monitoring.     No current facility-administered medications for this visit.    Allergies:   Review of patient's allergies indicates no known allergies.    Social History:  The patient  reports that she has never smoked. She has never used smokeless tobacco. She reports that she drinks about 1.8 oz of alcohol per week. She reports that she does not use illicit drugs.   Family History:  The patient's family history includes Colon cancer (age of onset: 71) in her father; Coronary artery disease (age of onset: 22) in her father; Dementia in her mother; Restless legs syndrome in her father.    ROS:  Please see the history of present illness.   Otherwise, review of systems are positive for none.   All other systems are reviewed and negative.    PHYSICAL EXAM: VS:  BP 116/80 mmHg  Pulse 63  Ht  (1.626 m)  Wt   SpO2 97% , BMI Body mass index is 0.00 kg/(m^2). GEN: Well nourished, well developed, in no acute distress HEENT: normal Neck: no JVD, carotid bruits, or masses Cardiac: Pulse is irregularly irregular.  There is no murmur rubs, or gallops,no edema  Respiratory:  clear to auscultation bilaterally, normal work of breathing GI: soft, nontender, nondistended, + BS MS: no deformity or atrophy Skin: warm and dry, no rash Neuro:  Strength and sensation are intact Psych: euthymic mood, full affect   EKG:  EKG is not ordered today.  EKG from 05/18/14 was reviewed and shows atrial fibrillation with controlled ventricular response.    Recent Labs: 12/11/2013: TSH 1.846 05/17/2014: ALT 21 05/19/2014: BUN 9; Creatinine  0.69; Hemoglobin 12.9; Platelets 242; Potassium 3.7; Sodium 137    Lipid Panel    Component Value Date/Time   CHOL  12/03/2009 0515    156        ATP III CLASSIFICATION:  <200     mg/dL   Desirable  161-096  mg/dL   Borderline High  >=045    mg/dL   High          TRIG 79 12/03/2009 0515   HDL 57 12/03/2009 0515   CHOLHDL 2.7 12/03/2009 0515   VLDL 16 12/03/2009 0515   LDLCALC  12/03/2009 0515    83        Total Cholesterol/HDL:CHD Risk Coronary Heart Disease Risk Table                     Men   Women  1/2 Average Risk   3.4   3.3  Average Risk       5.0   4.4  2 X Average Risk   9.6   7.1  3 X Average Risk  23.4   11.0        Use the calculated Patient Ratio above and the CHD Risk Table to determine the patient's CHD Risk.        ATP III CLASSIFICATION (LDL):  <100     mg/dL   Optimal  409-811  mg/dL   Near or Above                    Optimal  130-159  mg/dL   Borderline  914-782  mg/dL   High  >956     mg/dL   Very High      Wt Readings from Last 3 Encounters:  05/18/14 215 lb 6.4 oz (97.705 kg)  08/16/11 188 lb 4.4 oz (85.4 kg)  02/02/10 240 lb (108.863 kg)      Other studies Reviewed: Additional studies/ records that were reviewed today include: . Review of the above records demonstrates:    ASSESSMENT AND PLAN:  1.  Chronic established atrial fibrillation with controlled ventricular response.  She is not having any active symptoms referable to her atrial fibrillation at this time.  She is on long-term Coumadin and this is monitored through Dr. Tawana Scale office.  The nurse from [facility] comes to her home and obtains her INRs.  Her Chaddsvasc score is at least 2 for age and female gender. 2.  Past history of mild to moderate mitral regurgitation by prior echo.   Current medicines are reviewed at length with the patient today.  The patient does not have concerns regarding medicines.  The following changes have been made:  no  change  Labs/ tests ordered  today include:  Orders Placed This Encounter  Procedures  . EKG 12-Lead  . 2D Echocardiogram without contrast     Disposition:  The patient will continue on her current medication.  We will update her two-dimensional echo.  She will return in 6 months for a follow-up office visit and EKG. many thanks for the opportunity to see this pleasant woman with you.  We will be in touch regarding the results of her echocardiogram.   Signed, Cassell Clementhomas Marshel Golubski, MD  06/25/2014 2:05 PM    [facility] [address], [place], [place]  [address] Phone: [phone]; Fax: [phone]

## 2014-06-25 NOTE — Patient Instructions (Signed)
Your physician has requested that you have an echocardiogram. Echocardiography is a painless test that uses sound waves to create images of your heart. It provides your doctor with information about the size and shape of your heart and how well your heart's chambers and valves are working. This procedure takes approximately one hour. There are no restrictions for this procedure.  Your physician wants you to follow-up in: 6 month ov/ekg You will receive a reminder letter in the mail two months in advance. If you don't receive a letter, please call our office to schedule the follow-up appointment.   Your physician recommends that you continue on your current medications as directed. Please refer to the Current Medication list given to you today.  

## 2014-07-02 ENCOUNTER — Other Ambulatory Visit (HOSPITAL_COMMUNITY): Payer: Medicare Other

## 2014-07-04 ENCOUNTER — Other Ambulatory Visit: Payer: Self-pay | Admitting: Neurology

## 2014-07-05 NOTE — Telephone Encounter (Signed)
Stalevo refill requested. Per last office note- patient to remain on medication. Refill approved and sent to patient's pharmacy.   

## 2014-07-09 ENCOUNTER — Ambulatory Visit (HOSPITAL_COMMUNITY): Payer: Medicare Other | Attending: Cardiology | Admitting: Cardiology

## 2014-07-09 DIAGNOSIS — I481 Persistent atrial fibrillation: Secondary | ICD-10-CM | POA: Diagnosis present

## 2014-07-09 DIAGNOSIS — I4819 Other persistent atrial fibrillation: Secondary | ICD-10-CM

## 2014-07-09 DIAGNOSIS — I340 Nonrheumatic mitral (valve) insufficiency: Secondary | ICD-10-CM | POA: Insufficient documentation

## 2014-07-09 NOTE — Progress Notes (Signed)
Echo performed. 

## 2014-07-14 ENCOUNTER — Telehealth: Payer: Self-pay | Admitting: *Deleted

## 2014-07-14 NOTE — Telephone Encounter (Signed)
Advised patient and will send to Dr Andrey CampanileWilson

## 2014-07-14 NOTE — Telephone Encounter (Signed)
-----   Message from Cassell Clementhomas Brackbill, MD sent at 07/09/2014  6:41 PM EST ----- Please report.  The echo was normal. EF normal 55-65%. Continue same meds. Send copy to Dr. Benedetto GoadFred Wilson.

## 2014-08-16 ENCOUNTER — Ambulatory Visit: Payer: Medicare Other | Admitting: Neurology

## 2014-09-17 ENCOUNTER — Encounter: Payer: Self-pay | Admitting: Neurology

## 2014-09-17 ENCOUNTER — Ambulatory Visit (INDEPENDENT_AMBULATORY_CARE_PROVIDER_SITE_OTHER): Payer: Medicare Other | Admitting: Neurology

## 2014-09-17 VITALS — BP 150/84 | HR 64

## 2014-09-17 DIAGNOSIS — G20 Parkinson's disease: Secondary | ICD-10-CM

## 2014-09-17 DIAGNOSIS — R251 Tremor, unspecified: Secondary | ICD-10-CM

## 2014-09-17 DIAGNOSIS — F329 Major depressive disorder, single episode, unspecified: Secondary | ICD-10-CM

## 2014-09-17 DIAGNOSIS — F32A Depression, unspecified: Secondary | ICD-10-CM

## 2014-09-17 MED ORDER — CARBIDOPA-LEVODOPA 25-100 MG PO TABS: 1.0000 | ORAL_TABLET | Freq: Two times a day (BID) | ORAL | Status: DC

## 2014-09-17 MED ORDER — CARBIDOPA-LEVODOPA-ENTACAPONE 31.25-125-200 MG PO TABS: 1.0000 | ORAL_TABLET | Freq: Four times a day (QID) | ORAL | Status: DC

## 2014-09-17 NOTE — Progress Notes (Signed)
Note routed to Dr Wilson.  

## 2014-09-17 NOTE — Patient Instructions (Signed)
Increase Stalevo 125 mg - 1 tablet at 7 am/ 11 am/ 3 pm/ 7 pm Add Carbidopa Levodopa 25/100 - 1 tablet at 9 am/ 1 pm

## 2014-09-17 NOTE — Progress Notes (Signed)
Christy Pham was seen today in the movement disorders clinic for neurologic follow up regarding parkinsonism.     This patient is accompanied in the office by her spouse  Christy Stable(Bill) who supplements the history.    The last note that I have from [name] was May, 2013.  The patient was [name] prior to Dr. Anne HahnWillis.  She has had symptoms ever since 2001.  The patient reports first sx was tremor in the R hand that was intermittent.  She is R hand dominant.  She now rarely has tremor in the L hand and has had that for about 2 years.  She believes that her first drug was requip.  She cannot remember the year and is unsure if the requip was or is helpful.  Several years later, she started on levodopa.  It was beneficial but not dramatic.  She was changed to stalevo in May 2013.  She is unsure if the change was helpful.    Currently, the patient is on Stalevo 100 mg 3 times a day.  She is on Requip 3 mg 3 times a day.  She was previously on Artane.  11/05/12  Last visit, I decreased her Requip to 2 mg 3 times a day because of hallucinations and LE edema.  She has had less hallucinations but states that she "sort of misses the people."   She states that the "people in the backyard have left."  She still has some visual hallucinations, but most of that is seeing faces in trees.   She is on Stalevo 100 tid.  She was to change the timing of her stalevo last visit but she is still taking it at 9:30am, 4:30pm and 9:30 pm and she awakens at 6:30 am and goes to bed at 10pm. She is still doing home PT and is working on transferring from the hover round to the manual wheelchair.  She doesn't want to go to the neurorehab center because she is emotionally attached to her therapists.  Overall the pt and her husband agree that she is doing better  02/11/13 update:  Last visit, I decreased the patient's Requip because of hallucinations.  She no longer has "visitors" in the backyard and "I kind of miss  those." Her Stalevo was increased to 100 mg 4 times per day.  Her husband feels that her medication is "erratic" and sometimes he notices when it helps and other times he is not so sure. I referred her to [name] last visit, and she reports that she has been to see him several times.  She is not sure if this helped, but her husband states that the patient "likes talking to him."  There is still significant difficulty with transfers.  She had 1 fall similar to her previous one where she slipped down between a chair.  This is the only fall that she is, but she does not walk much.  She relies completely on her husband to help transfer.  This has become somewhat more difficult.  She did get a scooter and he modified that to make it more comfortable, but she still has difficulty getting on and off.  02/17/13 update:  Pt presents today for on/off testing.  She has been off all med (was told to d/c levodopa but remain on other meds) for over 24 hours.  She feels more stiff and shaky since being off of the medication.  No falls.  Does not wish to consider  going to more intensive rehabilitation therapy at least until January.  She has had significant difficulty transferring.  No hallucinations.  06/22/13 update:  Pt presents today for f/u, accompanied by her husband who supplements the hx.  The pt states that she has generally been better.  Her mind has been much more clear.  The patient still states that she misses the people she is to see in the hallucinations.  She still does have some hallucinations, but her husband states that there nothing like they used to be.  She still has the physical therapist coming on.  Her husband she is much easier transfer, but she still cannot walk.  She does state that the tremor can be very bothersome.  She wonders if going up on the levodopa can help.  She is currently on Stalevo at 7 AM/11 AM/3 PM/7 PM and carbidopa/levodopa 25/100 at 9 AM and 1 PM.  She remains on ropirinole 2  mg, half tablet 3 times per day.  08/10/13 update:  Pt presents today for f/u, accompanied by her husband who supplements the history.  She is currently on Stalevo at 7 AM/11 AM/3 PM/7 PM and carbidopa/levodopa 25/100 at 9 AM and 1 PM.  Requip has been discontinued since last visit.  Her cognition is improved.  Hallucinations are resolved. Last visit, amantadine 100 mg tid was added.  Tremor is overall better but she has good and bad days.  She has refused both aqua therapy and in patient rehab, which I think would benefit her greatly.  She has refused physiatry consultation.  She is still having depression and is working this out with [name].  He told her that she needs to find a hobby.  She has no SI/HI.    12/11/13 update:  Pt presents today for f/u, accompanied by her husband who supplements the history.  She is currently on Stalevo at 7 AM/11 AM/3 PM/7 PM and carbidopa/levodopa 25/100 at 9 AM and 1 PM. She is also on amantadine 100 tid.  Since our last visit, I did have the opportunity to talk to one of her physical therapist that works with her at home.  Her physical therapist provided the history that there are times that she arrived at the home and the patient is drunk or has drank too much alcohol.  Feels that she does that to control depression.  Pts husband states that she had some cognitive issues last week but pt is insistent that she must have been falling asleep and was dreaming when he was talking to her.  He doesn't say anything further when she begins to disagree about cognitive changes.  Was to get a WC mobility eval at the neurorehab center; they state that the rehab center didn't call them but when the rehab center was contacted while they were in the office, the rehab center stated that they did contact the patient but they couldn't be reached.  Pt hasn't f/u with PCP since Jan  01/04/14 update:  Pt seen today, much sooner than anticipated.  Accompanied by husband who supplements the  history.   She is currently on Stalevo at 7 AM/11 AM/3 PM/7 PM and carbidopa/levodopa 25/100 at 9 AM and 1 PM. She is also on amantadine 100 tid.  She presents today for a mobility evaluation.  Currently, the patient uses a scooter, but the central drive shaft is in the way, and she cannot get her feet around it.  Her husband has to literally push her foot  around it, but the rigidity is a significant that he feels that he is going to cause damage to her foot when he does it.  He has had to modify her scooter because her feet do not otherwise lay on a normal scooter properly.  The patient cannot ambulate at all on her own with any type of ambulatory assistive device.  She cannot operate a manual wheelchair, as her arms are too weak and too tremulous.  She has a history of atrial fibrillation as well, which limits her ability from heart standpoint to maneuver around.  She does have the physical and mental ability to operate a power wheelchair safely.  Her home has been measured, and all doorways are able to accommodate a power wheelchair, with the exception of one bathroom, but she has other bathrooms in the home to accommodate it just fine.  04/16/14 update:  Patient is following up today, accompanied by her husband and a new caregiver who supplements the history.  Patient remains on Stalevo 100 at 7 AM/11 AM/3 PM/7 PM and an additional carbidopa/levodopa 25/100 at 9 AM and 1 PM.  She is also on amantadine 100 mg 3 times per day.  Despite the fact that we have been working for almost a year on trying to get her a power wheelchair, she still does not have it.  She is frustrated by this.  She does have a new caregiver and is doing more with her but cannot get out of the home much.  Awaiting on both the power WC and a new van.  No falls.  No hallucinations.  Pt decreased alcohol to 3 glasses wine per week.  09/17/14 update:  Pt is following up today, accompanied by her caregiver who supplements the history.   Patient  remains on Stalevo 100 at 7 AM/11 AM/3 PM/7 PM and is supposed to be on an additional carbidopa/levodopa 25/100 at 9 AM and 1 PM but she doesn't think that she is on that and doesn't know when/how that got dropped.  It was last RX in July and that was a 6 month supply.  She is also on amantadine 100 mg 3 times per day.   Tremor has definitely increased since last visit.  She finally was able to get her power WC.  The records that were made available to me were reviewed.  She was admitted to the hospital in Dec for UTI and a-fib with RVR, after she ran out of metoprolol.   She is still doing PT 2 days a week.  Her caregiver is there 6 days a week.  She is getting out more with her and with her conversion van.  She is therefore less depressed.     2004 MRI report: CLINICAL DATA: TREMORS ON RIGHT SIDE.  MRI OF THE BRAIN WITHOUT AND WITH CONTRAST  NO COMPARISON.  THERE IS MILD BRAIN ATROPHY. THERE IS A SMALL HYPERINTENSITY IN THE POSTERIOR LIMB OF THE INTERNAL  CAPSULE ON THE LEFT WHICH APPEARS TO BE AN OLD INFARCT. THE REMAINDER OF THE WHITE MATTER APPEARS  NORMAL. THE BRAINSTEM APPEARS NORMAL. THERE IS NO MASS OR ACUTE INFARCT. THERE IS NO PATHOLOGIC  ENHANCEMENT.  IMPRESSION  NO ACUTE ABNORMALITY.   PREVIOUS MEDICATIONS: Sinemet, Requip and artane  ALLERGIES:  No Known Allergies  CURRENT MEDICATIONS:  Current Outpatient Prescriptions on File Prior to Visit  Medication Sig Dispense Refill  . ALPRAZolam (XANAX) 0.25 MG tablet Take 0.25 mg by mouth daily. FOR ANXIETY  5  .  amantadine (SYMMETREL) 100 MG capsule Take 1 capsule (100 mg total) by mouth 3 (three) times daily. 90 capsule 5  . digoxin (LANOXIN) 0.25 MG tablet Take 250 mcg by mouth daily.    Marland Kitchen FLUoxetine (PROZAC) 40 MG capsule Take 40 mg by mouth daily.    . metoprolol tartrate (LOPRESSOR) 25 MG tablet Take 0.5 tablets (12.5 mg total) by mouth 2 (two) times daily. 30 tablet 0  . Multiple Vitamin (MULTIVITAMIN) tablet Take 1 tablet by  mouth daily.    . simvastatin (ZOCOR) 40 MG tablet Take 40 mg by mouth every evening.    . warfarin (COUMADIN) 5 MG tablet Take 1 tablet (5 mg total) by mouth daily. Take 1 (5 mg) tablet by mouth daily. Follow with your PCP regarding dosing and INR monitoring.     No current facility-administered medications on file prior to visit.    PAST MEDICAL HISTORY:   Past Medical History  Diagnosis Date  . Parkinson's disease     dx circa 1990 by Dr. Thad Ranger  . Hyperlipidemia     dx circa 2008  . Hypertension     dx circa 2008  . Atrial fibrillation   . Osteoarthritis     "L knee primarily" (05/17/2014)  . Depression     PAST SURGICAL HISTORY:   Past Surgical History  Procedure Laterality Date  . Colonoscopy      2006 by Jeani Hawking (clear per pt)  . Tonsillectomy  ~ 1950  . Tubal ligation  ~ 1983    SOCIAL HISTORY:   History   Social History  . Marital Status: Married    Spouse Name: N/A  . Number of Children: 1  . Years of Education: 16   Occupational History  . 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   Social History Main Topics  . Smoking status: Never Smoker   . Smokeless tobacco: Never Used  . Alcohol Use: 1.8 oz/week    0 Standard drinks or equivalent, 3 Glasses of wine per week  . Drug Use: No  . Sexual Activity: Not Currently   Other Topics Concern  . Not on file   Social History Narrative    FAMILY HISTORY:   Family Status  Relation Status Death Age  . Father Deceased 70    AAA  . Mother Deceased 76    "old age"  . Sister Alive 74    healthy  . Child Alive     1 son, healthy    ROS:  A complete 10 system review of systems was obtained and was unremarkable apart from what is mentioned above.  PHYSICAL EXAMINATION:    VITALS:   Filed Vitals:   09/17/14 1022  BP: 150/84  Pulse: 64    GEN:  The patient appears stated age and is in NAD. HEENT:  Normocephalic, atraumatic.  The  mucous membranes are moist. The superficial temporal arteries are without ropiness or tenderness. CV:  Irregular. Lungs:  CTAB Neck/HEME:  There are no carotid bruits bilaterally. Exts:  There is LE edema  Neurological examination:  Orientation: A MoCA was performed on 12/11/13 and the patient scored 26/30.  The patient is alert and oriented x3 today.  Cranial nerves: There is good facial symmetry. The visual fields are full to confrontational testing. The speech is fluent and clear. Soft palate rises symmetrically and there is no tongue deviation. Hearing is intact to conversational tone. Sensation: Sensation  is intact to light touch throughout Motor: Strength is 5/5 in the bilateral upper and lower extremities.   Shoulder shrug is equal and symmetric.  There is no pronator drift.   Movement examination: Tone: Normal tone in the bilateral upper extremities today.  Abnormal movements: There is near constant independent tremor in the right and left upper extremity and more mild in the left lower extremity. Coordination:  There is mild to mod decremation with RAM's, seen bilaterally but much more pronounced in the left hemibody, Including alternating supination and pronation of the forearm, hand opening and closing, finger taps, heel taps and toe taps. Gait and Station: strapped in her wheelchair.  Doesn't walk much any longer.    ASSESSMENT/PLAN:  1.  Parkinsonism.  -It is certainly possible that this represents idiopathic Parkinson's disease, but I am also considering the possibility of MSA.    -increase her Stalevo to 125 mg at 7 AM/11 AM/3 PM/7pm, and restart carbidopa/levodopa 25/100 at 9 AM and 1 PM which she dropped somewhere along the way.    -requip has been d/c due to hallucinations and cognitive change.    -  I. think she would benefit greatly from inpatient rehabilitation but has refused  -She will continue on amantadine 100 mg tid. 2.  Excess EtOH use  -  She has greatly  decreased her alcohol intake to 3 times per week. 3.  Depression.  -She is doing some better with her new caregiver. 4.  F/u in the next few months, sooner should new neurologic issues arise.

## 2015-01-21 ENCOUNTER — Ambulatory Visit: Payer: Medicare Other | Admitting: Neurology

## 2015-01-25 ENCOUNTER — Ambulatory Visit: Payer: Medicare Other | Admitting: Neurology

## 2015-02-04 ENCOUNTER — Ambulatory Visit (INDEPENDENT_AMBULATORY_CARE_PROVIDER_SITE_OTHER): Payer: Medicare Other | Admitting: Neurology

## 2015-02-04 ENCOUNTER — Encounter: Payer: Self-pay | Admitting: Neurology

## 2015-02-04 VITALS — BP 128/70 | HR 55

## 2015-02-04 DIAGNOSIS — F32A Depression, unspecified: Secondary | ICD-10-CM

## 2015-02-04 DIAGNOSIS — F329 Major depressive disorder, single episode, unspecified: Secondary | ICD-10-CM | POA: Diagnosis not present

## 2015-02-04 DIAGNOSIS — G20 Parkinson's disease: Secondary | ICD-10-CM

## 2015-02-04 NOTE — Progress Notes (Signed)
Christy Pham was seen today in the movement disorders clinic for neurologic follow up regarding parkinsonism.     This patient is accompanied in the office by her spouse  Christy Stable(Bill) who supplements the history.    The last note that I have from Dr Christy Pham was May, 2013.  The patient was seen by [name] at [facility] prior to Dr. Anne Pham.  She has had symptoms ever since 2001.  The patient reports first sx was tremor in the R hand that was intermittent.  She is R hand dominant.  She now rarely has tremor in the L hand and has had that for about 2 years.  She believes that her first drug was requip.  She cannot remember the year and is unsure if the requip was or is helpful.  Several years later, she started on levodopa.  It was beneficial but not dramatic.  She was changed to stalevo in May 2013.  She is unsure if the change was helpful.    Currently, the patient is on Stalevo 100 mg 3 times a day.  She is on Requip 3 mg 3 times a day.  She was previously on Artane.  11/05/12  Last visit, I decreased her Requip to 2 mg 3 times a day because of hallucinations and LE edema.  She has had less hallucinations but states that she "sort of misses the people."   She states that the "people in the backyard have left."  She still has some visual hallucinations, but most of that is seeing faces in trees.   She is on Stalevo 100 tid.  She was to change the timing of her stalevo last visit but she is still taking it at 9:30am, 4:30pm and 9:30 pm and she awakens at 6:30 am and goes to bed at 10pm. She is still doing home PT and is working on transferring from the hover round to the manual wheelchair.  She doesn't want to go to the neurorehab center because she is emotionally attached to her therapists.  Overall the pt and her husband agree that she is doing better  02/11/13 update:  Last visit, I decreased the patient's Requip because of hallucinations.  She no longer has "visitors" in the backyard and "I kind of miss  those." Her Stalevo was increased to 100 mg 4 times per day.  Her husband feels that her medication is "erratic" and sometimes he notices when it helps and other times he is not so sure. I referred her to Dr. Dellia Pham last visit, and she reports that she has been to see him several times.  She is not sure if this helped, but her husband states that the patient "likes talking to him."  There is still significant difficulty with transfers.  She had 1 fall similar to her previous one where she slipped down between a chair.  This is the only fall that she is, but she does not walk much.  She relies completely on her husband to help transfer.  This has become somewhat more difficult.  She did get a scooter and he modified that to make it more comfortable, but she still has difficulty getting on and off.  02/17/13 update:  Pt presents today for on/off testing.  She has been off all med (was told to d/c levodopa but remain on other meds) for over 24 hours.  She feels more stiff and shaky since being off of the medication.  No falls.  Does not wish to consider  going to more intensive rehabilitation therapy at least until January.  She has had significant difficulty transferring.  No hallucinations.  06/22/13 update:  Pt presents today for f/u, accompanied by her husband who supplements the hx.  The pt states that she has generally been better.  Her mind has been much more clear.  The patient still states that she misses the people she is to see in the hallucinations.  She still does have some hallucinations, but her husband states that there nothing like they used to be.  She still has the physical therapist coming on.  Her husband she is much easier transfer, but she still cannot walk.  She does state that the tremor can be very bothersome.  She wonders if going up on the levodopa can help.  She is currently on Stalevo at 7 AM/11 AM/3 PM/7 PM and carbidopa/levodopa 25/100 at 9 AM and 1 PM.  She remains on ropirinole 2  mg, half tablet 3 times per day.  08/10/13 update:  Pt presents today for f/u, accompanied by her husband who supplements the history.  She is currently on Stalevo at 7 AM/11 AM/3 PM/7 PM and carbidopa/levodopa 25/100 at 9 AM and 1 PM.  Requip has been discontinued since last visit.  Her cognition is improved.  Hallucinations are resolved. Last visit, amantadine 100 mg tid was added.  Tremor is overall better but she has good and bad days.  She has refused both aqua therapy and in patient rehab, which I think would benefit her greatly.  She has refused physiatry consultation.  She is still having depression and is working this out with Dr. Dellia Pham.  He told her that she needs to find a hobby.  She has no SI/HI.    12/11/13 update:  Pt presents today for f/u, accompanied by her husband who supplements the history.  She is currently on Stalevo at 7 AM/11 AM/3 PM/7 PM and carbidopa/levodopa 25/100 at 9 AM and 1 PM. She is also on amantadine 100 tid.  Since our last visit, I did have the opportunity to talk to one of her physical therapist that works with her at home.  Her physical therapist provided the history that there are times that she arrived at the home and the patient is drunk or has drank too much alcohol.  Feels that she does that to control depression.  Pts husband states that she had some cognitive issues last week but pt is insistent that she must have been falling asleep and was dreaming when he was talking to her.  He doesn't say anything further when she begins to disagree about cognitive changes.  Was to get a WC mobility eval at the neurorehab center; they state that the rehab center didn't call them but when the rehab center was contacted while they were in the office, the rehab center stated that they did contact the patient but they couldn't be reached.  Pt hasn't f/u with PCP since Jan  01/04/14 update:  Pt seen today, much sooner than anticipated.  Accompanied by husband who supplements the  history.   She is currently on Stalevo at 7 AM/11 AM/3 PM/7 PM and carbidopa/levodopa 25/100 at 9 AM and 1 PM. She is also on amantadine 100 tid.  She presents today for a mobility evaluation.  Currently, the patient uses a scooter, but the central drive shaft is in the way, and she cannot get her feet around it.  Her husband has to literally push her foot  around it, but the rigidity is a significant that he feels that he is going to cause damage to her foot when he does it.  He has had to modify her scooter because her feet do not otherwise lay on a normal scooter properly.  The patient cannot ambulate at all on her own with any type of ambulatory assistive device.  She cannot operate a manual wheelchair, as her arms are too weak and too tremulous.  She has a history of atrial fibrillation as well, which limits her ability from heart standpoint to maneuver around.  She does have the physical and mental ability to operate a power wheelchair safely.  Her home has been measured, and all doorways are able to accommodate a power wheelchair, with the exception of one bathroom, but she has other bathrooms in the home to accommodate it just fine.  04/16/14 update:  Patient is following up today, accompanied by her husband and a new caregiver who supplements the history.  Patient remains on Stalevo 100 at 7 AM/11 AM/3 PM/7 PM and an additional carbidopa/levodopa 25/100 at 9 AM and 1 PM.  She is also on amantadine 100 mg 3 times per day.  Despite the fact that we have been working for almost a year on trying to get her a power wheelchair, she still does not have it.  She is frustrated by this.  She does have a new caregiver and is doing more with her but cannot get out of the home much.  Awaiting on both the power WC and a new van.  No falls.  No hallucinations.  Pt decreased alcohol to 3 glasses wine per week.  09/17/14 update:  Pt is following up today, accompanied by her caregiver who supplements the history.   Patient  remains on Stalevo 100 at 7 AM/11 AM/3 PM/7 PM and is supposed to be on an additional carbidopa/levodopa 25/100 at 9 AM and 1 PM but she doesn't think that she is on that and doesn't know when/how that got dropped.  It was last RX in July and that was a 6 month supply.  She is also on amantadine 100 mg 3 times per day.   Tremor has definitely increased since last visit.  She finally was able to get her power WC.  The records that were made available to me were reviewed.  She was admitted to the hospital in Dec for UTI and a-fib with RVR, after she ran out of metoprolol.   She is still doing PT 2 days a week.  Her caregiver is there 6 days a week.  She is getting out more with her and with her conversion van.  She is therefore less depressed.    02/04/15 update:  The patient is following up today.  Last visit, I increased her Stalevo to 125 mg from 100 mg and she takes at 7 AM/11 AM/3 PM/7 PM.  She takes carbidopa/levodopa 25/100 at 9am and 1 pm as well.  She remains on amantadine 100 mg 3 times per day.  She continues to do therapy twice a week and continues to have an in-home caregiver.  When the caregiver is around, she is "bubbly" but she has depressive episodes when she is not there.  Her caregiver is now in nursing school and now only comes every other day instead of every day.  She denies falls.  Rare hallucinations of bugs on the floor and she knows that they arent real.  She no longer sees people.  2004 MRI report: CLINICAL DATA: TREMORS ON RIGHT SIDE.  MRI OF THE BRAIN WITHOUT AND WITH CONTRAST  NO COMPARISON.  THERE IS MILD BRAIN ATROPHY. THERE IS A SMALL HYPERINTENSITY IN THE POSTERIOR LIMB OF THE INTERNAL  CAPSULE ON THE LEFT WHICH APPEARS TO BE AN OLD INFARCT. THE REMAINDER OF THE WHITE MATTER APPEARS  NORMAL. THE BRAINSTEM APPEARS NORMAL. THERE IS NO MASS OR ACUTE INFARCT. THERE IS NO PATHOLOGIC  ENHANCEMENT.  IMPRESSION  NO ACUTE ABNORMALITY.   PREVIOUS MEDICATIONS: Sinemet, Requip and  artane  ALLERGIES:  No Known Allergies  CURRENT MEDICATIONS:  Current Outpatient Prescriptions on File Prior to Visit  Medication Sig Dispense Refill  . ALPRAZolam (XANAX) 0.25 MG tablet Take 0.25 mg by mouth daily. FOR ANXIETY  5  . amantadine (SYMMETREL) 100 MG capsule Take 1 capsule (100 mg total) by mouth 3 (three) times daily. 90 capsule 5  . carbidopa-levodopa (SINEMET IR) 25-100 MG per tablet Take 1 tablet by mouth 2 (two) times daily. 60 tablet 5  . carbidopa-levodopa-entacapone (STALEVO 125) 31.25-125-200 MG per tablet Take 1 tablet by mouth 4 (four) times daily. 120 tablet 4  . digoxin (LANOXIN) 0.25 MG tablet Take 0.25 mg by mouth daily.     Marland Kitchen FLUoxetine (PROZAC) 40 MG capsule Take 40 mg by mouth daily.    . metoprolol tartrate (LOPRESSOR) 25 MG tablet Take 0.5 tablets (12.5 mg total) by mouth 2 (two) times daily. 30 tablet 0  . Multiple Vitamin (MULTIVITAMIN) tablet Take 1 tablet by mouth daily.    . simvastatin (ZOCOR) 40 MG tablet Take 40 mg by mouth every evening.    . warfarin (COUMADIN) 5 MG tablet Take 1 tablet (5 mg total) by mouth daily. Take 1 (5 mg) tablet by mouth daily. Follow with your PCP regarding dosing and INR monitoring.     No current facility-administered medications on file prior to visit.    PAST MEDICAL HISTORY:   Past Medical History  Diagnosis Date  . Parkinson's disease     dx circa 1990 by Dr. Thad Ranger  . Hyperlipidemia     dx circa 2008  . Hypertension     dx circa 2008  . Atrial fibrillation   . Osteoarthritis     "L knee primarily" (05/17/2014)  . Depression     PAST SURGICAL HISTORY:   Past Surgical History  Procedure Laterality Date  . Colonoscopy      2006 by Jeani Hawking (clear per pt)  . Tonsillectomy  ~ 1950  . Tubal ligation  ~ 1983    SOCIAL HISTORY:   Social History   Social History  . Marital Status: Married    Spouse Name: N/A  . Number of Children: 1  . Years of Education: 16   Occupational History  . Social  worker, IT consultant, then [employer]      clerical at [employer]   Social History Main Topics  . Smoking status: Never Smoker   . Smokeless tobacco: Never Used  . Alcohol Use: 1.8 oz/week    0 Standard drinks or equivalent, 3 Glasses of wine per week  . Drug Use: No  . Sexual Activity: Not Currently   Other Topics Concern  . Not on file   Social History Narrative    FAMILY HISTORY:   Family Status  Relation Status Death Age  . Father Deceased 52    AAA  . Mother Deceased 80    "old age"  . Sister Alive 35  healthy  . Child Alive     1 son, healthy    ROS:  A complete 10 system review of systems was obtained and was unremarkable apart from what is mentioned above.  PHYSICAL EXAMINATION:    Seen at 10 AM, and hasn't yet taken medication.    VITALS:   Filed Vitals:   02/04/15 0955  BP: 128/70  Pulse: 55    GEN:  The patient appears stated age and is in NAD. HEENT:  Normocephalic, atraumatic.  The mucous membranes are moist. The superficial temporal arteries are without ropiness or tenderness. CV:  Irregular. Lungs:  CTAB Neck/HEME:  There are no carotid bruits bilaterally. Exts:  There is LE edema  Neurological examination:  Orientation: A MoCA was performed on 12/11/13 and the patient scored 26/30.  The patient is alert and oriented x3 today.  Cranial nerves: There is good facial symmetry. The visual fields are full to confrontational testing. The speech is fluent and clear. Soft palate rises symmetrically and there is no tongue deviation. Hearing is intact to conversational tone. Sensation: Sensation is intact to light touch throughout Motor: Strength is 5/5 in the bilateral upper and lower extremities.   Shoulder shrug is equal and symmetric.  There is no pronator drift.   Movement examination: Tone: Normal tone in the bilateral upper extremities today.  Abnormal movements: There is near constant independent tremor in the right and  left upper extremity and mild independent tremor in the LE's Coordination:  There is mild decremation with RAM's, seen bilaterally but much more pronounced in the left hemibody, Including alternating supination and pronation of the forearm, hand opening and closing, finger taps, heel taps and toe taps. Gait and Station: strapped in her wheelchair.  Doesn't walk much any longer.    ASSESSMENT/PLAN:  1.  Parkinsonism.  -It is certainly possible that this represents idiopathic Parkinson's disease, but I am also considering the possibility of MSA.    -continue Stalevo to 125 mg at 7 AM/11 AM/3 PM/7pm, and carbidopa/levodopa 25/100 at 9 AM and 1 PM.   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    -  I. think she would benefit greatly from inpatient rehabilitation but has refused  -She will continue on amantadine 100 mg tid. 2.  Excess EtOH use  -  She has greatly decreased her alcohol intake to 3 times per week. 3.  Depression.  -She is doing some better with her new caregiver.  I do worry that as her caregiver gets more involved with nursing school and is able to spend less time with the patient that this will be detrimental.  Pt has developed strong emotional attachment to several of her caregivers.  She and I discussed this in detail today.   4.  F/u in the next few months, sooner should new neurologic issues arise.  Much greater than 50% of this visit was spent in counseling with the patient and the family.  Total face to face time:  25 min

## 2015-03-14 ENCOUNTER — Other Ambulatory Visit: Payer: Self-pay | Admitting: Neurology

## 2015-03-14 MED ORDER — CARBIDOPA-LEVODOPA-ENTACAPONE 31.25-125-200 MG PO TABS: 1.0000 | ORAL_TABLET | Freq: Four times a day (QID) | ORAL | Status: DC

## 2015-03-14 NOTE — Telephone Encounter (Signed)
Stalevo refill requested. Per last office note- patient to remain on medication. Refill approved and sent to patient's pharmacy.   

## 2015-04-11 ENCOUNTER — Other Ambulatory Visit: Payer: Self-pay | Admitting: Neurology

## 2015-04-11 MED ORDER — CARBIDOPA-LEVODOPA 25-100 MG PO TABS: 1.0000 | ORAL_TABLET | Freq: Two times a day (BID) | ORAL | Status: DC

## 2015-04-11 NOTE — Telephone Encounter (Signed)
Carbidopa Levodopa refill requested. Per last office note- patient to remain on medication. Refill approved and sent to patient's pharmacy.   

## 2015-07-07 ENCOUNTER — Ambulatory Visit: Payer: Medicare Other | Admitting: Neurology

## 2015-07-19 ENCOUNTER — Other Ambulatory Visit (HOSPITAL_COMMUNITY)
Admission: RE | Admit: 2015-07-19 | Discharge: 2015-07-19 | Disposition: A | Discharge status: Home / Self Care | Payer: Medicare Other | Source: Other Acute Inpatient Hospital | Attending: Family Medicine | Admitting: Family Medicine

## 2015-07-19 DIAGNOSIS — Z7901 Long term (current) use of anticoagulants: Secondary | ICD-10-CM | POA: Diagnosis present

## 2015-07-19 LAB — PROTIME-INR
INR: 1.37 (ref 0.00–1.49)
Prothrombin Time: 16.4 seconds — ABNORMAL HIGH (ref 11.6–15.2)

## 2015-08-31 ENCOUNTER — Other Ambulatory Visit: Payer: Self-pay | Admitting: Neurology

## 2015-08-31 MED ORDER — CARBIDOPA-LEVODOPA 25-100 MG PO TABS: 1.0000 | ORAL_TABLET | Freq: Two times a day (BID) | ORAL | Status: DC

## 2015-08-31 NOTE — Telephone Encounter (Signed)
Carbidopa Levodopa 25/100 refill requested. Per last office note- patient to remain on medication. Refill approved and sent to patient's pharmacy.   

## 2015-09-14 ENCOUNTER — Other Ambulatory Visit: Payer: Self-pay | Admitting: Neurology

## 2015-09-14 MED ORDER — CARBIDOPA-LEVODOPA 25-100 MG PO TABS: 1.0000 | ORAL_TABLET | Freq: Two times a day (BID) | ORAL | Status: DC

## 2015-09-14 MED ORDER — AMANTADINE HCL 100 MG PO CAPS: 100.0000 mg | ORAL_CAPSULE | Freq: Three times a day (TID) | ORAL | Status: DC

## 2015-09-14 NOTE — Telephone Encounter (Signed)
Amantadine and Carbidopa Levodopa refill requested. Per last office note- patient to remain on medication. Refill approved and sent to patient's pharmacy for one month with note that she needs appt for further refills.

## 2015-10-05 ENCOUNTER — Other Ambulatory Visit: Payer: Self-pay | Admitting: Neurology

## 2015-10-05 NOTE — Telephone Encounter (Signed)
Refills denied. Needs appt.

## 2015-10-26 ENCOUNTER — Other Ambulatory Visit: Payer: Self-pay | Admitting: Neurology

## 2015-12-09 ENCOUNTER — Encounter: Payer: Self-pay | Admitting: Neurology

## 2015-12-09 ENCOUNTER — Ambulatory Visit (INDEPENDENT_AMBULATORY_CARE_PROVIDER_SITE_OTHER): Payer: Medicare Other | Admitting: Neurology

## 2015-12-09 VITALS — BP 120/88 | HR 76

## 2015-12-09 DIAGNOSIS — G20 Parkinson's disease: Secondary | ICD-10-CM | POA: Diagnosis not present

## 2015-12-09 DIAGNOSIS — F331 Major depressive disorder, recurrent, moderate: Secondary | ICD-10-CM

## 2015-12-09 MED ORDER — CARBIDOPA-LEVODOPA-ENTACAPONE 31.25-125-200 MG PO TABS: 1.0000 | ORAL_TABLET | Freq: Four times a day (QID) | ORAL | Status: DC

## 2015-12-09 MED ORDER — AMANTADINE HCL 100 MG PO CAPS: 100.0000 mg | ORAL_CAPSULE | Freq: Three times a day (TID) | ORAL | Status: DC

## 2015-12-09 NOTE — Progress Notes (Signed)
Elmer SowCarol B Niemczyk was seen today in the movement disorders clinic for neurologic follow up regarding parkinsonism.     This patient is accompanied in the office by her spouse  [name] who supplements the history.    The last note that I have from [name] was May, 2013.  The patient was seen by [name] prior to Dr. Anne HahnWillis.  She has had symptoms ever since 2001.  The patient reports first sx was tremor in the R hand that was intermittent.  She is R hand dominant.  She now rarely has tremor in the L hand and has had that for about 2 years.  She believes that her first drug was requip.  She cannot remember the year and is unsure if the requip was or is helpful.  Several years later, she started on levodopa.  It was beneficial but not dramatic.  She was changed to stalevo in May 2013.  She is unsure if the change was helpful.    Currently, the patient is on Stalevo 100 mg 3 times a day.  She is on Requip 3 mg 3 times a day.  She was previously on Artane.  11/05/12  Last visit, I decreased her Requip to 2 mg 3 times a day because of hallucinations and LE edema.  She has had less hallucinations but states that she "sort of misses the people."   She states that the "people in the backyard have left."  She still has some visual hallucinations, but most of that is seeing faces in trees.   She is on Stalevo 100 tid.  She was to change the timing of her stalevo last visit but she is still taking it at 9:30am, 4:30pm and 9:30 pm and she awakens at 6:30 am and goes to bed at 10pm. She is still doing home PT and is working on transferring from the hover round to the manual wheelchair.  She doesn't want to go to the neurorehab center because she is emotionally attached to her therapists.  Overall the pt and her husband agree that she is doing better  02/11/13 update:  Last visit, I decreased the patient's Requip because of hallucinations.  She no longer has "visitors" in the backyard and "I kind of miss  those." Her Stalevo was increased to 100 mg 4 times per day.  Her husband feels that her medication is "erratic" and sometimes he notices when it helps and other times he is not so sure. I referred her to [name] last visit, and she reports that she has been to see him several times.  She is not sure if this helped, but her husband states that the patient "likes talking to him."  There is still significant difficulty with transfers.  She had 1 fall similar to her previous one where she slipped down between a chair.  This is the only fall that she is, but she does not walk much.  She relies completely on her husband to help transfer.  This has become somewhat more difficult.  She did get a scooter and he modified that to make it more comfortable, but she still has difficulty getting on and off.  02/17/13 update:  Pt presents today for on/off testing.  She has been off all med (was told to d/c levodopa but remain on other meds) for over 24 hours.  She feels more stiff and shaky since being off of the medication.  No falls.  Does not wish to consider  going to more intensive rehabilitation therapy at least until January.  She has had significant difficulty transferring.  No hallucinations.  06/22/13 update:  Pt presents today for f/u, accompanied by her husband who supplements the hx.  The pt states that she has generally been better.  Her mind has been much more clear.  The patient still states that she misses the people she is to see in the hallucinations.  She still does have some hallucinations, but her husband states that there nothing like they used to be.  She still has the physical therapist coming on.  Her husband she is much easier transfer, but she still cannot walk.  She does state that the tremor can be very bothersome.  She wonders if going up on the levodopa can help.  She is currently on Stalevo at 7 AM/11 AM/3 PM/7 PM and carbidopa/levodopa 25/100 at 9 AM and 1 PM.  She remains on ropirinole 2  mg, half tablet 3 times per day.  08/10/13 update:  Pt presents today for f/u, accompanied by her husband who supplements the history.  She is currently on Stalevo at 7 AM/11 AM/3 PM/7 PM and carbidopa/levodopa 25/100 at 9 AM and 1 PM.  Requip has been discontinued since last visit.  Her cognition is improved.  Hallucinations are resolved. Last visit, amantadine 100 mg tid was added.  Tremor is overall better but she has good and bad days.  She has refused both aqua therapy and in patient rehab, which I think would benefit her greatly.  She has refused physiatry consultation.  She is still having depression and is working this out with [name].  He told her that she needs to find a hobby.  She has no SI/HI.    12/11/13 update:  Pt presents today for f/u, accompanied by her husband who supplements the history.  She is currently on Stalevo at 7 AM/11 AM/3 PM/7 PM and carbidopa/levodopa 25/100 at 9 AM and 1 PM. She is also on amantadine 100 tid.  Since our last visit, I did have the opportunity to talk to one of her physical therapist that works with her at home.  Her physical therapist provided the history that there are times that she arrived at the home and the patient is drunk or has drank too much alcohol.  Feels that she does that to control depression.  Pts husband states that she had some cognitive issues last week but pt is insistent that she must have been falling asleep and was dreaming when he was talking to her.  He doesn't say anything further when she begins to disagree about cognitive changes.  Was to get a WC mobility eval at the neurorehab center; they state that the rehab center didn't call them but when the rehab center was contacted while they were in the office, the rehab center stated that they did contact the patient but they couldn't be reached.  Pt hasn't f/u with PCP since Jan  01/04/14 update:  Pt seen today, much sooner than anticipated.  Accompanied by husband who supplements the  history.   She is currently on Stalevo at 7 AM/11 AM/3 PM/7 PM and carbidopa/levodopa 25/100 at 9 AM and 1 PM. She is also on amantadine 100 tid.  She presents today for a mobility evaluation.  Currently, the patient uses a scooter, but the central drive shaft is in the way, and she cannot get her feet around it.  Her husband has to literally push her foot  around it, but the rigidity is a significant that he feels that he is going to cause damage to her foot when he does it.  He has had to modify her scooter because her feet do not otherwise lay on a normal scooter properly.  The patient cannot ambulate at all on her own with any type of ambulatory assistive device.  She cannot operate a manual wheelchair, as her arms are too weak and too tremulous.  She has a history of atrial fibrillation as well, which limits her ability from heart standpoint to maneuver around.  She does have the physical and mental ability to operate a power wheelchair safely.  Her home has been measured, and all doorways are able to accommodate a power wheelchair, with the exception of one bathroom, but she has other bathrooms in the home to accommodate it just fine.  04/16/14 update:  Patient is following up today, accompanied by her husband and a new caregiver who supplements the history.  Patient remains on Stalevo 100 at 7 AM/11 AM/3 PM/7 PM and an additional carbidopa/levodopa 25/100 at 9 AM and 1 PM.  She is also on amantadine 100 mg 3 times per day.  Despite the fact that we have been working for almost a year on trying to get her a power wheelchair, she still does not have it.  She is frustrated by this.  She does have a new caregiver and is doing more with her but cannot get out of the home much.  Awaiting on both the power WC and a new van.  No falls.  No hallucinations.  Pt decreased alcohol to 3 glasses wine per week.  09/17/14 update:  Pt is following up today, accompanied by her caregiver who supplements the history.   Patient  remains on Stalevo 100 at 7 AM/11 AM/3 PM/7 PM and is supposed to be on an additional carbidopa/levodopa 25/100 at 9 AM and 1 PM but she doesn't think that she is on that and doesn't know when/how that got dropped.  It was last RX in July and that was a 6 month supply.  She is also on amantadine 100 mg 3 times per day.   Tremor has definitely increased since last visit.  She finally was able to get her power WC.  The records that were made available to me were reviewed.  She was admitted to the hospital in Dec for UTI and a-fib with RVR, after she ran out of metoprolol.   She is still doing PT 2 days a week.  Her caregiver is there 6 days a week.  She is getting out more with her and with her conversion van.  She is therefore less depressed.    02/04/15 update:  The patient is following up today.  Last visit, I increased her Stalevo to 125 mg from 100 mg and she takes at 7 AM/11 AM/3 PM/7 PM.  She takes carbidopa/levodopa 25/100 at 9am and 1 pm as well.  She remains on amantadine 100 mg 3 times per day.  She continues to do therapy twice a week and continues to have an in-home caregiver.  When the caregiver is around, she is "bubbly" but she has depressive episodes when she is not there.  Her caregiver is now in nursing school and now only comes every other day instead of every day.  She denies falls.  Rare hallucinations of bugs on the floor and she knows that they arent real.  She no longer sees people.  12/09/15 update:  The patient is following up today, accompanied by her caregiver who supplements the history.  I have not seen the patient in 10 months, as she has not followed up.  She reports that she is still on Stalevo to 125 mg from 100 mg and she takes at 7 AM/11 AM/3 PM/7 PM.  She remains on amantadine 100 mg 3 times per day.  I did review records that are available on care everywhere.  She saw her PA on May 17 after a long hiatus there is.  She restarted her Prozac.  She refilled her Xanax for one time  per day.  I reviewed some of her lab work.  Her hemoglobin A1c was 6.3.  She is doing PT one time a week.  Otherwise, she is doing very little exercise and it sounds like she is not getting out of the home.  Reports that EtOH is sporadic.  Sometimes goes weeks without and then will have wine daily.     2004 MRI report: CLINICAL DATA: TREMORS ON RIGHT SIDE.  MRI OF THE BRAIN WITHOUT AND WITH CONTRAST  NO COMPARISON.  THERE IS MILD BRAIN ATROPHY. THERE IS A SMALL HYPERINTENSITY IN THE POSTERIOR LIMB OF THE INTERNAL  CAPSULE ON THE LEFT WHICH APPEARS TO BE AN OLD INFARCT. THE REMAINDER OF THE WHITE MATTER APPEARS  NORMAL. THE BRAINSTEM APPEARS NORMAL. THERE IS NO MASS OR ACUTE INFARCT. THERE IS NO PATHOLOGIC  ENHANCEMENT.  IMPRESSION  NO ACUTE ABNORMALITY.   PREVIOUS MEDICATIONS: Sinemet, Requip and artane  ALLERGIES:  No Known Allergies  CURRENT MEDICATIONS:  Current Outpatient Prescriptions on File Prior to Visit  Medication Sig Dispense Refill  . ALPRAZolam (XANAX) 0.25 MG tablet Take 0.25 mg by mouth daily. FOR ANXIETY  5  . carbidopa-levodopa (SINEMET IR) 25-100 MG tablet Take 1 tablet by mouth 2 (two) times daily. NEEDS APPT FOR FURTHER REFILLS 60 tablet 0  . digoxin (LANOXIN) 0.25 MG tablet Take 0.25 mg by mouth daily.     Marland Kitchen FLUoxetine (PROZAC) 40 MG capsule Take 40 mg by mouth daily.    . metoprolol tartrate (LOPRESSOR) 25 MG tablet Take 0.5 tablets (12.5 mg total) by mouth 2 (two) times daily. 30 tablet 0  . Multiple Vitamin (MULTIVITAMIN) tablet Take 1 tablet by mouth daily.    . Multiple Vitamins-Minerals (EYE VITAMINS PO) Take by mouth daily.    . simvastatin (ZOCOR) 40 MG tablet Take 40 mg by mouth every evening.    . warfarin (COUMADIN) 5 MG tablet Take 1 tablet (5 mg total) by mouth daily. Take 1 (5 mg) tablet by mouth daily. Follow with your PCP regarding dosing and INR monitoring.     No current facility-administered medications on file prior to visit.    PAST MEDICAL  HISTORY:   Past Medical History  Diagnosis Date  . Parkinson's disease     dx circa 1990 by Dr. Thad Ranger  . Hyperlipidemia     dx circa 2008  . Hypertension     dx circa 2008  . Atrial fibrillation (HCC)   . Osteoarthritis     "L knee primarily" (05/17/2014)  . Depression     PAST SURGICAL HISTORY:   Past Surgical History  Procedure Laterality Date  . Colonoscopy      2006 by Jeani Hawking (clear per pt)  . Tonsillectomy  ~ 1950  . Tubal ligation  ~ 1983    SOCIAL HISTORY:   Social History   Social History  .  Marital Status: Married    Spouse Name: N/A  . Number of Children: 1  . Years of Education: 16   Occupational History  . 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   Social History Main Topics  . Smoking status: Never Smoker   . Smokeless tobacco: Never Used  . Alcohol Use: 1.8 oz/week    0 Standard drinks or equivalent, 3 Glasses of wine per week  . Drug Use: No  . Sexual Activity: Not Currently   Other Topics Concern  . Not on file   Social History Narrative    FAMILY HISTORY:   Family Status  Relation Status Death Age  . Father Deceased 47    AAA  . Mother Deceased 40    "old age"  . Sister Alive 45    healthy  . Child Alive     1 son, healthy    ROS:  A complete 10 system review of systems was obtained and was unremarkable apart from what is mentioned above.  PHYSICAL EXAMINATION:      VITALS:   Filed Vitals:   12/09/15 1034  BP: 120/88  Pulse: 76    GEN:  The patient appears stated age and is in NAD. HEENT:  Normocephalic, atraumatic.  The mucous membranes are moist. The superficial temporal arteries are without ropiness or tenderness. CV:  Irregular. Lungs:  CTAB Neck/HEME:  There are no carotid bruits bilaterally.   Neurological examination:  Orientation: A MoCA was performed on 12/11/13 and the patient scored 26/30.  The patient is alert and oriented x3 today.   Cranial nerves: There is good facial symmetry. The visual fields are full to confrontational testing. The speech is fluent and clear. Soft palate rises symmetrically and there is no tongue deviation. Hearing is intact to conversational tone. Sensation: Sensation is intact to light touch throughout Motor: Strength is 5/5 in the bilateral upper and lower extremities.   Shoulder shrug is equal and symmetric.  There is no pronator drift.   Movement examination: Tone: Normal tone in the bilateral upper extremities today.  Abnormal movements: There is intermittent UE resting tremor.  Minor truncal dyskinesia.   Coordination:  There is mild decremation with RAM's, seen bilaterally but much more pronounced in the left hemibody, Including alternating supination and pronation of the forearm, hand opening and closing, finger taps, heel taps and toe taps.  The LE are worse than the UE Gait and Station: Iin her manual wheelchair.  Doesn't walk any more    ASSESSMENT/PLAN:  1.  Parkinsons  -continue Stalevo to 125 mg at 7 AM/11 AM/3 PM/7pm, and carbidopa/levodopa 25/100 at 9 AM and 1 PM.   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    -  I. think she would benefit greatly from inpatient rehabilitation but has refused  -She will continue on amantadine 100 mg tid. 2.  Excess EtOH use  -  She has greatly decreased her alcohol intake to 3 times per week.  I addressed this alcohol intake again with her today, as she hesitated a little bit when I asked her about the amount that she was taking in. 3.  Depression.  -Has been the greatest barrier to good health.  I had a long discussion with her  today about getting out of the home.  She has not even been getting out of the home this past year for doctor's appointments.  I told her that she needs  to commit to getting out of the home at least one day a week.  She has access to the  [employer] transportation, but will not use it unless she has somebody with her.  She is very dependent on her caregiver for more support and already is dreading the fact that her caregiver will be graduating from college next year.  She was talking about the fact that the physical therapist who has been with her for a long time and just left on maternity leave.  She has near abnormal attachments to some of her caregivers which has been detrimental to her health.   4.  F/u in the next few months, sooner should new neurologic issues arise.  Much greater than 50% of this visit was spent in counseling with the patient and thecaregiver.  Total face to face time:  25 min

## 2016-02-23 ENCOUNTER — Other Ambulatory Visit: Payer: Self-pay | Admitting: Neurology

## 2016-04-24 ENCOUNTER — Other Ambulatory Visit: Payer: Self-pay | Admitting: Neurology

## 2016-06-07 NOTE — Progress Notes (Deleted)
Christy Pham was seen today in the movement disorders clinic for neurologic follow up regarding parkinsonism.     This patient is accompanied in the office by her spouse  [name] who supplements the history.    The last note that I have from [name] was May, 2013.  The patient was seen by [name] prior to Dr. Anne HahnWillis.  She has had symptoms ever since 2001.  The patient reports first sx was tremor in the R hand that was intermittent.  She is R hand dominant.  She now rarely has tremor in the L hand and has had that for about 2 years.  She believes that her first drug was requip.  She cannot remember the year and is unsure if the requip was or is helpful.  Several years later, she started on levodopa.  It was beneficial but not dramatic.  She was changed to stalevo in May 2013.  She is unsure if the change was helpful.    Currently, the patient is on Stalevo 100 mg 3 times a day.  She is on Requip 3 mg 3 times a day.  She was previously on Artane.  11/05/12  Last visit, I decreased her Requip to 2 mg 3 times a day because of hallucinations and LE edema.  She has had less hallucinations but states that she "sort of misses the people."   She states that the "people in the backyard have left."  She still has some visual hallucinations, but most of that is seeing faces in trees.   She is on Stalevo 100 tid.  She was to change the timing of her stalevo last visit but she is still taking it at 9:30am, 4:30pm and 9:30 pm and she awakens at 6:30 am and goes to bed at 10pm. She is still doing home PT and is working on transferring from the hover round to the manual wheelchair.  She doesn't want to go to the neurorehab center because she is emotionally attached to her therapists.  Overall the pt and her husband agree that she is doing better  02/11/13 update:  Last visit, I decreased the patient's Requip because of hallucinations.  She no longer has "visitors" in the backyard and "I kind of miss  those." Her Stalevo was increased to 100 mg 4 times per day.  Her husband feels that her medication is "erratic" and sometimes he notices when it helps and other times he is not so sure. I referred her to [name] last visit, and she reports that she has been to see him several times.  She is not sure if this helped, but her husband states that the patient "likes talking to him."  There is still significant difficulty with transfers.  She had 1 fall similar to her previous one where she slipped down between a chair.  This is the only fall that she is, but she does not walk much.  She relies completely on her husband to help transfer.  This has become somewhat more difficult.  She did get a scooter and he modified that to make it more comfortable, but she still has difficulty getting on and off.  02/17/13 update:  Pt presents today for on/off testing.  She has been off all med (was told to d/c levodopa but remain on other meds) for over 24 hours.  She feels more stiff and shaky since being off of the medication.  No falls.  Does not wish to consider  going to more intensive rehabilitation therapy at least until January.  She has had significant difficulty transferring.  No hallucinations.  06/22/13 update:  Pt presents today for f/u, accompanied by her husband who supplements the hx.  The pt states that she has generally been better.  Her mind has been much more clear.  The patient still states that she misses the people she is to see in the hallucinations.  She still does have some hallucinations, but her husband states that there nothing like they used to be.  She still has the physical therapist coming on.  Her husband she is much easier transfer, but she still cannot walk.  She does state that the tremor can be very bothersome.  She wonders if going up on the levodopa can help.  She is currently on Stalevo at 7 AM/11 AM/3 PM/7 PM and carbidopa/levodopa 25/100 at 9 AM and 1 PM.  She remains on ropirinole 2  mg, half tablet 3 times per day.  08/10/13 update:  Pt presents today for f/u, accompanied by her husband who supplements the history.  She is currently on Stalevo at 7 AM/11 AM/3 PM/7 PM and carbidopa/levodopa 25/100 at 9 AM and 1 PM.  Requip has been discontinued since last visit.  Her cognition is improved.  Hallucinations are resolved. Last visit, amantadine 100 mg tid was added.  Tremor is overall better but she has good and bad days.  She has refused both aqua therapy and in patient rehab, which I think would benefit her greatly.  She has refused physiatry consultation.  She is still having depression and is working this out with [name].  He told her that she needs to find a hobby.  She has no SI/HI.    12/11/13 update:  Pt presents today for f/u, accompanied by her husband who supplements the history.  She is currently on Stalevo at 7 AM/11 AM/3 PM/7 PM and carbidopa/levodopa 25/100 at 9 AM and 1 PM. She is also on amantadine 100 tid.  Since our last visit, I did have the opportunity to talk to one of her physical therapist that works with her at home.  Her physical therapist provided the history that there are times that she arrived at the home and the patient is drunk or has drank too much alcohol.  Feels that she does that to control depression.  Pts husband states that she had some cognitive issues last week but pt is insistent that she must have been falling asleep and was dreaming when he was talking to her.  He doesn't say anything further when she begins to disagree about cognitive changes.  Was to get a WC mobility eval at the neurorehab center; they state that the rehab center didn't call them but when the rehab center was contacted while they were in the office, the rehab center stated that they did contact the patient but they couldn't be reached.  Pt hasn't f/u with PCP since Jan  01/04/14 update:  Pt seen today, much sooner than anticipated.  Accompanied by husband who supplements the  history.   She is currently on Stalevo at 7 AM/11 AM/3 PM/7 PM and carbidopa/levodopa 25/100 at 9 AM and 1 PM. She is also on amantadine 100 tid.  She presents today for a mobility evaluation.  Currently, the patient uses a scooter, but the central drive shaft is in the way, and she cannot get her feet around it.  Her husband has to literally push her foot  around it, but the rigidity is a significant that he feels that he is going to cause damage to her foot when he does it.  He has had to modify her scooter because her feet do not otherwise lay on a normal scooter properly.  The patient cannot ambulate at all on her own with any type of ambulatory assistive device.  She cannot operate a manual wheelchair, as her arms are too weak and too tremulous.  She has a history of atrial fibrillation as well, which limits her ability from heart standpoint to maneuver around.  She does have the physical and mental ability to operate a power wheelchair safely.  Her home has been measured, and all doorways are able to accommodate a power wheelchair, with the exception of one bathroom, but she has other bathrooms in the home to accommodate it just fine.  04/16/14 update:  Patient is following up today, accompanied by her husband and a new caregiver who supplements the history.  Patient remains on Stalevo 100 at 7 AM/11 AM/3 PM/7 PM and an additional carbidopa/levodopa 25/100 at 9 AM and 1 PM.  She is also on amantadine 100 mg 3 times per day.  Despite the fact that we have been working for almost a year on trying to get her a power wheelchair, she still does not have it.  She is frustrated by this.  She does have a new caregiver and is doing more with her but cannot get out of the home much.  Awaiting on both the power WC and a new van.  No falls.  No hallucinations.  Pt decreased alcohol to 3 glasses wine per week.  09/17/14 update:  Pt is following up today, accompanied by her caregiver who supplements the history.   Patient  remains on Stalevo 100 at 7 AM/11 AM/3 PM/7 PM and is supposed to be on an additional carbidopa/levodopa 25/100 at 9 AM and 1 PM but she doesn't think that she is on that and doesn't know when/how that got dropped.  It was last RX in July and that was a 6 month supply.  She is also on amantadine 100 mg 3 times per day.   Tremor has definitely increased since last visit.  She finally was able to get her power WC.  The records that were made available to me were reviewed.  She was admitted to the hospital in Dec for UTI and a-fib with RVR, after she ran out of metoprolol.   She is still doing PT 2 days a week.  Her caregiver is there 6 days a week.  She is getting out more with her and with her conversion van.  She is therefore less depressed.    02/04/15 update:  The patient is following up today.  Last visit, I increased her Stalevo to 125 mg from 100 mg and she takes at 7 AM/11 AM/3 PM/7 PM.  She takes carbidopa/levodopa 25/100 at 9am and 1 pm as well.  She remains on amantadine 100 mg 3 times per day.  She continues to do therapy twice a week and continues to have an in-home caregiver.  When the caregiver is around, she is "bubbly" but she has depressive episodes when she is not there.  Her caregiver is now in nursing school and now only comes every other day instead of every day.  She denies falls.  Rare hallucinations of bugs on the floor and she knows that they arent real.  She no longer sees people.  12/09/15 update:  The patient is following up today, accompanied by her caregiver who supplements the history.  I have not seen the patient in 10 months, as she has not followed up.  She reports that she is still on Stalevo to 125 mg from 100 mg and she takes at 7 AM/11 AM/3 PM/7 PM.  She remains on amantadine 100 mg 3 times per day.  I did review records that are available on care everywhere.  She saw her PA on May 17 after a long hiatus there is.  She restarted her Prozac.  She refilled her Xanax for one time  per day.  I reviewed some of her lab work.  Her hemoglobin A1c was 6.3.  She is doing PT one time a week.  Otherwise, she is doing very little exercise and it sounds like she is not getting out of the home.  Reports that EtOH is sporadic.  Sometimes goes weeks without and then will have wine daily.    06/11/16 update:  Patient follows up today, accompanied by her caregiver who supplements the history.  She is on Stalevo 125 mg at 7 AM/11 AM/3 PM/7 PM and amantadine 100 mg 3 times per day.  On prozac for depression.  Reports better than last visit.  No SI/HI   2004 MRI report: CLINICAL DATA: TREMORS ON RIGHT SIDE.  MRI OF THE BRAIN WITHOUT AND WITH CONTRAST  NO COMPARISON.  THERE IS MILD BRAIN ATROPHY. THERE IS A SMALL HYPERINTENSITY IN THE POSTERIOR LIMB OF THE INTERNAL  CAPSULE ON THE LEFT WHICH APPEARS TO BE AN OLD INFARCT. THE REMAINDER OF THE WHITE MATTER APPEARS  NORMAL. THE BRAINSTEM APPEARS NORMAL. THERE IS NO MASS OR ACUTE INFARCT. THERE IS NO PATHOLOGIC  ENHANCEMENT.  IMPRESSION  NO ACUTE ABNORMALITY.   PREVIOUS MEDICATIONS: Sinemet, Requip and artane  ALLERGIES:  No Known Allergies  CURRENT MEDICATIONS:  Current Outpatient Prescriptions on File Prior to Visit  Medication Sig Dispense Refill  . ALPRAZolam (XANAX) 0.25 MG tablet Take 0.25 mg by mouth daily. FOR ANXIETY  5  . amantadine (SYMMETREL) 100 MG capsule TAKE 1 CAPSULE BY MOUTH THREE TIMES DAILY 270 capsule 3  . carbidopa-levodopa (SINEMET IR) 25-100 MG tablet Take 1 tablet by mouth 2 (two) times daily. NEEDS APPT FOR FURTHER REFILLS 60 tablet 0  . carbidopa-levodopa-entacapone (STALEVO) 31.25-125-200 MG tablet TAKE 1 TABLET BY MOUTH FOUR TIMES DAILY 120 tablet 3  . digoxin (LANOXIN) 0.25 MG tablet Take 0.25 mg by mouth daily.     Marland Kitchen. FLUoxetine (PROZAC) 40 MG capsule Take 40 mg by mouth daily.    . metoprolol tartrate (LOPRESSOR) 25 MG tablet Take 0.5 tablets (12.5 mg total) by mouth 2 (two) times daily. 30 tablet 0  .  Multiple Vitamin (MULTIVITAMIN) tablet Take 1 tablet by mouth daily.    . Multiple Vitamins-Minerals (EYE VITAMINS PO) Take by mouth daily.    . simvastatin (ZOCOR) 40 MG tablet Take 40 mg by mouth every evening.    . warfarin (COUMADIN) 5 MG tablet Take 1 tablet (5 mg total) by mouth daily. Take 1 (5 mg) tablet by mouth daily. Follow with your PCP regarding dosing and INR monitoring.     No current facility-administered medications on file prior to visit.     PAST MEDICAL HISTORY:   Past Medical History:  Diagnosis Date  . Atrial fibrillation (HCC)   . Depression   . Hyperlipidemia    dx circa 2008  . Hypertension  dx circa 2008  . Osteoarthritis    "L knee primarily" (05/17/2014)  . Parkinson's disease    dx circa 1990 by Dr. Thad Ranger    PAST SURGICAL HISTORY:   Past Surgical History:  Procedure Laterality Date  . COLONOSCOPY     2006 by Jeani Hawking (clear per pt)  . TONSILLECTOMY  ~ 1950  . TUBAL LIGATION  ~ 1983    SOCIAL HISTORY:   Social History   Social History  . Marital status: Married    Spouse name: N/A  . Number of children: 1  . Years of education: 20   Occupational History  . 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   Social History Main Topics  . Smoking status: Never Smoker  . Smokeless tobacco: Never Used  . Alcohol use 1.8 oz/week    3 Glasses of wine per week     Comment: 3 times per week  . Drug use: No  . Sexual activity: Not Currently   Other Topics Concern  . Not on file   Social History Narrative  . No narrative on file    FAMILY HISTORY:   Family Status  Relation Status  . Father Deceased at age 41   AAA  . Mother Deceased at age 58   "old age"  . Sister Alive   healthy  . Child Alive   1 son, healthy    ROS:  A complete 10 system review of systems was obtained and was unremarkable apart from what is mentioned above.  PHYSICAL EXAMINATION:      VITALS:    There were no vitals filed for this visit.  GEN:  The patient appears stated age and is in NAD. HEENT:  Normocephalic, atraumatic.  The mucous membranes are moist. The superficial temporal arteries are without ropiness or tenderness. CV:  Irregular. Lungs:  CTAB Neck/HEME:  There are no carotid bruits bilaterally.   Neurological examination:  Orientation: A MoCA was performed on 12/11/13 and the patient scored 26/30.  The patient is alert and oriented x3 today.  Cranial nerves: There is good facial symmetry. The visual fields are full to confrontational testing. The speech is fluent and clear. Soft palate rises symmetrically and there is no tongue deviation. Hearing is intact to conversational tone. Sensation: Sensation is intact to light touch throughout Motor: Strength is 5/5 in the bilateral upper and lower extremities.   Shoulder shrug is equal and symmetric.  There is no pronator drift.   Movement examination: Tone: Normal tone in the bilateral upper extremities today.  Abnormal movements: There is intermittent UE resting tremor.  Minor truncal dyskinesia.   Coordination:  There is mild decremation with RAM's, seen bilaterally but much more pronounced in the left hemibody, Including alternating supination and pronation of the forearm, hand opening and closing, finger taps, heel taps and toe taps.  The LE are worse than the UE Gait and Station: Iin her manual wheelchair.  Doesn't walk any more    ASSESSMENT/PLAN:  1.  Parkinsons  -continue Stalevo to 125 mg at 7 AM/11 AM/3 PM/7pm, and carbidopa/levodopa 25/100 at 9 AM and 1 PM.   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    -  I. think she would benefit greatly from  inpatient rehabilitation but has refused  -She will continue on amantadine 100 mg tid. 2.   Excess EtOH use  -  She has greatly decreased her alcohol intake to 3 times per week.  I addressed this alcohol intake again with her today, as she hesitated a little bit when I asked her about the amount that she was taking in. 3.  Depression.  -Has been the greatest barrier to good health. On Prozac.  Following with PCP.  Has ***returned to therapist. 4.  F/u in the next few months, sooner should new neurologic issues arise.  Much greater than 50% of this visit was spent in counseling with the patient and thecaregiver.  Total face to face time:  25 min

## 2016-06-12 ENCOUNTER — Ambulatory Visit: Payer: Medicare Other | Admitting: Neurology

## 2016-07-03 NOTE — Progress Notes (Signed)
Christy Pham was seen today in the movement disorders clinic for neurologic follow up regarding parkinsonism.     This patient is accompanied in the office by her spouse  Christy Stable(Bill) who supplements the history.    The last note that I have from [name] was May, 2013.  The patient was seen by [name] prior to Dr. Anne HahnWillis.  She has had symptoms ever since 2001.  The patient reports first sx was tremor in the R hand that was intermittent.  She is R hand dominant.  She now rarely has tremor in the L hand and has had that for about 2 years.  She believes that her first drug was requip.  She cannot remember the year and is unsure if the requip was or is helpful.  Several years later, she started on levodopa.  It was beneficial but not dramatic.  She was changed to stalevo in May 2013.  She is unsure if the change was helpful.    Currently, the patient is on Stalevo 100 mg 3 times a day.  She is on Requip 3 mg 3 times a day.  She was previously on Artane.  11/05/12  Last visit, I decreased her Requip to 2 mg 3 times a day because of hallucinations and LE edema.  She has had less hallucinations but states that she "sort of misses the people."   She states that the "people in the backyard have left."  She still has some visual hallucinations, but most of that is seeing faces in trees.   She is on Stalevo 100 tid.  She was to change the timing of her stalevo last visit but she is still taking it at 9:30am, 4:30pm and 9:30 pm and she awakens at 6:30 am and goes to bed at 10pm. She is still doing home PT and is working on transferring from the hover round to the manual wheelchair.  She doesn't want to go to the neurorehab center because she is emotionally attached to her therapists.  Overall the pt and her husband agree that she is doing better  02/11/13 update:  Last visit, I decreased the patient's Requip because of hallucinations.  She no longer has "visitors" in the backyard and "I kind of miss  those." Her Stalevo was increased to 100 mg 4 times per day.  Her husband feels that her medication is "erratic" and sometimes he notices when it helps and other times he is not so sure. I referred her to [name] last visit, and she reports that she has been to see him several times.  She is not sure if this helped, but her husband states that the patient "likes talking to him."  There is still significant difficulty with transfers.  She had 1 fall similar to her previous one where she slipped down between a chair.  This is the only fall that she is, but she does not walk much.  She relies completely on her husband to help transfer.  This has become somewhat more difficult.  She did get a scooter and he modified that to make it more comfortable, but she still has difficulty getting on and off.  02/17/13 update:  Pt presents today for on/off testing.  She has been off all med (was told to d/c levodopa but remain on other meds) for over 24 hours.  She feels more stiff and shaky since being off of the medication.  No falls.  Does not wish to consider  going to more intensive rehabilitation therapy at least until January.  She has had significant difficulty transferring.  No hallucinations.  06/22/13 update:  Pt presents today for f/u, accompanied by her husband who supplements the hx.  The pt states that she has generally been better.  Her mind has been much more clear.  The patient still states that she misses the people she is to see in the hallucinations.  She still does have some hallucinations, but her husband states that there nothing like they used to be.  She still has the physical therapist coming on.  Her husband she is much easier transfer, but she still cannot walk.  She does state that the tremor can be very bothersome.  She wonders if going up on the levodopa can help.  She is currently on Stalevo at 7 AM/11 AM/3 PM/7 PM and carbidopa/levodopa 25/100 at 9 AM and 1 PM.  She remains on ropirinole 2  mg, half tablet 3 times per day.  08/10/13 update:  Pt presents today for f/u, accompanied by her husband who supplements the history.  She is currently on Stalevo at 7 AM/11 AM/3 PM/7 PM and carbidopa/levodopa 25/100 at 9 AM and 1 PM.  Requip has been discontinued since last visit.  Her cognition is improved.  Hallucinations are resolved. Last visit, amantadine 100 mg tid was added.  Tremor is overall better but she has good and bad days.  She has refused both aqua therapy and in patient rehab, which I think would benefit her greatly.  She has refused physiatry consultation.  She is still having depression and is working this out with [name].  He told her that she needs to find a hobby.  She has no SI/HI.    12/11/13 update:  Pt presents today for f/u, accompanied by her husband who supplements the history.  She is currently on Stalevo at 7 AM/11 AM/3 PM/7 PM and carbidopa/levodopa 25/100 at 9 AM and 1 PM. She is also on amantadine 100 tid.  Since our last visit, I did have the opportunity to talk to one of her physical therapist that works with her at home.  Her physical therapist provided the history that there are times that she arrived at the home and the patient is drunk or has drank too much alcohol.  Feels that she does that to control depression.  Pts husband states that she had some cognitive issues last week but pt is insistent that she must have been falling asleep and was dreaming when he was talking to her.  He doesn't say anything further when she begins to disagree about cognitive changes.  Was to get a WC mobility eval at the neurorehab center; they state that the rehab center didn't call them but when the rehab center was contacted while they were in the office, the rehab center stated that they did contact the patient but they couldn't be reached.  Pt hasn't f/u with PCP since Jan  01/04/14 update:  Pt seen today, much sooner than anticipated.  Accompanied by husband who supplements the  history.   She is currently on Stalevo at 7 AM/11 AM/3 PM/7 PM and carbidopa/levodopa 25/100 at 9 AM and 1 PM. She is also on amantadine 100 tid.  She presents today for a mobility evaluation.  Currently, the patient uses a scooter, but the central drive shaft is in the way, and she cannot get her feet around it.  Her husband has to literally push her foot  around it, but the rigidity is a significant that he feels that he is going to cause damage to her foot when he does it.  He has had to modify her scooter because her feet do not otherwise lay on a normal scooter properly.  The patient cannot ambulate at all on her own with any type of ambulatory assistive device.  She cannot operate a manual wheelchair, as her arms are too weak and too tremulous.  She has a history of atrial fibrillation as well, which limits her ability from heart standpoint to maneuver around.  She does have the physical and mental ability to operate a power wheelchair safely.  Her home has been measured, and all doorways are able to accommodate a power wheelchair, with the exception of one bathroom, but she has other bathrooms in the home to accommodate it just fine.  04/16/14 update:  Patient is following up today, accompanied by her husband and a new caregiver who supplements the history.  Patient remains on Stalevo 100 at 7 AM/11 AM/3 PM/7 PM and an additional carbidopa/levodopa 25/100 at 9 AM and 1 PM.  She is also on amantadine 100 mg 3 times per day.  Despite the fact that we have been working for almost a year on trying to get her a power wheelchair, she still does not have it.  She is frustrated by this.  She does have a new caregiver and is doing more with her but cannot get out of the home much.  Awaiting on both the power WC and a new van.  No falls.  No hallucinations.  Pt decreased alcohol to 3 glasses wine per week.  09/17/14 update:  Pt is following up today, accompanied by her caregiver who supplements the history.   Patient  remains on Stalevo 100 at 7 AM/11 AM/3 PM/7 PM and is supposed to be on an additional carbidopa/levodopa 25/100 at 9 AM and 1 PM but she doesn't think that she is on that and doesn't know when/how that got dropped.  It was last RX in July and that was a 6 month supply.  She is also on amantadine 100 mg 3 times per day.   Tremor has definitely increased since last visit.  She finally was able to get her power WC.  The records that were made available to me were reviewed.  She was admitted to the hospital in Dec for UTI and a-fib with RVR, after she ran out of metoprolol.   She is still doing PT 2 days a week.  Her caregiver is there 6 days a week.  She is getting out more with her and with her conversion van.  She is therefore less depressed.    02/04/15 update:  The patient is following up today.  Last visit, I increased her Stalevo to 125 mg from 100 mg and she takes at 7 AM/11 AM/3 PM/7 PM.  She takes carbidopa/levodopa 25/100 at 9am and 1 pm as well.  She remains on amantadine 100 mg 3 times per day.  She continues to do therapy twice a week and continues to have an in-home caregiver.  When the caregiver is around, she is "bubbly" but she has depressive episodes when she is not there.  Her caregiver is now in nursing school and now only comes every other day instead of every day.  She denies falls.  Rare hallucinations of bugs on the floor and she knows that they arent real.  She no longer sees people.  12/09/15 update:  The patient is following up today, accompanied by her caregiver who supplements the history.  I have not seen the patient in 10 months, as she has not followed up.  She reports that she is still on Stalevo to 125 mg from 100 mg and she takes at 7 AM/11 AM/3 PM/7 PM.  She remains on amantadine 100 mg 3 times per day.  I did review records that are available on care everywhere.  She saw her PA on May 17 after a long hiatus there is.  She restarted her Prozac.  She refilled her Xanax for one time  per day.  I reviewed some of her lab work.  Her hemoglobin A1c was 6.3.  She is doing PT one time a week.  Otherwise, she is doing very little exercise and it sounds like she is not getting out of the home.  Reports that EtOH is sporadic.  Sometimes goes weeks without and then will have wine daily.    07/04/16 update:  Patient follows up today, accompanied by her caregiver who supplements the history.   She only has her caregiver for a few more weeks and she is upset about that.   She is on Stalevo 125 mg at 7 AM/11 AM/3 PM/7 PM and amantadine 100 mg 3 times per day.  She takes carbidopa/levodopa 25/100 bid at 9am/1pm. No falls.  Doing PT one time a week but otherwise no physical activity.  On prozac for depression.  Has occasional hallucination of bugs but "the people have left.  I sometimes miss them."  Blinking the eyes corrects the hallucination.     2004 MRI report: CLINICAL DATA: TREMORS ON RIGHT SIDE.  MRI OF THE BRAIN WITHOUT AND WITH CONTRAST  NO COMPARISON.  THERE IS MILD BRAIN ATROPHY. THERE IS A SMALL HYPERINTENSITY IN THE POSTERIOR LIMB OF THE INTERNAL  CAPSULE ON THE LEFT WHICH APPEARS TO BE AN OLD INFARCT. THE REMAINDER OF THE WHITE MATTER APPEARS  NORMAL. THE BRAINSTEM APPEARS NORMAL. THERE IS NO MASS OR ACUTE INFARCT. THERE IS NO PATHOLOGIC  ENHANCEMENT.  IMPRESSION  NO ACUTE ABNORMALITY.   PREVIOUS MEDICATIONS: Sinemet, Requip and artane  ALLERGIES:  No Known Allergies  CURRENT MEDICATIONS:  Current Outpatient Prescriptions on File Prior to Visit  Medication Sig Dispense Refill  . ALPRAZolam (XANAX) 0.25 MG tablet Take 0.25 mg by mouth daily. FOR ANXIETY  5  . amantadine (SYMMETREL) 100 MG capsule TAKE 1 CAPSULE BY MOUTH THREE TIMES DAILY 270 capsule 3  . carbidopa-levodopa-entacapone (STALEVO) 31.25-125-200 MG tablet TAKE 1 TABLET BY MOUTH FOUR TIMES DAILY 120 tablet 3  . digoxin (LANOXIN) 0.25 MG tablet Take 0.25 mg by mouth daily.     Marland Kitchen. FLUoxetine (PROZAC) 40 MG capsule  Take 40 mg by mouth daily.    . metoprolol tartrate (LOPRESSOR) 25 MG tablet Take 0.5 tablets (12.5 mg total) by mouth 2 (two) times daily. 30 tablet 0  . Multiple Vitamin (MULTIVITAMIN) tablet Take 1 tablet by mouth daily.    . Multiple Vitamins-Minerals (EYE VITAMINS PO) Take by mouth daily.    . simvastatin (ZOCOR) 40 MG tablet Take 40 mg by mouth every evening.    . warfarin (COUMADIN) 5 MG tablet Take 1 tablet (5 mg total) by mouth daily. Take 1 (5 mg) tablet by mouth daily. Follow with your PCP regarding dosing and INR monitoring.     No current facility-administered medications on file prior to visit.     PAST MEDICAL HISTORY:  Past Medical History:  Diagnosis Date  . Atrial fibrillation (HCC)   . Depression   . Hyperlipidemia    dx circa 2008  . Hypertension    dx circa 2008  . Osteoarthritis    "L knee primarily" (05/17/2014)  . Parkinson's disease    dx circa 1990 by Dr. Thad Ranger    PAST SURGICAL HISTORY:   Past Surgical History:  Procedure Laterality Date  . COLONOSCOPY     2006 by Jeani Hawking (clear per pt)  . TONSILLECTOMY  ~ 1950  . TUBAL LIGATION  ~ 1983    SOCIAL HISTORY:   Social History   Social History  . Marital status: Married    Spouse name: N/A  . Number of children: 1  . Years of education: 25   Occupational History  . 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   Social History Main Topics  . Smoking status: Never Smoker  . Smokeless tobacco: Never Used  . Alcohol use 1.8 oz/week    3 Glasses of wine per week     Comment: 3 times per week (wine)  . Drug use: No  . Sexual activity: Not Currently   Other Topics Concern  . Not on file   Social History Narrative  . No narrative on file    FAMILY HISTORY:   Family Status  Relation Status  . Father Deceased at age 44   AAA  . Mother Deceased at age 65   "old age"  . Sister Alive   healthy  . Child Alive   1 son, healthy     ROS:  A complete 10 system review of systems was obtained and was unremarkable apart from what is mentioned above.  PHYSICAL EXAMINATION:      VITALS:   Vitals:   07/04/16 0812  BP: 102/60  Pulse: 75    GEN:  The patient appears stated age and is in NAD. HEENT:  Normocephalic, atraumatic.  The mucous membranes are moist. The superficial temporal arteries are without ropiness or tenderness. CV:  Irregular. Lungs:  CTAB Neck/HEME:  There are no carotid bruits bilaterally.   Neurological examination:  Orientation: A MoCA was performed on 12/11/13 and the patient scored 26/30.  The patient is alert and oriented x3 today.  Cranial nerves: There is good facial symmetry. The visual fields are full to confrontational testing. The speech is fluent and clear. Soft palate rises symmetrically and there is no tongue deviation. Hearing is intact to conversational tone. Sensation: Sensation is intact to light touch throughout Motor: Strength is 5/5 in the bilateral upper and lower extremities.   Shoulder shrug is equal and symmetric.  There is no pronator drift.   Movement examination: Tone: Normal tone in the bilateral upper extremities today.  Abnormal movements: There is intermittent UE resting tremor that is independent of one another and RLE resting tremor  Coordination:  There is mild decremation with RAM's, seen bilaterally but much more pronounced in the left hemibody, Including alternating supination and pronation of the forearm, hand opening and closing, finger taps, heel taps and toe taps.  The LE are worse than the UE Gait and Station: Iin her manual wheelchair.  Doesn't walk any more  Labs:  05/01/16 A1C 6.2 TSH 5.107    ASSESSMENT/PLAN:  1.  Parkinsons  -continue Stalevo to 125 mg at 7 AM/11 AM/3 PM/7pm, and carbidopa/levodopa 25/100 at 9 AM and 1 PM.  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    -  I. think she would benefit greatly from inpatient rehabilitation but has refused  -She will continue on amantadine 100 mg tid.  Having some minor hallucinations but pt doesn't wish to change this medication yet.  Will let me know if hallucinations increase or are not transient 2.  Excess EtOH use  -  She has greatly decreased her alcohol intake to 3 times per week.  I addressed this alcohol intake again with her today, as she hesitated a little bit when I asked her about the amount that she was taking in. 3.  Hypothyroidism  -TSH was on high side and told patient to f/u with PCP 4.  Depression.  -Has been the greatest barrier to good health. On Prozac.  Following with PCP.  Has not returned to therapist.  Her caregiver is going to be leaving her and I suspect that this will generate more depression.  Encouraged her to find new caregiver now. 5.  F/u in the next 6 months, sooner should new neurologic issues arise.  Much greater than 50% of this visit was spent in counseling with the patient and thecaregiver.  Total face to face time:  40 min

## 2016-07-04 ENCOUNTER — Encounter: Payer: Self-pay | Admitting: Neurology

## 2016-07-04 ENCOUNTER — Ambulatory Visit (INDEPENDENT_AMBULATORY_CARE_PROVIDER_SITE_OTHER): Payer: Medicare Other | Admitting: Neurology

## 2016-07-04 VITALS — BP 102/60 | HR 75

## 2016-07-04 DIAGNOSIS — G20 Parkinson's disease: Secondary | ICD-10-CM | POA: Diagnosis not present

## 2016-07-04 DIAGNOSIS — F331 Major depressive disorder, recurrent, moderate: Secondary | ICD-10-CM

## 2016-07-04 MED ORDER — CARBIDOPA-LEVODOPA 25-100 MG PO TABS: 1.0000 | ORAL_TABLET | Freq: Two times a day (BID) | ORAL | 1 refills | Status: DC

## 2016-07-04 NOTE — Patient Instructions (Signed)
Let me know if have hallucinations  Work on finding yourself a new caregiver  Call your primary care provider and ask about your thyroid  Follow up in 6 months

## 2016-10-20 ENCOUNTER — Other Ambulatory Visit: Payer: Self-pay | Admitting: Neurology

## 2017-01-01 ENCOUNTER — Ambulatory Visit: Payer: Medicare Other | Admitting: Neurology

## 2017-01-15 NOTE — Progress Notes (Signed)
Christy Christy Pham was seen today in the movement disorders clinic for neurologic follow up regarding parkinsonism.     This patient is accompanied in the office by her spouse  Christy Pham(Christy Pham) who supplements the history.    The last note that I have from [name] was May, 2013.  The patient was seen by [name] prior to Dr. Anne HahnWillis.  She has had symptoms ever since 2001.  The patient reports first sx was tremor in the R hand that was intermittent.  She is R hand dominant.  She now rarely has tremor in the L hand and has had that for about 2 years.  She believes that her first drug was requip.  She cannot remember the year and is unsure if the requip was or is helpful.  Several years later, she started on levodopa.  It was beneficial but not dramatic.  She was changed to stalevo in May 2013.  She is unsure if the change was helpful.    Currently, the patient is on Stalevo 100 mg 3 times a day.  She is on Requip 3 mg 3 times a day.  She was previously on Artane.  11/05/12  Last visit, I decreased her Requip to 2 mg 3 times a day because of hallucinations and LE edema.  She has had less hallucinations but states that she "sort of misses the people."   She states that the "people in the backyard have left."  She still has some visual hallucinations, but most of that is seeing faces in trees.   She is on Stalevo 100 tid.  She was to change the timing of her stalevo last visit but she is still taking it at 9:30am, 4:30pm and 9:30 pm and she awakens at 6:30 am and goes to bed at 10pm. She is still doing home PT and is working on transferring from the hover round to the manual wheelchair.  She doesn't want to go to the neurorehab center because she is emotionally attached to her therapists.  Overall the pt and her husband agree that she is doing better  02/11/13 update:  Last visit, I decreased the patient's Requip because of hallucinations.  She no longer has "visitors" in the backyard and "I kind of miss  those." Her Stalevo was increased to 100 mg 4 times per day.  Her husband feels that her medication is "erratic" and sometimes he notices when it helps and other times he is not so sure. I referred her to [name] last visit, and she reports that she has been to see him several times.  She is not sure if this helped, but her husband states that the patient "likes talking to him."  There is still significant difficulty with transfers.  She had 1 fall similar to her previous one where she slipped down between a chair.  This is the only fall that she is, but she does not walk much.  She relies completely on her husband to help transfer.  This has become somewhat more difficult.  She did get a scooter and he modified that to make it more comfortable, but she still has difficulty getting on and off.  02/17/13 update:  Pt presents today for on/off testing.  She has been off all med (was told to d/c levodopa but remain on other meds) for over 24 hours.  She feels more stiff and shaky since being off of the medication.  No falls.  Does not wish to consider  going to more intensive rehabilitation therapy at least until January.  She has had significant difficulty transferring.  No hallucinations.  06/22/13 update:  Pt presents today for f/u, accompanied by her husband who supplements the hx.  The pt states that she has generally been better.  Her mind has been much more clear.  The patient still states that she misses the people she is to see in the hallucinations.  She still does have some hallucinations, but her husband states that there nothing like they used to be.  She still has the physical therapist coming on.  Her husband she is much easier transfer, but she still cannot walk.  She does state that the tremor can be very bothersome.  She wonders if going up on the levodopa can help.  She is currently on Stalevo at 7 AM/11 AM/3 PM/7 PM and carbidopa/levodopa 25/100 at 9 AM and 1 PM.  She remains on ropirinole 2  mg, half tablet 3 times per day.  08/10/13 update:  Pt presents today for f/u, accompanied by her husband who supplements the history.  She is currently on Stalevo at 7 AM/11 AM/3 PM/7 PM and carbidopa/levodopa 25/100 at 9 AM and 1 PM.  Requip has been discontinued since last visit.  Her cognition is improved.  Hallucinations are resolved. Last visit, amantadine 100 mg tid was added.  Tremor is overall better but she has good and bad days.  She has refused both aqua therapy and in patient rehab, which I think would benefit her greatly.  She has refused physiatry consultation.  She is still having depression and is working this out with [name].  He told her that she needs to find a hobby.  She has no SI/HI.    12/11/13 update:  Pt presents today for f/u, accompanied by her husband who supplements the history.  She is currently on Stalevo at 7 AM/11 AM/3 PM/7 PM and carbidopa/levodopa 25/100 at 9 AM and 1 PM. She is also on amantadine 100 tid.  Since our last visit, I did have the opportunity to talk to one of her physical therapist that works with her at home.  Her physical therapist provided the history that there are times that she arrived at the home and the patient is drunk or has drank too much alcohol.  Feels that she does that to control depression.  Pts husband states that she had some cognitive issues last week but pt is insistent that she must have been falling asleep and was dreaming when he was talking to her.  He doesn't say anything further when she begins to disagree about cognitive changes.  Was to get a WC mobility eval at the neurorehab center; they state that the rehab center didn't call them but when the rehab center was contacted while they were in the office, the rehab center stated that they did contact the patient but they couldn't be reached.  Pt hasn't f/u with PCP since Jan  01/04/14 update:  Pt seen today, much sooner than anticipated.  Accompanied by husband who supplements the  history.   She is currently on Stalevo at 7 AM/11 AM/3 PM/7 PM and carbidopa/levodopa 25/100 at 9 AM and 1 PM. She is also on amantadine 100 tid.  She presents today for a mobility evaluation.  Currently, the patient uses a scooter, but the central drive shaft is in the way, and she cannot get her feet around it.  Her husband has to literally push her foot  around it, but the rigidity is a significant that he feels that he is going to cause damage to her foot when he does it.  He has had to modify her scooter because her feet do not otherwise lay on a normal scooter properly.  The patient cannot ambulate at all on her own with any type of ambulatory assistive device.  She cannot operate a manual wheelchair, as her arms are too weak and too tremulous.  She has a history of atrial fibrillation as well, which limits her ability from heart standpoint to maneuver around.  She does have the physical and mental ability to operate a power wheelchair safely.  Her home has been measured, and all doorways are able to accommodate a power wheelchair, with the exception of one bathroom, but she has other bathrooms in the home to accommodate it just fine.  04/16/14 update:  Patient is following up today, accompanied by her husband and a new caregiver who supplements the history.  Patient remains on Stalevo 100 at 7 AM/11 AM/3 PM/7 PM and an additional carbidopa/levodopa 25/100 at 9 AM and 1 PM.  She is also on amantadine 100 mg 3 times per day.  Despite the fact that we have been working for almost a year on trying to get her a power wheelchair, she still does not have it.  She is frustrated by this.  She does have a new caregiver and is doing more with her but cannot get out of the home much.  Awaiting on both the power WC and a new van.  No falls.  No hallucinations.  Pt decreased alcohol to 3 glasses wine per week.  09/17/14 update:  Pt is following up today, accompanied by her caregiver who supplements the history.   Patient  remains on Stalevo 100 at 7 AM/11 AM/3 PM/7 PM and is supposed to be on an additional carbidopa/levodopa 25/100 at 9 AM and 1 PM but she doesn't think that she is on that and doesn't know when/how that got dropped.  It was last RX in July and that was a 6 month supply.  She is also on amantadine 100 mg 3 times per day.   Tremor has definitely increased since last visit.  She finally was able to get her power WC.  The records that were made available to me were reviewed.  She was admitted to the hospital in Dec for UTI and a-fib with RVR, after she ran out of metoprolol.   She is still doing PT 2 days a week.  Her caregiver is there 6 days a week.  She is getting out more with her and with her conversion van.  She is therefore less depressed.    02/04/15 update:  The patient is following up today.  Last visit, I increased her Stalevo to 125 mg from 100 mg and she takes at 7 AM/11 AM/3 PM/7 PM.  She takes carbidopa/levodopa 25/100 at 9am and 1 pm as well.  She remains on amantadine 100 mg 3 times per day.  She continues to do therapy twice a week and continues to have an in-home caregiver.  When the caregiver is around, she is "bubbly" but she has depressive episodes when she is not there.  Her caregiver is now in nursing school and now only comes every other day instead of every day.  She denies falls.  Rare hallucinations of bugs on the floor and she knows that they arent real.  She no longer sees people.  12/09/15 update:  The patient is following up today, accompanied by her caregiver who supplements the history.  I have not seen the patient in 10 months, as she has not followed up.  She reports that she is still on Stalevo to 125 mg from 100 mg and she takes at 7 AM/11 AM/3 PM/7 PM.  She remains on amantadine 100 mg 3 times per day.  I did review records that are available on care everywhere.  She saw her PA on May 17 after a long hiatus there is.  She restarted her Prozac.  She refilled her Xanax for one time  per day.  I reviewed some of her lab work.  Her hemoglobin A1c was 6.3.  She is doing PT one time a week.  Otherwise, she is doing very little exercise and it sounds like she is not getting out of the home.  Reports that EtOH is sporadic.  Sometimes goes weeks without and then will have wine daily.    07/04/16 update:  Patient follows up today, accompanied by her caregiver who supplements the history.   She only has her caregiver for a few more weeks and she is upset about that.   She is on Stalevo 125 mg at 7 AM/11 AM/3 PM/7 PM and amantadine 100 mg 3 times per day.  She takes carbidopa/levodopa 25/100 bid at 9am/1pm. No falls.  Doing PT one time a week but otherwise no physical activity.  On prozac for depression.  Has occasional hallucination of bugs but "the people have left.  I sometimes miss them."  Blinking the eyes corrects the hallucination.    01/17/17 update:  Pt f/u today for PD, accompanied by her caregiver, who supplements the history.  Her caregiver is there twice per day, 7 days a week (from 8-10am, 5-7pm).  She helps with dressing, bathing, feeding. The records that were made available to me were reviewed since last visit.  Had medicare wellness examination in May.  On stalevo 125 mg at 7 AM/11 AM/3 PM/7pm, and carbidopa/levodopa 25/100 at 9 AM and 1 PM.  Was on amantadine 100 mg tid but had hallucination and went to bid and the hallucinations went away.  Her scooter now has a dead battery and states that medicare won't replace it. She cannot afford that.  No longer drinking EtOH  Wearing off:  No.  How long before next dose:  n/a Falls:   No. (but doesn't really ambulate) N/V:  No. Lightheaded:  yes  Syncope: No. Dyskinesia:  Yes.     PREVIOUS MEDICATIONS: Sinemet, Requip and artane  ALLERGIES:  No Known Allergies  CURRENT MEDICATIONS:  Current Outpatient Prescriptions on File Prior to Visit  Medication Sig Dispense Refill  . ALPRAZolam (XANAX) 0.25 MG tablet Take 0.25 mg by mouth  daily. FOR ANXIETY  5  . amantadine (SYMMETREL) 100 MG capsule TAKE 1 CAPSULE BY MOUTH THREE TIMES DAILY 270 capsule 3  . carbidopa-levodopa (SINEMET IR) 25-100 MG tablet Take 1 tablet by mouth 2 (two) times daily. NEEDS APPT FOR FURTHER REFILLS 180 tablet 1  . carbidopa-levodopa-entacapone (STALEVO) 31.25-125-200 MG tablet TAKE ONE TABLET BY MOUTH FOUR TIMES A DAY 120 tablet 5  . digoxin (LANOXIN) 0.25 MG tablet Take 0.25 mg by mouth daily.     Marland Kitchen FLUoxetine (PROZAC) 40 MG capsule Take 40 mg by mouth daily.    . metoprolol tartrate (LOPRESSOR) 25 MG tablet Take 0.5 tablets (12.5 mg total) by mouth 2 (two) times daily. 30 tablet 0  .  Multiple Vitamin (MULTIVITAMIN) tablet Take 1 tablet by mouth daily.    . Multiple Vitamins-Minerals (EYE VITAMINS PO) Take by mouth daily.    . simvastatin (ZOCOR) 40 MG tablet Take 40 mg by mouth every evening.    . warfarin (COUMADIN) 5 MG tablet Take 1 tablet (5 mg total) by mouth daily. Take 1 (5 mg) tablet by mouth daily. Follow with your PCP regarding dosing and INR monitoring.     No current facility-administered medications on file prior to visit.     PAST MEDICAL HISTORY:   Past Medical History:  Diagnosis Date  . Atrial fibrillation (HCC)   . Depression   . Hyperlipidemia    dx circa 2008  . Hypertension    dx circa 2008  . Osteoarthritis    "L knee primarily" (05/17/2014)  . Parkinson's disease    dx circa 1990 by Dr. Thad Ranger    PAST SURGICAL HISTORY:   Past Surgical History:  Procedure Laterality Date  . COLONOSCOPY     2006 by Jeani Hawking (clear per pt)  . TONSILLECTOMY  ~ 1950  . TUBAL LIGATION  ~ 1983    SOCIAL HISTORY:   Social History   Social History  . Marital status: Married    Spouse name: N/A  . Number of children: 1  . Years of education: 53   Occupational History  . 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   Social History Main Topics  . Smoking status:  Never Smoker  . Smokeless tobacco: Never Used  . Alcohol use 1.8 oz/week    3 Glasses of wine per week     Comment: 3 times per week (wine)  . Drug use: No  . Sexual activity: Not Currently   Other Topics Concern  . Not on file   Social History Narrative  . No narrative on file    FAMILY HISTORY:   Family Status  Relation Status  . Father Deceased at age 47       AAA  . Mother Deceased at age 57       "old age"  . Sister Alive       healthy  . Child Alive       1 son, healthy    ROS:  A complete 10 system review of systems was obtained and was unremarkable apart from what is mentioned above.  PHYSICAL EXAMINATION:      VITALS:   Vitals:   01/17/17 1425  BP: 122/60  Pulse: 75  SpO2: 94%    GEN:  The patient appears stated age and is in NAD. HEENT:  Normocephalic, atraumatic.  The mucous membranes are moist. The superficial temporal arteries are without ropiness or tenderness. CV:  Irregular. Lungs:  CTAB Neck/HEME:  There are no carotid bruits bilaterally.   Neurological examination:  Orientation:  Pt is alert and oriented x 3 Cranial nerves: There is good facial symmetry. The visual fields are full to confrontational testing. The speech is fluent and clear. Soft palate rises symmetrically and there is no tongue deviation. Hearing is intact to conversational tone. Sensation: Sensation is intact to light touch throughout Motor: Strength is 5/5 in the bilateral upper and lower extremities.   Shoulder shrug is equal and symmetric.  There is no pronator drift.   Movement examination: Tone: Normal tone in the bilateral upper extremities today.  Abnormal movements: There is bilateral UE resting tremor and LLE resting tremor  Coordination:  There is decremation with all form of RAMS, including alternating supination and pronation of the forearm, hand opening and closing, finger taps, heel taps and toe taps on the L.  She has trouble with with alternating supination  and pronation of the forearm heel taps and toe taps on right.  Gait and Station: Iin her manual wheelchair.  Doesn't walk any more  ASSESSMENT/PLAN:  1.  Parkinsons  -continue Stalevo 125 mg at 7 AM/11 AM/3 PM/7pm, and carbidopa/levodopa 25/100 at 9 AM and 1 PM.   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    -  I. think she would benefit greatly from inpatient rehabilitation but has refused  -continue amantadine 100 mg bid.  With higher dosages she had hallucinations.  Will continue to monitor.  -add OT to her PT 2.  Excess EtOH use  -  She reports that she is no longer drinking EtOH 3.  Depression.  -Has been the greatest barrier to good health. On Prozac.  Following with PCP.  Has not returned to therapist.  Her caregiver is going to be leaving her and I suspect that this will generate more depression.  Encouraged her to find new caregiver now. 4.  Follow up is anticipated in the next few months, sooner should new neurologic issues arise.  Much greater than 50% of this visit was spent in counseling and coordinating care.  Total face to face time:  25 min

## 2017-01-17 ENCOUNTER — Encounter: Payer: Self-pay | Admitting: Neurology

## 2017-01-17 ENCOUNTER — Ambulatory Visit (INDEPENDENT_AMBULATORY_CARE_PROVIDER_SITE_OTHER): Payer: Medicare Other | Admitting: Neurology

## 2017-01-17 VITALS — BP 122/60 | HR 75

## 2017-01-17 DIAGNOSIS — G20 Parkinson's disease: Secondary | ICD-10-CM

## 2017-01-17 DIAGNOSIS — F331 Major depressive disorder, recurrent, moderate: Secondary | ICD-10-CM

## 2017-01-18 ENCOUNTER — Telehealth: Payer: Self-pay | Admitting: Neurology

## 2017-01-18 NOTE — Addendum Note (Signed)
Addended bySilvio Pate on: 01/18/2017 08:59 AM   Modules accepted: Orders

## 2017-01-18 NOTE — Telephone Encounter (Addendum)
Referral sent to [facility] to add OT onto therapy to fax number [phone] with confirmation received.

## 2017-05-19 ENCOUNTER — Other Ambulatory Visit: Payer: Self-pay | Admitting: Neurology

## 2017-06-10 ENCOUNTER — Other Ambulatory Visit: Payer: Self-pay | Admitting: Neurology

## 2017-06-23 ENCOUNTER — Other Ambulatory Visit: Payer: Self-pay | Admitting: Neurology

## 2017-07-23 ENCOUNTER — Ambulatory Visit: Payer: Medicare Other | Admitting: Neurology

## 2017-07-25 NOTE — Progress Notes (Signed)
Christy Pham was seen today in the movement disorders clinic for neurologic follow up regarding parkinsonism.     This patient is accompanied in the office by her spouse  Christy Pham(Christy Pham) who supplements the history.    The last note that I have from Christy Pham was May, 2013.  The patient was [facility] prior to Christy. Anne Pham.  She has had symptoms ever since 2001.  The patient reports first sx was tremor in the R hand that was intermittent.  She is R hand dominant.  She now rarely has tremor in the L hand and has had that for about 2 years.  She believes that her first drug was requip.  She cannot remember the year and is unsure if the requip was or is helpful.  Several years later, she started on levodopa.  It was beneficial but not dramatic.  She was changed to stalevo in May 2013.  She is unsure if the change was helpful.    Currently, the patient is on Stalevo 100 mg 3 times a day.  She is on Requip 3 mg 3 times a day.  She was previously on Artane.  11/05/12  Last visit, I decreased her Requip to 2 mg 3 times a day because of hallucinations and LE edema.  She has had less hallucinations but states that she "sort of misses the people."   She states that the "people in the backyard have left."  She still has some visual hallucinations, but most of that is seeing faces in trees.   She is on Stalevo 100 tid.  She was to change the timing of her stalevo last visit but she is still taking it at 9:30am, 4:30pm and 9:30 pm and she awakens at 6:30 am and goes to bed at 10pm. She is still doing home PT and is working on transferring from the hover round to the manual wheelchair.  She doesn't want to go to the neurorehab center because she is emotionally attached to her therapists.  Overall the pt and her husband agree that she is doing better  02/11/13 update:  Last visit, I decreased the patient's Requip because of hallucinations.  She no longer has "visitors" in the backyard and "I kind of miss  those." Her Stalevo was increased to 100 mg 4 times per day.  Her husband feels that her medication is "erratic" and sometimes he notices when it helps and other times he is not so sure. I referred her to Christy. Dellia Pham last visit, and she reports that she has been to see him several times.  She is not sure if this helped, but her husband states that the patient "likes talking to him."  There is still significant difficulty with transfers.  She had 1 fall similar to her previous one where she slipped down between a chair.  This is the only fall that she is, but she does not walk much.  She relies completely on her husband to help transfer.  This has become somewhat more difficult.  She did get a scooter and he modified that to make it more comfortable, but she still has difficulty getting on and off.  02/17/13 update:  Pt presents today for on/off testing.  She has been off all med (was told to d/c levodopa but remain on other meds) for over 24 hours.  She feels more stiff and shaky since being off of the medication.  No falls.  Does not wish to consider  going to more intensive rehabilitation therapy at least until January.  She has had significant difficulty transferring.  No hallucinations.  06/22/13 update:  Pt presents today for f/u, accompanied by her husband who supplements the hx.  The pt states that she has generally been better.  Her mind has been much more clear.  The patient still states that she misses the people she is to see in the hallucinations.  She still does have some hallucinations, but her husband states that there nothing like they used to be.  She still has the physical therapist coming on.  Her husband she is much easier transfer, but she still cannot walk.  She does state that the tremor can be very bothersome.  She wonders if going up on the levodopa can help.  She is currently on Stalevo at 7 AM/11 AM/3 PM/7 PM and carbidopa/levodopa 25/100 at 9 AM and 1 PM.  She remains on ropirinole 2  mg, half tablet 3 times per day.  08/10/13 update:  Pt presents today for f/u, accompanied by her husband who supplements the history.  She is currently on Stalevo at 7 AM/11 AM/3 PM/7 PM and carbidopa/levodopa 25/100 at 9 AM and 1 PM.  Requip has been discontinued since last visit.  Her cognition is improved.  Hallucinations are resolved. Last visit, amantadine 100 mg tid was added.  Tremor is overall better but she has good and bad days.  She has refused both aqua therapy and in patient rehab, which I think would benefit her greatly.  She has refused physiatry consultation.  She is still having depression and is working this out with Christy. Dellia Pham.  He told her that she needs to find a hobby.  She has no SI/HI.    12/11/13 update:  Pt presents today for f/u, accompanied by her husband who supplements the history.  She is currently on Stalevo at 7 AM/11 AM/3 PM/7 PM and carbidopa/levodopa 25/100 at 9 AM and 1 PM. She is also on amantadine 100 tid.  Since our last visit, I did have the opportunity to talk to one of her physical therapist that works with her at home.  Her physical therapist provided the history that there are times that she arrived at the home and the patient is drunk or has drank too much alcohol.  Feels that she does that to control depression.  Pts husband states that she had some cognitive issues last week but pt is insistent that she must have been falling asleep and was dreaming when he was talking to her.  He doesn't say anything further when she begins to disagree about cognitive changes.  Was to get a WC mobility eval at the neurorehab center; they state that the rehab center didn't call them but when the rehab center was contacted while they were in the office, the rehab center stated that they did contact the patient but they couldn't be reached.  Pt hasn't f/u with PCP since Jan  01/04/14 update:  Pt seen today, much sooner than anticipated.  Accompanied by husband who supplements the  history.   She is currently on Stalevo at 7 AM/11 AM/3 PM/7 PM and carbidopa/levodopa 25/100 at 9 AM and 1 PM. She is also on amantadine 100 tid.  She presents today for a mobility evaluation.  Currently, the patient uses a scooter, but the central drive shaft is in the way, and she cannot get her feet around it.  Her husband has to literally push her foot  around it, but the rigidity is a significant that he feels that he is going to cause damage to her foot when he does it.  He has had to modify her scooter because her feet do not otherwise lay on a normal scooter properly.  The patient cannot ambulate at all on her own with any type of ambulatory assistive device.  She cannot operate a manual wheelchair, as her arms are too weak and too tremulous.  She has a history of atrial fibrillation as well, which limits her ability from heart standpoint to maneuver around.  She does have the physical and mental ability to operate a power wheelchair safely.  Her home has been measured, and all doorways are able to accommodate a power wheelchair, with the exception of one bathroom, but she has other bathrooms in the home to accommodate it just fine.  04/16/14 update:  Patient is following up today, accompanied by her husband and a new caregiver who supplements the history.  Patient remains on Stalevo 100 at 7 AM/11 AM/3 PM/7 PM and an additional carbidopa/levodopa 25/100 at 9 AM and 1 PM.  She is also on amantadine 100 mg 3 times per day.  Despite the fact that we have been working for almost a year on trying to get her a power wheelchair, she still does not have it.  She is frustrated by this.  She does have a new caregiver and is doing more with her but cannot get out of the home much.  Awaiting on both the power WC and a new van.  No falls.  No hallucinations.  Pt decreased alcohol to 3 glasses wine per week.  09/17/14 update:  Pt is following up today, accompanied by her caregiver who supplements the history.   Patient  remains on Stalevo 100 at 7 AM/11 AM/3 PM/7 PM and is supposed to be on an additional carbidopa/levodopa 25/100 at 9 AM and 1 PM but she doesn't think that she is on that and doesn't know when/how that got dropped.  It was last RX in July and that was a 6 month supply.  She is also on amantadine 100 mg 3 times per day.   Tremor has definitely increased since last visit.  She finally was able to get her power WC.  The records that were made available to me were reviewed.  She was admitted to the hospital in Dec for UTI and a-fib with RVR, after she ran out of metoprolol.   She is still doing PT 2 days a week.  Her caregiver is there 6 days a week.  She is getting out more with her and with her conversion van.  She is therefore less depressed.    02/04/15 update:  The patient is following up today.  Last visit, I increased her Stalevo to 125 mg from 100 mg and she takes at 7 AM/11 AM/3 PM/7 PM.  She takes carbidopa/levodopa 25/100 at 9am and 1 pm as well.  She remains on amantadine 100 mg 3 times per day.  She continues to do therapy twice a week and continues to have an in-home caregiver.  When the caregiver is around, she is "bubbly" but she has depressive episodes when she is not there.  Her caregiver is now in nursing school and now only comes every other day instead of every day.  She denies falls.  Rare hallucinations of bugs on the floor and she knows that they arent real.  She no longer sees people.  12/09/15 update:  The patient is following up today, accompanied by her caregiver who supplements the history.  I have not seen the patient in 10 months, as she has not followed up.  She reports that she is still on Stalevo to 125 mg from 100 mg and she takes at 7 AM/11 AM/3 PM/7 PM.  She remains on amantadine 100 mg 3 times per day.  I did review records that are available on care everywhere.  She saw her PA on May 17 after a long hiatus there is.  She restarted her Prozac.  She refilled her Xanax for one time  per day.  I reviewed some of her lab work.  Her hemoglobin A1c was 6.3.  She is doing PT one time a week.  Otherwise, she is doing very little exercise and it sounds like she is not getting out of the home.  Reports that EtOH is sporadic.  Sometimes goes weeks without and then will have wine daily.    07/04/16 update:  Patient follows up today, accompanied by her caregiver who supplements the history.   She only has her caregiver for a few more weeks and she is upset about that.   She is on Stalevo 125 mg at 7 AM/11 AM/3 PM/7 PM and amantadine 100 mg 3 times per day.  She takes carbidopa/levodopa 25/100 bid at 9am/1pm. No falls.  Doing PT one time a week but otherwise no physical activity.  On prozac for depression.  Has occasional hallucination of bugs but "the people have left.  I sometimes miss them."  Blinking the eyes corrects the hallucination.    01/17/17 update:  Pt f/u today for PD, accompanied by her caregiver, who supplements the history.  Her caregiver is there twice per day, 7 days a week (from 8-10am, 5-7pm).  She helps with dressing, bathing, feeding. The records that were made available to me were reviewed since last visit.  Had medicare wellness examination in May.  On stalevo 125 mg at 7 AM/11 AM/3 PM/7pm, and carbidopa/levodopa 25/100 at 9 AM and 1 PM.  Was on amantadine 100 mg tid but had hallucination and went to bid and the hallucinations went away.  Her scooter now has a dead battery and states that medicare won't replace it. She cannot afford that.  No longer drinking EtOH  Wearing off:  No.  How long before next dose:  n/a Falls:   No. (but doesn't really ambulate) N/V:  No. Lightheaded:  yes  Syncope: No. Dyskinesia:  Yes.     07/26/17 update: Patient seen today in follow-up for Parkinson's disease.  She is accompanied by her caregiver who supplements the history.  This is a new caregive over the last week.  She is on Stalevo 125 mg at 7 AM/11 AM/3 PM/7 PM.  She was on  carbidopa/levodopa 25/100 at 9 AM and 1 PM but reports that pharmacy wouldn't refill it as told that it has same ingredients as stalevo and has been off of it for many months.  She is on amantadine, 100 mg twice per day.  She has had one fall with a transfer 2 months ago.   She has daily caregiving service.  She has had rare hallucinations of aunts but if she blinks her eyes it will go away.  No lightheadedness or near syncope.  Records are reviewed.  She last saw her primary care provider on May 14, 2017.   PREVIOUS MEDICATIONS: Sinemet, Requip and artane  ALLERGIES:  No  Known Allergies  CURRENT MEDICATIONS:  Current Outpatient Medications on File Prior to Visit  Medication Sig Dispense Refill  . ALPRAZolam (XANAX) 0.25 MG tablet Take 0.25 mg by mouth daily. FOR ANXIETY  5  . amantadine (SYMMETREL) 100 MG capsule Take 1 capsule (100 mg total) by mouth 2 (two) times daily. 180 capsule 0  . carbidopa-levodopa-entacapone (STALEVO) 31.25-125-200 MG tablet TAKE ONE TABLET BY MOUTH FOUR TIMES A DAY 120 tablet 1  . digoxin (LANOXIN) 0.25 MG tablet Take 0.25 mg by mouth daily.     Marland Kitchen FLUoxetine (PROZAC) 40 MG capsule Take 40 mg by mouth daily.    . metFORMIN (GLUCOPHAGE-XR) 500 MG 24 hr tablet     . metoprolol tartrate (LOPRESSOR) 25 MG tablet Take 0.5 tablets (12.5 mg total) by mouth 2 (two) times daily. 30 tablet 0  . Multiple Vitamin (MULTIVITAMIN) tablet Take 1 tablet by mouth daily.    . Multiple Vitamins-Minerals (EYE VITAMINS PO) Take by mouth daily.    . simvastatin (ZOCOR) 40 MG tablet Take 40 mg by mouth every evening.    . warfarin (COUMADIN) 5 MG tablet Take 1 tablet (5 mg total) by mouth daily. Take 1 (5 mg) tablet by mouth daily. Follow with your PCP regarding dosing and INR monitoring.    . carbidopa-levodopa (SINEMET IR) 25-100 MG tablet Take 1 tablet by mouth 2 (two) times daily. NEEDS APPT FOR FURTHER REFILLS (Patient not taking: Reported on 07/26/2017) 180 tablet 1   No current  facility-administered medications on file prior to visit.     PAST MEDICAL HISTORY:   Past Medical History:  Diagnosis Date  . Atrial fibrillation (HCC)   . Depression   . Hyperlipidemia    dx circa 2008  . Hypertension    dx circa 2008  . Osteoarthritis    "L knee primarily" (05/17/2014)  . Parkinson's disease    dx circa 1990 by Christy. Thad Ranger    PAST SURGICAL HISTORY:   Past Surgical History:  Procedure Laterality Date  . COLONOSCOPY     2006 by Jeani Hawking (clear per pt)  . TONSILLECTOMY  ~ 1950  . TUBAL LIGATION  ~ 1983    SOCIAL HISTORY:   Social History   Socioeconomic History  . Marital status: Married    Spouse name: Not on file  . Number of children: 1  . Years of education: 28  . Highest education level: Not on file  Social Needs  . Financial resource strain: Not on file  . Food insecurity - worry: Not on file  . Food insecurity - inability: Not on file  . Transportation needs - medical: Not on file  . Transportation needs - non-medical: Not on file  Occupational History  . Occupation: Child psychotherapist, IT consultant, then [employer]     Comment: clerical at [employer]  . Smoking status: Never Smoker  . Smokeless tobacco: Never Used  Substance and Sexual Activity  . Alcohol use: Yes    Alcohol/week: 1.8 oz    Types: 3 Glasses of wine per week    Comment: 3 times per week (wine)  . Drug use: No  . Sexual activity: Not Currently  Other Topics Concern  . Not on file  Social History Narrative  . Not on file    FAMILY HISTORY:   Family Status  Relation Name Status  . Father  Deceased at age 60       AAA  . Mother  Deceased  at age 90       "old age"  . Sister Robbie Lis Alive       healthy  . Child  Alive       1 son, healthy    ROS:  A complete 10 system review of systems was obtained and was unremarkable apart from what is mentioned above.  PHYSICAL EXAMINATION:      VITALS:   Vitals:   07/26/17  0924  BP: 120/70  Pulse: (!) 54  SpO2: 94%    GEN:  The patient appears stated age and is in NAD. HEENT:  Normocephalic, atraumatic.  The mucous membranes are moist. The superficial temporal arteries are without ropiness or tenderness. CV:  Irregular.  bradycardic Lungs:  CTAB Neck/HEME:  There are no carotid bruits bilaterally.   Neurological examination:  Orientation:  Pt is alert and oriented x 3 Cranial nerves: There is good facial symmetry. The visual fields are full to confrontational testing. The speech is fluent and clear. Soft palate rises symmetrically and there is no tongue deviation. Hearing is intact to conversational tone. Sensation: Sensation is intact to light touch throughout Motor: Strength is 5/5 in the bilateral upper and lower extremities.   Shoulder shrug is equal and symmetric.  There is no pronator drift.   Movement examination: Tone: Normal tone in the bilateral upper extremities today. Slight increased tone in the right lower extremity Abnormal movements: There is bilateral UE resting tremor and LLE resting tremor Coordination:  There is decremation with all form of RAMS, including alternating supination and pronation of the forearm, hand opening and closing, finger taps, heel taps and toe taps on the L.  She has trouble with with alternating supination and pronation of the forearm heel taps and toe taps on right. Gait and Station: no longer ambulates.  In [facility]  Labs: Labs from primary care reviewed.  These were done in December, 2018.  White blood cells were 9.8, hemoglobin 14.3, hematocrit 93.8 and platelets 246.  Hemoglobin A1c was elevated at 8.8.  TSH was normal at 3.532.  Sodium was 139, potassium 4.2, chloride 101, CO2 23, BUN 12, creatinine 0.74 and glucose 167.  ASSESSMENT/PLAN:  1.  Parkinsons  -continue Stalevo 125 mg at 7 AM/11 AM/3 PM/7pm.  I RF her carbidopa/levodopa 25/100 at 9 AM and 1 PM and she will let me know if she has issues at the  pharmacy.   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    -  I think she would benefit greatly from inpatient rehabilitation but has refused.  Talked to her about ACT and drumming program and encouraged new caregiver to take her.  Pt education provided today  -continue amantadine 100 mg bid.  With higher dosages she had hallucinations.  Will continue to monitor. 2.  Excess EtOH use  -  She reports that she is no longer drinking EtOH and confirmed again about that today 3.  Depression.  -Has been the greatest barrier to good health. On Prozac.  Following with PCP.  Has not returned to therapist.  Relies on caregiving  4.  Follow up is anticipated in the next few months, sooner should new neurologic issues arise.  Much greater than 50% of this visit was spent in counseling and coordinating care.  Total face to face time:  25 min

## 2017-07-26 ENCOUNTER — Ambulatory Visit (INDEPENDENT_AMBULATORY_CARE_PROVIDER_SITE_OTHER): Payer: Medicare Other | Admitting: Neurology

## 2017-07-26 ENCOUNTER — Encounter: Payer: Self-pay | Admitting: Neurology

## 2017-07-26 VITALS — BP 120/70 | HR 54

## 2017-07-26 DIAGNOSIS — G20 Parkinson's disease: Secondary | ICD-10-CM

## 2017-07-26 DIAGNOSIS — F330 Major depressive disorder, recurrent, mild: Secondary | ICD-10-CM

## 2017-07-26 MED ORDER — CARBIDOPA-LEVODOPA 25-100 MG PO TABS: 1.0000 | ORAL_TABLET | Freq: Two times a day (BID) | ORAL | 1 refills | Status: DC

## 2017-08-09 ENCOUNTER — Other Ambulatory Visit: Payer: Self-pay | Admitting: Neurology

## 2017-10-08 ENCOUNTER — Other Ambulatory Visit: Payer: Self-pay | Admitting: Neurology

## 2017-10-08 MED ORDER — AMANTADINE HCL 100 MG PO CAPS: 100.0000 mg | ORAL_CAPSULE | Freq: Two times a day (BID) | ORAL | 1 refills | Status: DC

## 2018-01-24 NOTE — Progress Notes (Signed)
Elmer SowCarol B Niemczyk was seen today in the movement disorders clinic for neurologic follow up regarding parkinsonism.     This patient is accompanied in the office by her spouse  [name] who supplements the history.    The last note that I have from [name] was May, 2013.  The patient was [facility] by [name] prior to Dr. Anne HahnWillis.  She has had symptoms ever since 2001.  The patient reports first sx was tremor in the R hand that was intermittent.  She is R hand dominant.  She now rarely has tremor in the L hand and has had that for about 2 years.  She believes that her first drug was requip.  She cannot remember the year and is unsure if the requip was or is helpful.  Several years later, she started on levodopa.  It was beneficial but not dramatic.  She was changed to stalevo in May 2013.  She is unsure if the change was helpful.    Currently, the patient is on Stalevo 100 mg 3 times a day.  She is on Requip 3 mg 3 times a day.  She was previously on Artane.  11/05/12  Last visit, I decreased her Requip to 2 mg 3 times a day because of hallucinations and LE edema.  She has had less hallucinations but states that she "sort of misses the people."   She states that the "people in the backyard have left."  She still has some visual hallucinations, but most of that is seeing faces in trees.   She is on Stalevo 100 tid.  She was to change the timing of her stalevo last visit but she is still taking it at 9:30am, 4:30pm and 9:30 pm and she awakens at 6:30 am and goes to bed at 10pm. She is still doing home PT and is working on transferring from the hover round to the manual wheelchair.  She doesn't want to go to the neurorehab center because she is emotionally attached to her therapists.  Overall the pt and her husband agree that she is doing better  02/11/13 update:  Last visit, I decreased the patient's Requip because of hallucinations.  She no longer has "visitors" in the backyard and "I kind of miss  those." Her Stalevo was increased to 100 mg 4 times per day.  Her husband feels that her medication is "erratic" and sometimes he notices when it helps and other times he is not so sure. I referred her to [name] last visit, and she reports that she has been to see him several times.  She is not sure if this helped, but her husband states that the patient "likes talking to him."  There is still significant difficulty with transfers.  She had 1 fall similar to her previous one where she slipped down between a chair.  This is the only fall that she is, but she does not walk much.  She relies completely on her husband to help transfer.  This has become somewhat more difficult.  She did get a scooter and he modified that to make it more comfortable, but she still has difficulty getting on and off.  02/17/13 update:  Pt presents today for on/off testing.  She has been off all med (was told to d/c levodopa but remain on other meds) for over 24 hours.  She feels more stiff and shaky since being off of the medication.  No falls.  Does not wish to consider  going to more intensive rehabilitation therapy at least until January.  She has had significant difficulty transferring.  No hallucinations.  06/22/13 update:  Pt presents today for f/u, accompanied by her husband who supplements the hx.  The pt states that she has generally been better.  Her mind has been much more clear.  The patient still states that she misses the people she is to see in the hallucinations.  She still does have some hallucinations, but her husband states that there nothing like they used to be.  She still has the physical therapist coming on.  Her husband she is much easier transfer, but she still cannot walk.  She does state that the tremor can be very bothersome.  She wonders if going up on the levodopa can help.  She is currently on Stalevo at 7 AM/11 AM/3 PM/7 PM and carbidopa/levodopa 25/100 at 9 AM and 1 PM.  She remains on ropirinole 2  mg, half tablet 3 times per day.  08/10/13 update:  Pt presents today for f/u, accompanied by her husband who supplements the history.  She is currently on Stalevo at 7 AM/11 AM/3 PM/7 PM and carbidopa/levodopa 25/100 at 9 AM and 1 PM.  Requip has been discontinued since last visit.  Her cognition is improved.  Hallucinations are resolved. Last visit, amantadine 100 mg tid was added.  Tremor is overall better but she has good and bad days.  She has refused both aqua therapy and in patient rehab, which I think would benefit her greatly.  She has refused physiatry consultation.  She is still having depression and is working this out with [name].  He told her that she needs to find a hobby.  She has no SI/HI.    12/11/13 update:  Pt presents today for f/u, accompanied by her husband who supplements the history.  She is currently on Stalevo at 7 AM/11 AM/3 PM/7 PM and carbidopa/levodopa 25/100 at 9 AM and 1 PM. She is also on amantadine 100 tid.  Since our last visit, I did have the opportunity to talk to one of her physical therapist that works with her at home.  Her physical therapist provided the history that there are times that she arrived at the home and the patient is drunk or has drank too much alcohol.  Feels that she does that to control depression.  Pts husband states that she had some cognitive issues last week but pt is insistent that she must have been falling asleep and was dreaming when he was talking to her.  He doesn't say anything further when she begins to disagree about cognitive changes.  Was to get a WC mobility eval at the neurorehab center; they state that the rehab center didn't call them but when the rehab center was contacted while they were in the office, the rehab center stated that they did contact the patient but they couldn't be reached.  Pt hasn't f/u with PCP since Jan  01/04/14 update:  Pt seen today, much sooner than anticipated.  Accompanied by husband who supplements the  history.   She is currently on Stalevo at 7 AM/11 AM/3 PM/7 PM and carbidopa/levodopa 25/100 at 9 AM and 1 PM. She is also on amantadine 100 tid.  She presents today for a mobility evaluation.  Currently, the patient uses a scooter, but the central drive shaft is in the way, and she cannot get her feet around it.  Her husband has to literally push her foot  around it, but the rigidity is a significant that he feels that he is going to cause damage to her foot when he does it.  He has had to modify her scooter because her feet do not otherwise lay on a normal scooter properly.  The patient cannot ambulate at all on her own with any type of ambulatory assistive device.  She cannot operate a manual wheelchair, as her arms are too weak and too tremulous.  She has a history of atrial fibrillation as well, which limits her ability from heart standpoint to maneuver around.  She does have the physical and mental ability to operate a power wheelchair safely.  Her home has been measured, and all doorways are able to accommodate a power wheelchair, with the exception of one bathroom, but she has other bathrooms in the home to accommodate it just fine.  04/16/14 update:  Patient is following up today, accompanied by her husband and a new caregiver who supplements the history.  Patient remains on Stalevo 100 at 7 AM/11 AM/3 PM/7 PM and an additional carbidopa/levodopa 25/100 at 9 AM and 1 PM.  She is also on amantadine 100 mg 3 times per day.  Despite the fact that we have been working for almost a year on trying to get her a power wheelchair, she still does not have it.  She is frustrated by this.  She does have a new caregiver and is doing more with her but cannot get out of the home much.  Awaiting on both the power WC and a new van.  No falls.  No hallucinations.  Pt decreased alcohol to 3 glasses wine per week.  09/17/14 update:  Pt is following up today, accompanied by her caregiver who supplements the history.   Patient  remains on Stalevo 100 at 7 AM/11 AM/3 PM/7 PM and is supposed to be on an additional carbidopa/levodopa 25/100 at 9 AM and 1 PM but she doesn't think that she is on that and doesn't know when/how that got dropped.  It was last RX in July and that was a 6 month supply.  She is also on amantadine 100 mg 3 times per day.   Tremor has definitely increased since last visit.  She finally was able to get her power WC.  The records that were made available to me were reviewed.  She was admitted to the hospital in Dec for UTI and a-fib with RVR, after she ran out of metoprolol.   She is still doing PT 2 days a week.  Her caregiver is there 6 days a week.  She is getting out more with her and with her conversion van.  She is therefore less depressed.    02/04/15 update:  The patient is following up today.  Last visit, I increased her Stalevo to 125 mg from 100 mg and she takes at 7 AM/11 AM/3 PM/7 PM.  She takes carbidopa/levodopa 25/100 at 9am and 1 pm as well.  She remains on amantadine 100 mg 3 times per day.  She continues to do therapy twice a week and continues to have an in-home caregiver.  When the caregiver is around, she is "bubbly" but she has depressive episodes when she is not there.  Her caregiver is now in nursing school and now only comes every other day instead of every day.  She denies falls.  Rare hallucinations of bugs on the floor and she knows that they arent real.  She no longer sees people.  12/09/15 update:  The patient is following up today, accompanied by her caregiver who supplements the history.  I have not seen the patient in 10 months, as she has not followed up.  She reports that she is still on Stalevo to 125 mg from 100 mg and she takes at 7 AM/11 AM/3 PM/7 PM.  She remains on amantadine 100 mg 3 times per day.  I did review records that are available on care everywhere.  She saw her PA on May 17 after a long hiatus there is.  She restarted her Prozac.  She refilled her Xanax for one time  per day.  I reviewed some of her lab work.  Her hemoglobin A1c was 6.3.  She is doing PT one time a week.  Otherwise, she is doing very little exercise and it sounds like she is not getting out of the home.  Reports that EtOH is sporadic.  Sometimes goes weeks without and then will have wine daily.    07/04/16 update:  Patient follows up today, accompanied by her caregiver who supplements the history.   She only has her caregiver for a few more weeks and she is upset about that.   She is on Stalevo 125 mg at 7 AM/11 AM/3 PM/7 PM and amantadine 100 mg 3 times per day.  She takes carbidopa/levodopa 25/100 bid at 9am/1pm. No falls.  Doing PT one time a week but otherwise no physical activity.  On prozac for depression.  Has occasional hallucination of bugs but "the people have left.  I sometimes miss them."  Blinking the eyes corrects the hallucination.    01/17/17 update:  Pt f/u today for PD, accompanied by her caregiver, who supplements the history.  Her caregiver is there twice per day, 7 days a week (from 8-10am, 5-7pm).  She helps with dressing, bathing, feeding. The records that were made available to me were reviewed since last visit.  Had medicare wellness examination in May.  On stalevo 125 mg at 7 AM/11 AM/3 PM/7pm, and carbidopa/levodopa 25/100 at 9 AM and 1 PM.  Was on amantadine 100 mg tid but had hallucination and went to bid and the hallucinations went away.  Her scooter now has a dead battery and states that medicare won't replace it. She cannot afford that.  No longer drinking EtOH  Wearing off:  No.  How long before next dose:  n/a Falls:   No. (but doesn't really ambulate) N/V:  No. Lightheaded:  yes  Syncope: No. Dyskinesia:  Yes.     07/26/17 update: Patient seen today in follow-up for Parkinson's disease.  She is accompanied by her caregiver who supplements the history.  This is a new caregive over the last week.  She is on Stalevo 125 mg at 7 AM/11 AM/3 PM/7 PM.  She was on  carbidopa/levodopa 25/100 at 9 AM and 1 PM but reports that pharmacy wouldn't refill it as told that it has same ingredients as stalevo and has been off of it for many months.  She is on amantadine, 100 mg twice per day.  She has had one fall with a transfer 2 months ago.   She has daily caregiving service.  She has had rare hallucinations of aunts but if she blinks her eyes it will go away.  No lightheadedness or near syncope.  Records are reviewed.  She last saw her primary care provider on May 14, 2017.  01/28/18 update: Patient is seen today in follow-up for Parkinson's disease.  She is accompanied by her caregiver (tiffany) who supplements the history (this caregiver has been present for 4 months).  Caregiver says that she is there 3 hours in the AM and 3 in the evening.    She is on Stalevo 125 mg, 1 tablet at 7 AM/11 AM/3 PM/7 PM and will take carbidopa/levodopa 25/100 immediate release 9 AM and 1 PM.  She has changed her medication Stavelo 125 mg, 2 in the AM, 1 in the afternoon and 1 in the evening.   She is still taking the carbidopa/levodopa IR.  She is on amantadine, 100 mg twice per day.  She denies hallucinations.  She denies sleep attacks.  She denies falls.  Denies confusion. Caregiver states that mood has been good.  She wears undergarments for bladder.   1 fall.  Slipped out of the chair when reaching for something and hit eye on chair.  Called EMS and they helped her up.  Caregiver states that some days transfers are easy and other days more troublesome.     PREVIOUS MEDICATIONS: Sinemet, Requip and artane  ALLERGIES:  No Known Allergies  CURRENT MEDICATIONS:  Current Outpatient Medications on File Prior to Visit  Medication Sig Dispense Refill  . ALPRAZolam (XANAX) 0.25 MG tablet Take 0.25 mg by mouth daily. FOR ANXIETY  5  . amantadine (SYMMETREL) 100 MG capsule Take 1 capsule (100 mg total) by mouth 2 (two) times daily. 180 capsule 1  . carbidopa-levodopa (SINEMET IR) 25-100 MG  tablet Take 1 tablet by mouth 2 (two) times daily. 180 tablet 1  . carbidopa-levodopa-entacapone (STALEVO) 31.25-125-200 MG tablet TAKE ONE TABLET BY MOUTH FOUR TIMES A DAY    -- MUST KEEPT APPOINTMENT FOR ADDITIONAL REFILLS 120 tablet 5  . digoxin (LANOXIN) 0.25 MG tablet Take 0.25 mg by mouth daily.     Marland Kitchen FLUoxetine (PROZAC) 40 MG capsule Take 40 mg by mouth daily.    . metFORMIN (GLUCOPHAGE-XR) 500 MG 24 hr tablet     . metoprolol tartrate (LOPRESSOR) 25 MG tablet Take 0.5 tablets (12.5 mg total) by mouth 2 (two) times daily. 30 tablet 0  . Multiple Vitamin (MULTIVITAMIN) tablet Take 1 tablet by mouth daily.    . Multiple Vitamins-Minerals (EYE VITAMINS PO) Take by mouth daily.    . simvastatin (ZOCOR) 40 MG tablet Take 40 mg by mouth every evening.    . warfarin (COUMADIN) 5 MG tablet Take 1 tablet (5 mg total) by mouth daily. Take 1 (5 mg) tablet by mouth daily. Follow with your PCP regarding dosing and INR monitoring.     No current facility-administered medications on file prior to visit.     PAST MEDICAL HISTORY:   Past Medical History:  Diagnosis Date  . Atrial fibrillation (HCC)   . Depression   . Hyperlipidemia    dx circa 2008  . Hypertension    dx circa 2008  . Osteoarthritis    "L knee primarily" (05/17/2014)  . Parkinson's disease    dx circa 1990 by Dr. Thad Ranger    PAST SURGICAL HISTORY:   Past Surgical History:  Procedure Laterality Date  . COLONOSCOPY     2006 by Jeani Hawking (clear per pt)  . TONSILLECTOMY  ~ 1950  . TUBAL LIGATION  ~ 1983    SOCIAL HISTORY:   Social History   Socioeconomic History  . Marital status: Married    Spouse name: Not on file  . Number of children: 1  . Years of education: 74  .  Highest education level: Not on file  Occupational History  . Occupation: Child psychotherapist, IT consultant, then [employer]     Comment: clerical at [employer]  . Financial resource strain: Not on file  . Food  insecurity:    Worry: Not on file    Inability: Not on file  . Transportation needs:    Medical: Not on file    Non-medical: Not on file  Tobacco Use  . Smoking status: Never Smoker  . Smokeless tobacco: Never Used  Substance and Sexual Activity  . Alcohol use: Yes    Alcohol/week: 3.0 standard drinks    Types: 3 Glasses of wine per week    Comment: 3 times per week (wine)  . Drug use: No  . Sexual activity: Not Currently  Lifestyle  . Physical activity:    Days per week: Not on file    Minutes per session: Not on file  . Stress: Not on file  Relationships  . Social connections:    Talks on phone: Not on file    Gets together: Not on file    Attends religious service: Not on file    Active member of club or organization: Not on file    Attends meetings of clubs or organizations: Not on file    Relationship status: Not on file  . Intimate partner violence:    Fear of current or ex partner: Not on file    Emotionally abused: Not on file    Physically abused: Not on file    Forced sexual activity: Not on file  Other Topics Concern  . Not on file  Social History Narrative  . Not on file    FAMILY HISTORY:   Family Status  Relation Name Status  . Father  Deceased at age 78       AAA  . Mother  Deceased at age 56       "old age"  . Sister Robbie Lis Alive       healthy  . Child  Alive       1 son, healthy    ROS:  A complete 10 system review of systems was obtained and was unremarkable apart from what is mentioned above.  PHYSICAL EXAMINATION:      VITALS:   Vitals:   01/28/18 1049  BP: 114/66  Pulse: 64  SpO2: 93%    GEN:  The patient appears stated age and is in NAD. HEENT:  Normocephalic, atraumatic.  The mucous membranes are moist. The superficial temporal arteries are without ropiness or tenderness. CV:  Irreg Lungs:  CTAB Neck/HEME:  There are no carotid bruits bilaterally.   Neurological examination:  Orientation:  Pt is alert and oriented  x 3 Cranial nerves: There is good facial symmetry. The visual fields are full to confrontational testing. The speech is fluent and clear. Soft palate rises symmetrically and there is no tongue deviation. Hearing is intact to conversational tone. Sensation: Sensation is intact to light touch throughout Motor: Strength is 5/5 in the bilateral upper and lower extremities.   Shoulder shrug is equal and symmetric.  There is no pronator drift.   Movement examination: Tone:  Mild increased tone in the LUE Abnormal movements: There is bilateral UE resting tremor and LLE resting tremor Coordination:  There is decremation with all form of RAMS, including alternating supination and pronation of the forearm, hand opening and closing, finger taps, heel taps and toe taps on the L.  She has trouble with with alternating supination and pronation of the forearm heel taps and toe taps on right. Gait and Station: no longer ambulates.  In [facility]  Labs: Lab work is reviewed.  Hemoglobin A1c was 6.5 and July,, 2019 which is markedly improved.  Sodium was 140, potassium 4.0, chloride 102, CO2 28, BUN 15 and creatinine 0.65.  White blood cells were 10.2, hemoglobin 13.9, hematocrit 42.1 and platelets 239.  ASSESSMENT/PLAN:  1.  Parkinsons  -change Stalevo 125 mg to 2 at 7am/1 at 11am/2 at 4 pm  -d/c IR carbidopa/levodopa 25/100.    -told pt not to change meds without talking to me  -continue amantadine 100 mg bid.  Higher dosages caused hallucinations  -requip has been d/c due to hallucinations and cognitive change.    -encouraged exercise 2.  Excess EtOH use  -  She reports that she is no longer drinking EtOH and confirmed again about that today (01/28/18)  3.  Depression.  -Has been the greatest barrier to good health. Discussed this with her new caregiver today.  On Prozac.  Following with PCP.  Has not returned to therapist.  Relies on caregiving  4.  Follow up is anticipated in the next 6 months, sooner should  new neurologic issues arise.

## 2018-01-28 ENCOUNTER — Encounter: Payer: Self-pay | Admitting: Neurology

## 2018-01-28 ENCOUNTER — Ambulatory Visit (INDEPENDENT_AMBULATORY_CARE_PROVIDER_SITE_OTHER): Payer: Medicare Other | Admitting: Neurology

## 2018-01-28 VITALS — BP 114/66 | HR 64

## 2018-01-28 DIAGNOSIS — F330 Major depressive disorder, recurrent, mild: Secondary | ICD-10-CM

## 2018-01-28 DIAGNOSIS — G20 Parkinson's disease: Secondary | ICD-10-CM

## 2018-01-28 NOTE — Patient Instructions (Signed)
Increase stalevo 125 mg to 2 tablets at 7am, 1 at 11am, 2 at 4pm.    Stop carbidopa/levodopa IR (this will be a yellow pill)  I want to see you exercise!

## 2018-02-13 ENCOUNTER — Other Ambulatory Visit: Payer: Self-pay | Admitting: Neurology

## 2018-04-14 ENCOUNTER — Other Ambulatory Visit: Payer: Self-pay | Admitting: Neurology

## 2018-07-31 NOTE — Progress Notes (Deleted)
Christy Pham was seen today in the movement disorders clinic for neurologic follow up regarding parkinsonism.     This patient is accompanied in the office by her spouse  Christy Pham([name]) who supplements the history.    The last note that I have from [name] was May, 2013.  The patient was [facility] by [name] prior to Dr. Anne HahnWillis.  She has had symptoms ever since 2001.  The patient reports first sx was tremor in the R hand that was intermittent.  She is R hand dominant.  She now rarely has tremor in the L hand and has had that for about 2 years.  She believes that her first drug was requip.  She cannot remember the year and is unsure if the requip was or is helpful.  Several years later, she started on levodopa.  It was beneficial but not dramatic.  She was changed to stalevo in May 2013.  She is unsure if the change was helpful.    Currently, the patient is on Stalevo 100 mg 3 times a day.  She is on Requip 3 mg 3 times a day.  She was previously on Artane.  11/05/12  Last visit, I decreased her Requip to 2 mg 3 times a day because of hallucinations and LE edema.  She has had less hallucinations but states that she "sort of misses the people."   She states that the "people in the backyard have left."  She still has some visual hallucinations, but most of that is seeing faces in trees.   She is on Stalevo 100 tid.  She was to change the timing of her stalevo last visit but she is still taking it at 9:30am, 4:30pm and 9:30 pm and she awakens at 6:30 am and goes to bed at 10pm. She is still doing home PT and is working on transferring from the hover round to the manual wheelchair.  She doesn't want to go to the neurorehab center because she is emotionally attached to her therapists.  Overall the pt and her husband agree that she is doing better  02/11/13 update:  Last visit, I decreased the patient's Requip because of hallucinations.  She no longer has "visitors" in the backyard and "I kind of miss  those." Her Stalevo was increased to 100 mg 4 times per day.  Her husband feels that her medication is "erratic" and sometimes he notices when it helps and other times he is not so sure. I referred her to [name] last visit, and she reports that she has been to see him several times.  She is not sure if this helped, but her husband states that the patient "likes talking to him."  There is still significant difficulty with transfers.  She had 1 fall similar to her previous one where she slipped down between a chair.  This is the only fall that she is, but she does not walk much.  She relies completely on her husband to help transfer.  This has become somewhat more difficult.  She did get a scooter and he modified that to make it more comfortable, but she still has difficulty getting on and off.  02/17/13 update:  Pt presents today for on/off testing.  She has been off all med (was told to d/c levodopa but remain on other meds) for over 24 hours.  She feels more stiff and shaky since being off of the medication.  No falls.  Does not wish to consider  going to more intensive rehabilitation therapy at least until January.  She has had significant difficulty transferring.  No hallucinations.  06/22/13 update:  Pt presents today for f/u, accompanied by her husband who supplements the hx.  The pt states that she has generally been better.  Her mind has been much more clear.  The patient still states that she misses the people she is to see in the hallucinations.  She still does have some hallucinations, but her husband states that there nothing like they used to be.  She still has the physical therapist coming on.  Her husband she is much easier transfer, but she still cannot walk.  She does state that the tremor can be very bothersome.  She wonders if going up on the levodopa can help.  She is currently on Stalevo at 7 AM/11 AM/3 PM/7 PM and carbidopa/levodopa 25/100 at 9 AM and 1 PM.  She remains on ropirinole 2  mg, half tablet 3 times per day.  08/10/13 update:  Pt presents today for f/u, accompanied by her husband who supplements the history.  She is currently on Stalevo at 7 AM/11 AM/3 PM/7 PM and carbidopa/levodopa 25/100 at 9 AM and 1 PM.  Requip has been discontinued since last visit.  Her cognition is improved.  Hallucinations are resolved. Last visit, amantadine 100 mg tid was added.  Tremor is overall better but she has good and bad days.  She has refused both aqua therapy and in patient rehab, which I think would benefit her greatly.  She has refused physiatry consultation.  She is still having depression and is working this out with [name].  He told her that she needs to find a hobby.  She has no SI/HI.    12/11/13 update:  Pt presents today for f/u, accompanied by her husband who supplements the history.  She is currently on Stalevo at 7 AM/11 AM/3 PM/7 PM and carbidopa/levodopa 25/100 at 9 AM and 1 PM. She is also on amantadine 100 tid.  Since our last visit, I did have the opportunity to talk to one of her physical therapist that works with her at home.  Her physical therapist provided the history that there are times that she arrived at the home and the patient is drunk or has drank too much alcohol.  Feels that she does that to control depression.  Pts husband states that she had some cognitive issues last week but pt is insistent that she must have been falling asleep and was dreaming when he was talking to her.  He doesn't say anything further when she begins to disagree about cognitive changes.  Was to get a WC mobility eval at the neurorehab center; they state that the rehab center didn't call them but when the rehab center was contacted while they were in the office, the rehab center stated that they did contact the patient but they couldn't be reached.  Pt hasn't f/u with PCP since Jan  01/04/14 update:  Pt seen today, much sooner than anticipated.  Accompanied by husband who supplements the  history.   She is currently on Stalevo at 7 AM/11 AM/3 PM/7 PM and carbidopa/levodopa 25/100 at 9 AM and 1 PM. She is also on amantadine 100 tid.  She presents today for a mobility evaluation.  Currently, the patient uses a scooter, but the central drive shaft is in the way, and she cannot get her feet around it.  Her husband has to literally push her foot  around it, but the rigidity is a significant that he feels that he is going to cause damage to her foot when he does it.  He has had to modify her scooter because her feet do not otherwise lay on a normal scooter properly.  The patient cannot ambulate at all on her own with any type of ambulatory assistive device.  She cannot operate a manual wheelchair, as her arms are too weak and too tremulous.  She has a history of atrial fibrillation as well, which limits her ability from heart standpoint to maneuver around.  She does have the physical and mental ability to operate a power wheelchair safely.  Her home has been measured, and all doorways are able to accommodate a power wheelchair, with the exception of one bathroom, but she has other bathrooms in the home to accommodate it just fine.  04/16/14 update:  Patient is following up today, accompanied by her husband and a new caregiver who supplements the history.  Patient remains on Stalevo 100 at 7 AM/11 AM/3 PM/7 PM and an additional carbidopa/levodopa 25/100 at 9 AM and 1 PM.  She is also on amantadine 100 mg 3 times per day.  Despite the fact that we have been working for almost a year on trying to get her a power wheelchair, she still does not have it.  She is frustrated by this.  She does have a new caregiver and is doing more with her but cannot get out of the home much.  Awaiting on both the power WC and a new van.  No falls.  No hallucinations.  Pt decreased alcohol to 3 glasses wine per week.  09/17/14 update:  Pt is following up today, accompanied by her caregiver who supplements the history.   Patient  remains on Stalevo 100 at 7 AM/11 AM/3 PM/7 PM and is supposed to be on an additional carbidopa/levodopa 25/100 at 9 AM and 1 PM but she doesn't think that she is on that and doesn't know when/how that got dropped.  It was last RX in July and that was a 6 month supply.  She is also on amantadine 100 mg 3 times per day.   Tremor has definitely increased since last visit.  She finally was able to get her power WC.  The records that were made available to me were reviewed.  She was admitted to the hospital in Dec for UTI and a-fib with RVR, after she ran out of metoprolol.   She is still doing PT 2 days a week.  Her caregiver is there 6 days a week.  She is getting out more with her and with her conversion van.  She is therefore less depressed.    02/04/15 update:  The patient is following up today.  Last visit, I increased her Stalevo to 125 mg from 100 mg and she takes at 7 AM/11 AM/3 PM/7 PM.  She takes carbidopa/levodopa 25/100 at 9am and 1 pm as well.  She remains on amantadine 100 mg 3 times per day.  She continues to do therapy twice a week and continues to have an in-home caregiver.  When the caregiver is around, she is "bubbly" but she has depressive episodes when she is not there.  Her caregiver is now in nursing school and now only comes every other day instead of every day.  She denies falls.  Rare hallucinations of bugs on the floor and she knows that they arent real.  She no longer sees people.  12/09/15 update:  The patient is following up today, accompanied by her caregiver who supplements the history.  I have not seen the patient in 10 months, as she has not followed up.  She reports that she is still on Stalevo to 125 mg from 100 mg and she takes at 7 AM/11 AM/3 PM/7 PM.  She remains on amantadine 100 mg 3 times per day.  I did review records that are available on care everywhere.  She saw her PA on May 17 after a long hiatus there is.  She restarted her Prozac.  She refilled her Xanax for one time  per day.  I reviewed some of her lab work.  Her hemoglobin A1c was 6.3.  She is doing PT one time a week.  Otherwise, she is doing very little exercise and it sounds like she is not getting out of the home.  Reports that EtOH is sporadic.  Sometimes goes weeks without and then will have wine daily.    07/04/16 update:  Patient follows up today, accompanied by her caregiver who supplements the history.   She only has her caregiver for a few more weeks and she is upset about that.   She is on Stalevo 125 mg at 7 AM/11 AM/3 PM/7 PM and amantadine 100 mg 3 times per day.  She takes carbidopa/levodopa 25/100 bid at 9am/1pm. No falls.  Doing PT one time a week but otherwise no physical activity.  On prozac for depression.  Has occasional hallucination of bugs but "the people have left.  I sometimes miss them."  Blinking the eyes corrects the hallucination.    01/17/17 update:  Pt f/u today for PD, accompanied by her caregiver, who supplements the history.  Her caregiver is there twice per day, 7 days a week (from 8-10am, 5-7pm).  She helps with dressing, bathing, feeding. The records that were made available to me were reviewed since last visit.  Had medicare wellness examination in May.  On stalevo 125 mg at 7 AM/11 AM/3 PM/7pm, and carbidopa/levodopa 25/100 at 9 AM and 1 PM.  Was on amantadine 100 mg tid but had hallucination and went to bid and the hallucinations went away.  Her scooter now has a dead battery and states that medicare won't replace it. She cannot afford that.  No longer drinking EtOH  Wearing off:  No.  How long before next dose:  n/a Falls:   No. (but doesn't really ambulate) N/V:  No. Lightheaded:  yes  Syncope: No. Dyskinesia:  Yes.     07/26/17 update: Patient seen today in follow-up for Parkinson's disease.  She is accompanied by her caregiver who supplements the history.  This is a new caregive over the last week.  She is on Stalevo 125 mg at 7 AM/11 AM/3 PM/7 PM.  She was on  carbidopa/levodopa 25/100 at 9 AM and 1 PM but reports that pharmacy wouldn't refill it as told that it has same ingredients as stalevo and has been off of it for many months.  She is on amantadine, 100 mg twice per day.  She has had one fall with a transfer 2 months ago.   She has daily caregiving service.  She has had rare hallucinations of aunts but if she blinks her eyes it will go away.  No lightheadedness or near syncope.  Records are reviewed.  She last saw her primary care provider on May 14, 2017.  01/28/18 update: Patient is seen today in follow-up for Parkinson's disease.  She is accompanied by her caregiver (tiffany) who supplements the history (this caregiver has been present for 4 months).  Caregiver says that she is there 3 hours in the AM and 3 in the evening.    She is on Stalevo 125 mg, 1 tablet at 7 AM/11 AM/3 PM/7 PM and will take carbidopa/levodopa 25/100 immediate release 9 AM and 1 PM.  She has changed her medication Stavelo 125 mg, 2 in the AM, 1 in the afternoon and 1 in the evening.   She is still taking the carbidopa/levodopa IR.  She is on amantadine, 100 mg twice per day.  She denies hallucinations.  She denies sleep attacks.  She denies falls.  Denies confusion. Caregiver states that mood has been good.  She wears undergarments for bladder.   1 fall.  Slipped out of the chair when reaching for something and hit eye on chair.  Called EMS and they helped her up.  Caregiver states that some days transfers are easy and other days more troublesome.    08/01/18 update: Patient is seen today in follow-up for Parkinson's disease, accompanied by her caregiver who supplements history.  Last visit, I changed her medication so that she is now on Stalevo 125 mg, 2 tablets at 7 AM, 1 tablet at 11 AM, 2 tablets at 4 PM.  Records are reviewed since last visit.  She saw her primary care physician assistant on July 10, 2018.  Her Crestor was increased.  She was referred to cardiology, although  she has not yet been there.  The appointment is on August 11, 2018.  PREVIOUS MEDICATIONS: Sinemet, Requip and artane  ALLERGIES:  No Known Allergies  CURRENT MEDICATIONS:  Current Outpatient Medications on File Prior to Visit  Medication Sig Dispense Refill  . ALPRAZolam (XANAX) 0.25 MG tablet Take 0.25 mg by mouth daily. FOR ANXIETY  5  . amantadine (SYMMETREL) 100 MG capsule Take 1 capsule (100 mg total) by mouth 2 (two) times daily. 180 capsule 1  . amantadine (SYMMETREL) 100 MG capsule TAKE ONE CAPSULE BY MOUTH TWICE A DAY 60 capsule 5  . carbidopa-levodopa (SINEMET IR) 25-100 MG tablet Take 1 tablet by mouth 2 (two) times daily. 180 tablet 1  . carbidopa-levodopa-entacapone (STALEVO) 31.25-125-200 MG tablet 2 tablets at 7 am, 1 at 11 am, 2 at 4 pm 450 tablet 1  . digoxin (LANOXIN) 0.25 MG tablet Take 0.25 mg by mouth daily.     Marland Kitchen FLUoxetine (PROZAC) 40 MG capsule Take 40 mg by mouth daily.    . metFORMIN (GLUCOPHAGE-XR) 500 MG 24 hr tablet     . metoprolol tartrate (LOPRESSOR) 25 MG tablet Take 0.5 tablets (12.5 mg total) by mouth 2 (two) times daily. 30 tablet 0  . Multiple Vitamin (MULTIVITAMIN) tablet Take 1 tablet by mouth daily.    . Multiple Vitamins-Minerals (EYE VITAMINS PO) Take by mouth daily.    . simvastatin (ZOCOR) 40 MG tablet Take 40 mg by mouth every evening.    . warfarin (COUMADIN) 5 MG tablet Take 1 tablet (5 mg total) by mouth daily. Take 1 (5 mg) tablet by mouth daily. Follow with your PCP regarding dosing and INR monitoring.     No current facility-administered medications on file prior to visit.     PAST MEDICAL HISTORY:   Past Medical History:  Diagnosis Date  . Atrial fibrillation (HCC)   . Depression   . Hyperlipidemia    dx circa 2008  . Hypertension    dx  circa 2008  . Osteoarthritis    "L knee primarily" (05/17/2014)  . Parkinson's disease    dx circa 1990 by Dr. Thad Ranger    PAST SURGICAL HISTORY:   Past Surgical History:  Procedure  Laterality Date  . COLONOSCOPY     2006 by Jeani Hawking (clear per pt)  . TONSILLECTOMY  ~ 1950  . TUBAL LIGATION  ~ 1983    SOCIAL HISTORY:   Social History   Socioeconomic History  . Marital status: Married    Spouse name: Not on file  . Number of children: 1  . Years of education: 75  . Highest education level: Not on file  Occupational History  . Occupation: Child psychotherapist, IT consultant, then [employer]     Comment: clerical at [employer]  . Financial resource strain: Not on file  . Food insecurity:    Worry: Not on file    Inability: Not on file  . Transportation needs:    Medical: Not on file    Non-medical: Not on file  Tobacco Use  . Smoking status: Never Smoker  . Smokeless tobacco: Never Used  Substance and Sexual Activity  . Alcohol use: Yes    Alcohol/week: 3.0 standard drinks    Types: 3 Glasses of wine per week    Comment: 3 times per week (wine)  . Drug use: No  . Sexual activity: Not Currently  Lifestyle  . Physical activity:    Days per week: Not on file    Minutes per session: Not on file  . Stress: Not on file  Relationships  . Social connections:    Talks on phone: Not on file    Gets together: Not on file    Attends religious service: Not on file    Active member of club or organization: Not on file    Attends meetings of clubs or organizations: Not on file    Relationship status: Not on file  . Intimate partner violence:    Fear of current or ex partner: Not on file    Emotionally abused: Not on file    Physically abused: Not on file    Forced sexual activity: Not on file  Other Topics Concern  . Not on file  Social History Narrative  . Not on file    FAMILY HISTORY:   Family Status  Relation Name Status  . Father  Deceased at age 59       AAA  . Mother  Deceased at age 31       "old age"  . Sister Robbie Lis Alive       healthy  . Child  Alive       1 son, healthy    ROS:  A complete  10 system review of systems was obtained and was unremarkable apart from what is mentioned above.  PHYSICAL EXAMINATION:      VITALS:   There were no vitals filed for this visit.  GEN:  The patient appears stated age and is in NAD. HEENT:  Normocephalic, atraumatic.  The mucous membranes are moist. The superficial temporal arteries are without ropiness or tenderness. CV:  Irreg Lungs:  CTAB Neck/HEME:  There are no carotid bruits bilaterally.   Neurological examination:  Orientation:  Pt is alert and oriented x 3 Cranial nerves: There is good facial symmetry. The visual fields are full to confrontational testing. The speech is fluent and clear. Soft palate rises symmetrically and there  is no tongue deviation. Hearing is intact to conversational tone. Sensation: Sensation is intact to light touch throughout Motor: Strength is 5/5 in the bilateral upper and lower extremities.   Shoulder shrug is equal and symmetric.  There is no pronator drift.   Movement examination: Tone:  Mild increased tone in the LUE Abnormal movements: There is bilateral UE resting tremor and LLE resting tremor Coordination:  There is decremation with all form of RAMS, including alternating supination and pronation of the forearm, hand opening and closing, finger taps, heel taps and toe taps on the L.  She has trouble with with alternating supination and pronation of the forearm heel taps and toe taps on right. Gait and Station: no longer ambulates.  In [facility]  Labs: Lab work is reviewed.  Lab work was dated July 10, 2018.  Sodium is 138, potassium 3.9, chloride 98, CO2 29, BUN 16, creatinine 0.69, AST 15, ALT 7, total cholesterol 237, triglycerides 183, HDL 49.  Hemoglobin A1c was 6.7.  White blood cells were 9.2, hemoglobin 14.4, hematocrit 42.1 and platelets 282.  ASSESSMENT/PLAN:  1.  Parkinsons  -change Stalevo 125 mg to 2 at 7am/1 at 11am/2 at 4 pm  -d/c IR carbidopa/levodopa 25/100.    -told pt not to  change meds without talking to me  -continue amantadine 100 mg bid.  Higher dosages caused hallucinations  -requip has been d/c due to hallucinations and cognitive change.    -encouraged exercise 2.  Excess EtOH use  -  She reports that she is no longer drinking EtOH and confirmed again about that today   3.  Depression.  -Has been the greatest barrier to good health. Discussed this with her new caregiver today.  On Prozac.  Following with PCP.  Has not returned to therapist.  Relies on caregiving  4.  ***

## 2018-08-01 ENCOUNTER — Ambulatory Visit: Payer: Medicare Other | Admitting: Neurology

## 2018-08-11 ENCOUNTER — Ambulatory Visit: Payer: Medicare Other | Admitting: Internal Medicine

## 2018-08-12 ENCOUNTER — Other Ambulatory Visit: Payer: Self-pay | Admitting: Neurology

## 2018-09-02 ENCOUNTER — Telehealth: Payer: Self-pay

## 2018-09-02 DIAGNOSIS — I340 Nonrheumatic mitral (valve) insufficiency: Secondary | ICD-10-CM | POA: Insufficient documentation

## 2018-09-02 NOTE — Telephone Encounter (Signed)
TELEPHONE CALL NOTE  Christy Pham has been deemed a candidate for a follow-up tele-health visit to limit community exposure during the Covid-19 pandemic. I spoke with the patient via phone to ensure availability of phone/video source, confirm preferred email & phone number, and discuss instructions and expectations.  I reminded Christy Pham to be prepared with any vital sign and/or heart rhythm information that could potentially be obtained via home monitoring, at the time of her visit. I reminded Christy Pham to expect a phone call at the time of her visit if her visit.  Did the patient verbally acknowledge consent to treatment? YES  Dustin Flock, RN 09/02/2018 2:23 PM   DOWNLOADING THE WEBEX SOFTWARE TO SMARTPHONE  - If Apple, go to [employer] and type in WebEx in the search bar. Download [employer], the blue/green circle. The app is free but as with any other app downloads, their phone may require them to verify saved payment information or Apple password. The patient does NOT have to create an account.  - If Android, ask patient to go to [employer] and type in WebEx in the search bar. Download [employer], the blue/green circle. The app is free but as with any other app downloads, their phone may require them to verify saved payment information or Android password. The patient does NOT have to create an account.   CONSENT FOR TELE-HEALTH VISIT - PLEASE REVIEW  I hereby voluntarily request, consent and authorize [facility] and its employed or contracted physicians, physician assistants, nurse practitioners or other licensed health care professionals (the Practitioner), to provide me with telemedicine health care services (the "Services") as deemed necessary by the treating Practitioner. I acknowledge and consent to receive the Services by the Practitioner via telemedicine. I understand that the telemedicine visit will involve communicating with the  Practitioner through live audiovisual communication technology and the disclosure of certain medical information by electronic transmission. I acknowledge that I have been given the opportunity to request an in-person assessment or other available alternative prior to the telemedicine visit and am voluntarily participating in the telemedicine visit.  I understand that I have the right to withhold or withdraw my consent to the use of telemedicine in the course of my care at any time, without affecting my right to future care or treatment, and that the Practitioner or I may terminate the telemedicine visit at any time. I understand that I have the right to inspect all information obtained and/or recorded in the course of the telemedicine visit and may receive copies of available information for a reasonable fee.  I understand that some of the potential risks of receiving the Services via telemedicine include:  Marland Kitchen Delay or interruption in medical evaluation due to technological equipment failure or disruption; . Information transmitted may not be sufficient (e.g. poor resolution of images) to allow for appropriate medical decision making by the Practitioner; and/or  . In rare instances, security protocols could fail, causing a breach of personal health information.  Furthermore, I acknowledge that it is my responsibility to provide information about my medical history, conditions and care that is complete and accurate to the best of my ability. I acknowledge that Practitioner's advice, recommendations, and/or decision may be based on factors not within their control, such as incomplete or inaccurate data provided by me or distortions of diagnostic images or specimens that may result from electronic transmissions. I understand that the practice of medicine is not an  exact science and that Practitioner makes no warranties or guarantees regarding treatment outcomes. I acknowledge that I will receive a copy of this  consent concurrently upon execution via email to the email address I last provided but may also request a printed copy by calling the office of [facility].    I understand that my insurance will be billed for this visit.   I have read or had this consent read to me. . I understand the contents of this consent, which adequately explains the benefits and risks of the Services being provided via telemedicine.  . I have been provided ample opportunity to ask questions regarding this consent and the Services and have had my questions answered to my satisfaction. . I give my informed consent for the services to be provided through the use of telemedicine in my medical care  By participating in this telemedicine visit I agree to the above.

## 2018-09-02 NOTE — Progress Notes (Signed)
Virtual Visit atetmpted but due to technical issues had to be converted toTelephone Note   This visit type was conducted due to national recommendations for restrictions regarding the COVID-19 Pandemic (e.g. social distancing) in an effort to limit this patient's exposure and mitigate transmission in our community.  Due to her co-morbid illnesses, this patient is at least at moderate risk for complications without adequate follow up.  This format is felt to be most appropriate for this patient at this time.  The patient did not have access to video technology/had technical difficulties with video requiring transitioning to audio format only (telephone).  All issues noted in this document were discussed and addressed.  No physical exam could be performed with this format.  Please refer to the patient's chart for her  consent to telehealth for [facility].  Evaluation Performed:  Follow-up visit  This visit type was conducted due to national recommendations for restrictions regarding the COVID-19 Pandemic (e.g. social distancing).  This format is felt to be most appropriate for this patient at this time.  All issues noted in this document were discussed and addressed.  No physical exam was performed (except for noted visual exam findings with Video Visits).  Please refer to the patient's chart (MyChart message for video visits and phone note for telephone visits) for the patient's consent to telehealth for [facility].  Date:  09/03/2018   ID:  Christy Pham, DOB 1945-07-08, MRN [id number]  Patient Location:  Home  Provider location:   [place]  PCP:  Barbie Banner, MD  Cardiologist:  Armanda Magic, MD Electrophysiologist:  None   Chief Complaint:  Evaluation of atrial fibrillation  History of Present Illness:    Christy Pham is a 73 y.o. female who presents via audio/video conferencing for a telehealth visit today.    This is a 73yo female with a history of HTN, hyperlipidemia  and atrial fibrillation.  She has had atrial fibrillation for years and remotely was followed by Dr. Patty Sermons but has not been seen in 4 years.  She also has Parkinson's disease for over 25 years.  She is wheelchair bound due to this.  Her last echo was in 2013 showing normal LVF with EF 50-55% with mild to moderate MR.  She is on chronic anticoagulation with coumadin with CHADS2VASC score of 3.  He is here today for followup and is doing well.  He denies any chest pain or pressure, PND, orthopnea, dizziness, palpitations or syncope. She does get some DOE when she gets anxious. She has chronic LE edema which is stable.  She is compliant with his meds and is tolerating meds with no SE.    The patient does not have symptoms concerning for COVID-19 infection (fever, chills, cough, or new shortness of breath).    Prior CV studies:   The following studies were reviewed today:  Prior echo 2013  Past Medical History:  Diagnosis Date  . Chronic atrial fibrillation   . Depression   . Diabetes (HCC)   . Hyperlipidemia    dx circa 2008  . Hypertension    dx circa 2008  . Mitral regurgitation    mild to moderate by echo 2013  . Osteoarthritis    "L knee primarily" (05/17/2014)  . Parkinson's disease    dx circa 1990 by Dr. Thad Ranger   Past Surgical History:  Procedure Laterality Date  . COLONOSCOPY     2006 by Jeani Hawking (clear per pt)  . TONSILLECTOMY  ~  1950  . TUBAL LIGATION  ~ 1983     Current Meds  Medication Sig  . ALPRAZolam (XANAX) 0.25 MG tablet Take 0.25 mg by mouth daily. FOR ANXIETY  . amantadine (SYMMETREL) 100 MG capsule TAKE ONE CAPSULE BY MOUTH TWICE A DAY  . carbidopa-levodopa-entacapone (STALEVO) 31.25-125-200 MG tablet 2 tablets at 7 am, 1 at 11 am, 2 at 4 pm  . digoxin (LANOXIN) 0.25 MG tablet Take 0.25 mg by mouth daily.   Marland Kitchen. FLUoxetine (PROZAC) 40 MG capsule Take 40 mg by mouth daily.  . metFORMIN (GLUCOPHAGE-XR) 500 MG 24 hr tablet Take 500 mg by mouth daily.   .  metoprolol tartrate (LOPRESSOR) 25 MG tablet Take 0.5 tablets (12.5 mg total) by mouth 2 (two) times daily.  . Multiple Vitamin (MULTIVITAMIN) tablet Take 1 tablet by mouth daily.  . Multiple Vitamins-Minerals (EYE VITAMINS PO) Take 2 tablets by mouth daily.   . simvastatin (ZOCOR) 40 MG tablet Take 40 mg by mouth every evening.  . warfarin (COUMADIN) 5 MG tablet Take 1 tablet (5 mg total) by mouth daily. Take 1 (5 mg) tablet by mouth daily. Follow with your PCP regarding dosing and INR monitoring.     Allergies:   Patient has no known allergies.   Social History   Tobacco Use  . Smoking status: Never Smoker  . Smokeless tobacco: Never Used  Substance Use Topics  . Alcohol use: Yes    Alcohol/week: 3.0 standard drinks    Types: 3 Glasses of wine per week    Comment: 3 times per week (wine)  . Drug use: No     Family Hx: The patient's family history includes Colon cancer (age of onset: 7558) in her father; Coronary artery disease (age of onset: 9080) in her father; Dementia in her mother; Restless legs syndrome in her father.  ROS:   Please see the history of present illness.     All other systems reviewed and are negative.   Labs/Other Tests and Data Reviewed:    Recent Labs: No results found for requested labs within last 8760 hours.   Recent Lipid Panel Lab Results  Component Value Date/Time   CHOL  12/03/2009 05:15 AM    156        ATP III CLASSIFICATION:  <200     mg/dL   Desirable  [id number]  mg/dL   Borderline High  >=045>=240    mg/dL   High          TRIG 79 12/03/2009 05:15 AM   HDL 57 12/03/2009 05:15 AM   CHOLHDL 2.7 12/03/2009 05:15 AM   LDLCALC  12/03/2009 05:15 AM    83        Total Cholesterol/HDL:CHD Risk Coronary Heart Disease Risk Table                     Men   Women  1/2 Average Risk   3.4   3.3  Average Risk       5.0   4.4  2 X Average Risk   9.6   7.1  3 X Average Risk  23.4   11.0        Use the calculated Patient Ratio above and the CHD Risk  Table to determine the patient's CHD Risk.        ATP III CLASSIFICATION (LDL):  <100     mg/dL   Optimal  [id number]  mg/dL   Near or Above  Optimal  130-159  mg/dL   Borderline  784-696  mg/dL   High  >295     mg/dL   Very High    Wt Readings from Last 3 Encounters:  09/03/18 190 lb (86.2 kg)  05/18/14 215 lb 6.4 oz (97.7 kg)  08/16/11 188 lb 4.4 oz (85.4 kg)     Objective:    Vital Signs:  BP 120/70   Ht  (1.626 m)   Wt 190 lb (86.2 kg) Comment: pt stated her weight  BMI 32.61 kg/m    Due to technical issues the video visit had to be converted to telephone visit and therefore Phsical exam could not be completed  ASSESSMENT & PLAN:    1.  Chronic atrial fibrillation -  Her heart rate is controlled at home.  She will continue on Coumadin (INRs monitored through her PCP Dr. Andrey Campanile).  She will continue on Lopressor 12.5mg  BID and digoxin 0.25mg  daily.  She has not had any problems with bleeding issues.  I will check a dig level in June once Coronavirus pandemic has improved.    2.  Hypertension - She checks her BP at home and it is controlled.  She will continue on lopressor 12.5mg  BID.  3.  Mild to moderate MR - this was noted on echo in 2013.  I will check another echo to assess and make sure this has not progressed.   COVID-19 Education: The signs and symptoms of COVID-19 were discussed with the patient and how to seek care for testing (follow up with PCP or arrange E-visit).  The importance of social distancing was discussed today.  Patient Risk:   After full review of this patient's clinical status, I feel that they are at least moderate risk at this time.  Time:   Today, I have spent 45 minutes with the patient reviewing chart and discussing medical problems including mitral regurgitation, hypertension and chronic atrial fbrillation and reviewing symptoms of COVID 19 and the ways to protect against contracting the virus with telehealth technology.       Medication Adjustments/Labs and Tests Ordered: Current medicines are reviewed at length with the patient today.  Concerns regarding medicines are outlined above.  Tests Ordered: No orders of the defined types were placed in this encounter.  Medication Changes: No orders of the defined types were placed in this encounter.   Disposition:  Follow up in 1 year(s)  Signed, Armanda Magic, MD  09/03/2018 9:32 AM     [facility]

## 2018-09-03 ENCOUNTER — Other Ambulatory Visit: Payer: Self-pay

## 2018-09-03 ENCOUNTER — Encounter: Payer: Self-pay | Admitting: Cardiology

## 2018-09-03 ENCOUNTER — Telehealth (INDEPENDENT_AMBULATORY_CARE_PROVIDER_SITE_OTHER): Payer: Medicare Other | Admitting: Cardiology

## 2018-09-03 DIAGNOSIS — I340 Nonrheumatic mitral (valve) insufficiency: Secondary | ICD-10-CM | POA: Diagnosis not present

## 2018-09-03 DIAGNOSIS — I482 Chronic atrial fibrillation, unspecified: Secondary | ICD-10-CM

## 2018-09-03 DIAGNOSIS — I10 Essential (primary) hypertension: Secondary | ICD-10-CM

## 2018-09-03 NOTE — Patient Instructions (Signed)
Medication Instructions:  Your physician recommends that you continue on your current medications as directed. Please refer to the Current Medication list given to you today.  If you need a refill on your cardiac medications before your next appointment, please call your pharmacy.   Lab work: Future: Digoxin Level: done in late June, same day as Echo. If you have labs (blood work) drawn today and your tests are completely normal, you will receive your results only by: Marland Kitchen MyChart Message (if you have MyChart) OR . A paper copy in the mail If you have any lab test that is abnormal or we need to change your treatment, we will call you to review the results.  Testing/Procedures: Your physician has requested that you have an echocardiogram in late June. Echocardiography is a painless test that uses sound waves to create images of your heart. It provides your doctor with information about the size and shape of your heart and how well your heart's chambers and valves are working. This procedure takes approximately one hour. There are no restrictions for this procedure.  Follow-Up: At [facility], you and your health needs are our priority.  As part of our continuing mission to provide you with exceptional heart care, we have created designated Provider Care Teams.  These Care Teams include your primary Cardiologist (physician) and Advanced Practice Providers (APPs -  Physician Assistants and Nurse Practitioners) who all work together to provide you with the care you need, when you need it. You will need a follow up appointment in 1 years.  Please call our office 2 months in advance to schedule this appointment.  You may see Dr. Mayford Knife or one of the following Advanced Practice Providers on your designated Care Team:   [place], PA-C Ronie Spies, PA-C . Jacolyn Reedy, PA-C

## 2018-09-05 NOTE — Addendum Note (Signed)
Addended by: Dustin Flock on: 09/05/2018 02:44 PM   Modules accepted: Orders

## 2018-09-05 NOTE — Telephone Encounter (Signed)
Erroneous encounter

## 2018-11-20 ENCOUNTER — Telehealth: Payer: Self-pay | Admitting: *Deleted

## 2018-11-20 NOTE — Telephone Encounter (Signed)
    COVID-19 Pre-Screening Questions:  . In the past 7 to 10 days have you had a cough,  shortness of breath, headache, congestion, fever (100 or greater) body aches, chills, sore throat, or sudden loss of taste or sense of smell? . Have you been around anyone with known Covid 19. . Have you been around anyone who is awaiting Covid 19 test results in the past 7 to 10 days? . Have you been around anyone who has been exposed to Covid 19, or has mentioned symptoms of Covid 19 within the past 7 to 10 days?  If you have any concerns/questions about symptoms patients report during screening (either on the phone or at [facility]). Contact the provider seeing the patient or DOD for further guidance.  If neither are available contact a member of the leadership team.           Contacted patient via phone call. Answered all Covid 19 questions NO. Has a mask. KB  

## 2018-11-21 ENCOUNTER — Telehealth (HOSPITAL_COMMUNITY): Payer: Self-pay | Admitting: *Deleted

## 2018-11-21 NOTE — Telephone Encounter (Signed)
COVID-19 Pre-Screening Questions:  . Do you currently have a fever?  NO (yes = cancel and refer to pcp for e-visit) . Have you recently travelled on a cruise, internationally, or to [place], [place], [place], [place], [place], or [place] [employer]) ? No (yes = cancel, stay home, monitor symptoms, and contact pcp or initiate e-visit if symptoms develop) . Have you been in contact with someone that is currently pending confirmation of Covid19 testing or has been confirmed to have the [place] virus?  No (yes = cancel, stay home, away from tested individual, monitor symptoms, and contact pcp or initiate e-visit if symptoms develop) . Are you currently experiencing fatigue or cough? No (yes = pt should be prepared to have a mask placed at the time of their visit).   . Reiterated no additional visitors. Eartha Inch no earlier than 15 minutes before appointment time. . Please bring own mask.  Christy Pham

## 2018-11-24 ENCOUNTER — Ambulatory Visit (HOSPITAL_COMMUNITY): Payer: Medicare Other

## 2018-11-24 ENCOUNTER — Other Ambulatory Visit: Payer: Self-pay

## 2018-11-24 ENCOUNTER — Other Ambulatory Visit: Payer: Medicare Other

## 2018-11-24 ENCOUNTER — Encounter (INDEPENDENT_AMBULATORY_CARE_PROVIDER_SITE_OTHER): Payer: Self-pay

## 2018-11-24 DIAGNOSIS — I482 Chronic atrial fibrillation, unspecified: Secondary | ICD-10-CM

## 2018-11-24 LAB — DIGOXIN LEVEL: Digoxin, Serum: 1.1 ng/mL — ABNORMAL HIGH (ref 0.5–0.9)

## 2018-11-27 ENCOUNTER — Telehealth: Payer: Self-pay | Admitting: Cardiology

## 2018-11-27 ENCOUNTER — Telehealth: Payer: Self-pay

## 2018-11-27 ENCOUNTER — Ambulatory Visit (HOSPITAL_COMMUNITY)
Admission: RE | Admit: 2018-11-27 | Discharge: 2018-11-27 | Disposition: A | Discharge status: Home / Self Care | Payer: Medicare Other | Source: Ambulatory Visit | Attending: Cardiology | Admitting: Cardiology

## 2018-11-27 ENCOUNTER — Other Ambulatory Visit: Payer: Self-pay

## 2018-11-27 DIAGNOSIS — I340 Nonrheumatic mitral (valve) insufficiency: Secondary | ICD-10-CM | POA: Diagnosis not present

## 2018-11-27 DIAGNOSIS — I4891 Unspecified atrial fibrillation: Secondary | ICD-10-CM | POA: Diagnosis not present

## 2018-11-27 DIAGNOSIS — E785 Hyperlipidemia, unspecified: Secondary | ICD-10-CM | POA: Diagnosis not present

## 2018-11-27 DIAGNOSIS — I10 Essential (primary) hypertension: Secondary | ICD-10-CM | POA: Diagnosis not present

## 2018-11-27 DIAGNOSIS — G20 Parkinson's disease: Secondary | ICD-10-CM | POA: Insufficient documentation

## 2018-11-27 DIAGNOSIS — I080 Rheumatic disorders of both mitral and aortic valves: Secondary | ICD-10-CM | POA: Insufficient documentation

## 2018-11-27 DIAGNOSIS — E119 Type 2 diabetes mellitus without complications: Secondary | ICD-10-CM | POA: Insufficient documentation

## 2018-11-27 MED ORDER — DIGOXIN 125 MCG PO TABS: 0.1250 mg | ORAL_TABLET | Freq: Every day | ORAL | 3 refills | Status: DC

## 2018-11-27 NOTE — Telephone Encounter (Signed)
New Message:    Pharmacist called, she wants to know if it will be alright to split the 250 mg of Digoxin in half? She said the pt was changed to 125 mg and had just picked up the 250 mg of Digoxin.Marland Kitchen

## 2018-11-27 NOTE — Progress Notes (Signed)
  Echocardiogram 2D Echocardiogram has been performed.  Christy Pham 11/27/2018, 11:48 AM

## 2018-11-27 NOTE — Telephone Encounter (Signed)
Spoke with the patient, she expressed understanding about decreasing Digoxin to 0.125 mg, daily. She had no further questions.

## 2018-11-27 NOTE — Telephone Encounter (Signed)
Spoke with the pharmacy, advised it was fine to cut the tablets in half. The patient just requested to have the new dose sent in.

## 2018-11-27 NOTE — Telephone Encounter (Signed)
Notes recorded by Dorothy Spark, MD on 11/26/2018 at 4:45 PM EDT  Elevated digoxin level, please decrease the dose from 0.25 to 1.25 mg daily

## 2018-11-27 NOTE — Telephone Encounter (Signed)
-----   Message from Dorothy Spark, MD sent at 11/26/2018  5:20 PM EDT ----- Yes ----- Message ----- From: Sarina Ill, RN Sent: 11/26/2018   4:48 PM EDT To: Dorothy Spark, MD  Decrease to 0.125 mg?

## 2018-12-17 ENCOUNTER — Telehealth: Payer: Self-pay | Admitting: Neurology

## 2018-12-17 NOTE — Telephone Encounter (Signed)
Called spoke with patient Pt called with c/o:  confusion/hallucinations New medications?  No. When did they start?  Comes and goes started last week If hallucinations are new, has patient been checked for infection, including UTI?  No. Current medications prescribed by Dr. Carles Collet and TIMES taking the medications: Amantadine 100 MG BID, STALEVO 31.25-125-200 MG 2 tablets at 7am, 1 tablet at 11am, 2 tablets at 4pm  Pt states that she sees people in her back yard that's not their. She states that Dr. Carles Collet informed her to call if she starts having hallucinations.

## 2018-12-17 NOTE — Telephone Encounter (Signed)
It looks like she has been on those medications/dose for quite some time.  Therefore, I would first recommend that her PCP check for a UTI.

## 2018-12-17 NOTE — Telephone Encounter (Signed)
Called spoke with patient he will contact PCP to have U/A to rule out UTI She was informed to contact out office when results are back

## 2018-12-17 NOTE — Telephone Encounter (Signed)
Patient left msg about needing to speak with the nurse regarding a situation but did not give details. Thanks!

## 2018-12-22 ENCOUNTER — Other Ambulatory Visit (HOSPITAL_COMMUNITY): Payer: Medicare Other

## 2019-01-02 ENCOUNTER — Telehealth: Payer: Self-pay | Admitting: Neurology

## 2019-01-02 NOTE — Telephone Encounter (Signed)
Just got a fax sheet from PCP regarding message from patient for which she called our office on 7/22.  Doesn't look like urine was tested as pt stated doesn't have anyone to bring her into office.  They sent her In keflex.  Pt missed/no showed march appt in our office.  Has no follow up here.

## 2019-01-27 ENCOUNTER — Telehealth: Payer: Self-pay | Admitting: Neurology

## 2019-01-27 NOTE — Telephone Encounter (Signed)
Pt needs to make appt.  She no showed her appt in march.  She has no appt scheduled with me.  Virtual okay if no way to come in but I cannot continue to treat her on the phone.  Not good for her care.

## 2019-01-27 NOTE — Telephone Encounter (Signed)
Pt called with c/o:  confusion/hallucinations New medications?  No. When did they start?   1 1/2 weeks ago If hallucinations are new, has patient been checked for infection, including UTI?  Yes.  2 1/2 weeks ago was treated for UTI x 7 days Current medications prescribed by Dr. Carles Collet and TIMES taking the medications: Stalevo 2 at 7AM, 1 at 11AM, 2 at 4PM, Amantadine 1 po BID

## 2019-01-27 NOTE — Telephone Encounter (Signed)
Patient called to report she is having hallucinations of people in her backyard again that are not supposed to be there.

## 2019-01-27 NOTE — Telephone Encounter (Signed)
Called patient offered her appt this week that was a cancellation she declined appt and requested the next available  Patient scheduled for 06/30/2019.

## 2019-06-29 NOTE — Progress Notes (Signed)
Pt scheduled for phone visit today.  Confirmed with my MA.  Called and no answer.  Left message to call back if she got message.  1 hour after appt, no call received.

## 2019-06-30 ENCOUNTER — Other Ambulatory Visit: Payer: Self-pay

## 2019-06-30 ENCOUNTER — Encounter: Payer: Self-pay | Admitting: Neurology

## 2019-06-30 ENCOUNTER — Telehealth (INDEPENDENT_AMBULATORY_CARE_PROVIDER_SITE_OTHER): Payer: Medicare Other | Admitting: Neurology

## 2019-06-30 DIAGNOSIS — Z5329 Procedure and treatment not carried out because of patient's decision for other reasons: Secondary | ICD-10-CM

## 2019-12-10 ENCOUNTER — Other Ambulatory Visit: Payer: Self-pay | Admitting: Neurology

## 2019-12-16 ENCOUNTER — Telehealth: Payer: Self-pay | Admitting: Neurology

## 2019-12-19 ENCOUNTER — Other Ambulatory Visit: Payer: Self-pay | Admitting: Cardiology

## 2020-01-01 ENCOUNTER — Emergency Department (HOSPITAL_COMMUNITY): Payer: Medicare Other

## 2020-01-01 ENCOUNTER — Other Ambulatory Visit: Payer: Self-pay

## 2020-01-01 ENCOUNTER — Encounter (HOSPITAL_COMMUNITY): Payer: Self-pay | Admitting: Radiology

## 2020-01-01 ENCOUNTER — Inpatient Hospital Stay (HOSPITAL_COMMUNITY)
Admission: EM | Admit: 2020-01-01 | Discharge: 2020-01-05 | DRG: 871 | Disposition: A | Discharge status: Skilled Nursing Facility | Payer: Medicare Other | Attending: Internal Medicine | Admitting: Internal Medicine

## 2020-01-01 DIAGNOSIS — Z87440 Personal history of urinary (tract) infections: Secondary | ICD-10-CM

## 2020-01-01 DIAGNOSIS — Z66 Do not resuscitate: Secondary | ICD-10-CM | POA: Diagnosis present

## 2020-01-01 DIAGNOSIS — Z20822 Contact with and (suspected) exposure to covid-19: Secondary | ICD-10-CM | POA: Diagnosis present

## 2020-01-01 DIAGNOSIS — G4733 Obstructive sleep apnea (adult) (pediatric): Secondary | ICD-10-CM | POA: Diagnosis present

## 2020-01-01 DIAGNOSIS — G20 Parkinson's disease: Secondary | ICD-10-CM | POA: Diagnosis present

## 2020-01-01 DIAGNOSIS — R441 Visual hallucinations: Secondary | ICD-10-CM | POA: Diagnosis present

## 2020-01-01 DIAGNOSIS — I5032 Chronic diastolic (congestive) heart failure: Secondary | ICD-10-CM | POA: Diagnosis present

## 2020-01-01 DIAGNOSIS — I5022 Chronic systolic (congestive) heart failure: Secondary | ICD-10-CM | POA: Diagnosis not present

## 2020-01-01 DIAGNOSIS — I4819 Other persistent atrial fibrillation: Secondary | ICD-10-CM | POA: Diagnosis present

## 2020-01-01 DIAGNOSIS — E860 Dehydration: Secondary | ICD-10-CM | POA: Diagnosis present

## 2020-01-01 DIAGNOSIS — E876 Hypokalemia: Secondary | ICD-10-CM | POA: Diagnosis present

## 2020-01-01 DIAGNOSIS — Z7984 Long term (current) use of oral hypoglycemic drugs: Secondary | ICD-10-CM

## 2020-01-01 DIAGNOSIS — R531 Weakness: Secondary | ICD-10-CM | POA: Diagnosis present

## 2020-01-01 DIAGNOSIS — R443 Hallucinations, unspecified: Secondary | ICD-10-CM

## 2020-01-01 DIAGNOSIS — W19XXXA Unspecified fall, initial encounter: Secondary | ICD-10-CM | POA: Diagnosis present

## 2020-01-01 DIAGNOSIS — E669 Obesity, unspecified: Secondary | ICD-10-CM | POA: Diagnosis present

## 2020-01-01 DIAGNOSIS — I10 Essential (primary) hypertension: Secondary | ICD-10-CM | POA: Diagnosis not present

## 2020-01-01 DIAGNOSIS — N390 Urinary tract infection, site not specified: Secondary | ICD-10-CM | POA: Diagnosis present

## 2020-01-01 DIAGNOSIS — I110 Hypertensive heart disease with heart failure: Secondary | ICD-10-CM | POA: Diagnosis present

## 2020-01-01 DIAGNOSIS — I495 Sick sinus syndrome: Secondary | ICD-10-CM | POA: Diagnosis present

## 2020-01-01 DIAGNOSIS — R197 Diarrhea, unspecified: Secondary | ICD-10-CM | POA: Diagnosis present

## 2020-01-01 DIAGNOSIS — R296 Repeated falls: Secondary | ICD-10-CM | POA: Diagnosis present

## 2020-01-01 DIAGNOSIS — Z6834 Body mass index (BMI) 34.0-34.9, adult: Secondary | ICD-10-CM | POA: Diagnosis not present

## 2020-01-01 DIAGNOSIS — A419 Sepsis, unspecified organism: Secondary | ICD-10-CM | POA: Diagnosis present

## 2020-01-01 DIAGNOSIS — Z800 Family history of malignant neoplasm of digestive organs: Secondary | ICD-10-CM

## 2020-01-01 DIAGNOSIS — S8001XA Contusion of right knee, initial encounter: Secondary | ICD-10-CM | POA: Diagnosis present

## 2020-01-01 DIAGNOSIS — G9341 Metabolic encephalopathy: Secondary | ICD-10-CM | POA: Diagnosis present

## 2020-01-01 DIAGNOSIS — F329 Major depressive disorder, single episode, unspecified: Secondary | ICD-10-CM | POA: Diagnosis present

## 2020-01-01 DIAGNOSIS — R112 Nausea with vomiting, unspecified: Secondary | ICD-10-CM | POA: Diagnosis present

## 2020-01-01 DIAGNOSIS — E119 Type 2 diabetes mellitus without complications: Secondary | ICD-10-CM | POA: Diagnosis present

## 2020-01-01 DIAGNOSIS — R6520 Severe sepsis without septic shock: Secondary | ICD-10-CM | POA: Diagnosis present

## 2020-01-01 DIAGNOSIS — Z993 Dependence on wheelchair: Secondary | ICD-10-CM

## 2020-01-01 DIAGNOSIS — Z79899 Other long term (current) drug therapy: Secondary | ICD-10-CM

## 2020-01-01 DIAGNOSIS — I4891 Unspecified atrial fibrillation: Secondary | ICD-10-CM | POA: Diagnosis present

## 2020-01-01 DIAGNOSIS — Z7901 Long term (current) use of anticoagulants: Secondary | ICD-10-CM

## 2020-01-01 DIAGNOSIS — Z8249 Family history of ischemic heart disease and other diseases of the circulatory system: Secondary | ICD-10-CM

## 2020-01-01 DIAGNOSIS — E785 Hyperlipidemia, unspecified: Secondary | ICD-10-CM | POA: Diagnosis present

## 2020-01-01 DIAGNOSIS — Z6841 Body Mass Index (BMI) 40.0 and over, adult: Secondary | ICD-10-CM

## 2020-01-01 LAB — COMPREHENSIVE METABOLIC PANEL
ALT: 22 U/L (ref 0–44)
AST: 28 U/L (ref 15–41)
Albumin: 3.8 g/dL (ref 3.5–5.0)
Alkaline Phosphatase: 55 U/L (ref 38–126)
Anion gap: 12 (ref 5–15)
BUN: 13 mg/dL (ref 8–23)
CO2: 25 mmol/L (ref 22–32)
Calcium: 9.1 mg/dL (ref 8.9–10.3)
Chloride: 103 mmol/L (ref 98–111)
Creatinine, Ser: 0.8 mg/dL (ref 0.44–1.00)
GFR calc Af Amer: >60 mL/min (ref >60)
GFR calc non Af Amer: >60 mL/min (ref >60)
Glucose, Bld: 148 mg/dL — ABNORMAL HIGH (ref 70–99)
Potassium: 3.1 mmol/L — ABNORMAL LOW (ref 3.5–5.1)
Sodium: 140 mmol/L (ref 135–145)
Total Bilirubin: 1.4 mg/dL — ABNORMAL HIGH (ref 0.3–1.2)
Total Protein: 6.9 g/dL (ref 6.5–8.1)

## 2020-01-01 LAB — CBC WITH DIFFERENTIAL/PLATELET
Abs Immature Granulocytes: 0.03 10*3/uL (ref 0.00–0.07)
Basophils Absolute: 0.1 10*3/uL (ref 0.0–0.1)
Basophils Relative: 1 %
Eosinophils Absolute: 0.1 10*3/uL (ref 0.0–0.5)
Eosinophils Relative: 1 %
HCT: 35.8 % — ABNORMAL LOW (ref 36.0–46.0)
Hemoglobin: 12 g/dL (ref 12.0–15.0)
Immature Granulocytes: 0 %
Lymphocytes Relative: 27 %
Lymphs Abs: 3 10*3/uL (ref 0.7–4.0)
MCH: 31.2 pg (ref 26.0–34.0)
MCHC: 33.5 g/dL (ref 30.0–36.0)
MCV: 93 fL (ref 80.0–100.0)
Monocytes Absolute: 0.7 10*3/uL (ref 0.1–1.0)
Monocytes Relative: 7 %
Neutro Abs: 7.3 10*3/uL (ref 1.7–7.7)
Neutrophils Relative %: 64 %
Platelets: 268 10*3/uL (ref 150–400)
RBC: 3.85 MIL/uL — ABNORMAL LOW (ref 3.87–5.11)
RDW: 14.5 % (ref 11.5–15.5)
WBC: 11.2 10*3/uL — ABNORMAL HIGH (ref 4.0–10.5)
nRBC: 0 % (ref 0.0–0.2)

## 2020-01-01 LAB — URINALYSIS, ROUTINE W REFLEX MICROSCOPIC
Bilirubin Urine: NEGATIVE
Glucose, UA: NEGATIVE mg/dL
Ketones, ur: 5 mg/dL — AB
Nitrite: POSITIVE — AB
Protein, ur: 30 mg/dL — AB
Specific Gravity, Urine: 1.019 (ref 1.005–1.030)
pH: 5 (ref 5.0–8.0)

## 2020-01-01 LAB — SARS CORONAVIRUS 2 BY RT PCR (HOSPITAL ORDER, PERFORMED IN ~~LOC~~ HOSPITAL LAB): SARS Coronavirus 2: NEGATIVE

## 2020-01-01 LAB — DIGOXIN LEVEL: Digoxin Level: 0.4 ng/mL — ABNORMAL LOW (ref 0.8–2.0)

## 2020-01-01 LAB — PROTIME-INR
INR: 3.4 — ABNORMAL HIGH (ref 0.8–1.2)
Prothrombin Time: 33.4 seconds — ABNORMAL HIGH (ref 11.4–15.2)

## 2020-01-01 LAB — CBG MONITORING, ED: Glucose-Capillary: 134 mg/dL — ABNORMAL HIGH (ref 70–99)

## 2020-01-01 LAB — LACTIC ACID, PLASMA
Lactic Acid, Venous: 2.9 mmol/L (ref 0.5–1.9)
Lactic Acid, Venous: 3.4 mmol/L (ref 0.5–1.9)

## 2020-01-01 MED ORDER — LACTATED RINGERS IV BOLUS
1000.0000 mL | Freq: Once | INTRAVENOUS | Status: AC
  Administered 2020-01-01: 1000 mL via INTRAVENOUS

## 2020-01-01 MED ORDER — LACTATED RINGERS IV SOLN: INTRAVENOUS | Status: DC

## 2020-01-01 MED ORDER — ACETAMINOPHEN 325 MG PO TABS
650.0000 mg | ORAL_TABLET | Freq: Once | ORAL | Status: AC
  Administered 2020-01-01: 650 mg via ORAL
  Filled 2020-01-01: qty 2

## 2020-01-01 MED ORDER — SODIUM CHLORIDE 0.9 % IV SOLN
1.0000 g | INTRAVENOUS | Status: DC
  Administered 2020-01-01: 1 g via INTRAVENOUS
  Filled 2020-01-01: qty 10

## 2020-01-01 MED ORDER — LACTATED RINGERS IV BOLUS (SEPSIS)
2000.0000 mL | Freq: Once | INTRAVENOUS | Status: AC
  Administered 2020-01-01: 2000 mL via INTRAVENOUS

## 2020-01-01 NOTE — ED Triage Notes (Signed)
Pt BIBA from home-  Per EMS- Pt c/o "not feeling right" starting 1 hour PTA.  Pt reports new onset diarrhea at same time, ongoing.  Pt also c/o hallucinations x1 week.   AOx4, non ambulatory at baseline. Pt has home health aid come everyday. Denies n/v, denies fever.   Pt arrives to ED very diaphoretic.

## 2020-01-01 NOTE — ED Notes (Signed)
Family at bedside. 

## 2020-01-01 NOTE — ED Notes (Signed)
X-ray at bedside

## 2020-01-01 NOTE — ED Provider Notes (Signed)
[facility]-EMERGENCY DEPT Provider Note   CSN: [id number] Arrival date & time: 01/01/20  1656     History Chief Complaint  Patient presents with  . Diarrhea  . Atrial Fibrillation    Christy Pham is a 74 y.o. female.  HPI Patient presents with generalized weakness.  Is had for the last hour or 2.  Also had some diarrhea.  States she just feels little bad all over.  Reportedly sweaty upon arrival.  However for the last week has been having visual hallucinations.  States she is seeing people that are not there that she does not know.  No headache.  No history of hallucinations.  Does have bruise to her right knee.  States she had a fall but does not exactly know when and does not think she hit her head.  History of Parkinson's disease.  Also history of urinary tract infections.  History of A. fib chronically on Coumadin. Patient has not had her Covid vaccine because she does not want to wait with other people.    Past Medical History:  Diagnosis Date  . Chronic atrial fibrillation (HCC)   . Depression   . Diabetes (HCC)   . Hyperlipidemia    dx circa 2008  . Hypertension    dx circa 2008  . Mitral regurgitation    mild to moderate by echo 2013  . Osteoarthritis    "L knee primarily" (05/17/2014)  . Parkinson's disease    dx circa 1990 by [name]    Patient Active Problem List   Diagnosis Date Noted  . Mitral regurgitation   . UTI (lower urinary tract infection) 05/17/2014  . Atrial fibrillation with RVR (HCC) 05/17/2014  . Hypertension   . Hyperlipidemia   . Dehydration   . Essential hypertension   . 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 Depression 11/05/2012  . Parkinsonism 09/22/2012  . Depressive disorder, not elsewhere classified 09/22/2012  . Abnormality of gait 09/22/2012  . OBESITY, UNSPECIFIED 01/25/2010  . ATRIAL FIBRILLATION WITH RAPID VENTRICULAR RESPONSE 01/25/2010  . CHRONIC SYSTOLIC HEART FAILURE 01/25/2010    Past Surgical  History:  Procedure Laterality Date  . COLONOSCOPY     2006 by Jeani HawkingPatrick Hung (clear per pt)  . TONSILLECTOMY  ~ 1950  . TUBAL LIGATION  ~ 1983     OB History   No obstetric history on file.     Family History  Problem Relation Age of Onset  . Coronary artery disease Father 4080       s/p CABG  . Colon cancer Father 1158       s/p resection  . Restless legs syndrome Father   . Dementia Mother     Social History   Tobacco Use  . Smoking status: Never Smoker  . Smokeless tobacco: Never Used  Substance Use Topics  . Alcohol use: Yes    Alcohol/week: 3.0 standard drinks    Types: 3 Glasses of wine per week    Comment: 3 times per week (wine)  . Drug use: No    Home Medications Prior to Admission medications   Medication Sig Start Date End Date Taking? Authorizing Provider  ALPRAZolam (XANAX) 0.25 MG tablet Take 0.25 mg by mouth daily. FOR ANXIETY 06/01/14   [provider]  amantadine (SYMMETREL) 100 MG capsule TAKE ONE CAPSULE BY MOUTH TWICE A DAY 04/14/18   Tat, Rebecca S, DO  carbidopa-levodopa-entacapone (STALEVO) 31.25-125-200 MG tablet 2 tablets at 7 am, 1 at 11 am, 2  at 4 pm 02/13/18   Tat, Octaviano Batty, DO  digoxin (LANOXIN) 0.125 MG tablet Take 1 tablet (0.125 mg total) by mouth daily. Please make overdue appt with Dr. Mayford Knife before anymore refills. 1st attempt 12/21/19   Quintella Reichert, MD  FLUoxetine (PROZAC) 40 MG capsule Take 40 mg by mouth daily.    [provider]  metFORMIN (GLUCOPHAGE-XR) 500 MG 24 hr tablet Take 500 mg by mouth daily.  06/17/17   [provider]  metoprolol tartrate (LOPRESSOR) 25 MG tablet Take 0.5 tablets (12.5 mg total) by mouth 2 (two) times daily. 05/19/14   Elgergawy, Leana Roe, MD  Multiple Vitamin (MULTIVITAMIN) tablet Take 1 tablet by mouth daily.    [provider]  Multiple Vitamins-Minerals (EYE VITAMINS PO) Take 2 tablets by mouth daily.     [provider]  simvastatin (ZOCOR) 40 MG tablet Take  40 mg by mouth every evening.    [provider]  warfarin (COUMADIN) 5 MG tablet Take 1 tablet (5 mg total) by mouth daily. Take 1 (5 mg) tablet by mouth daily. Follow with your PCP regarding dosing and INR monitoring. 05/19/14   Elgergawy, Leana Roe, MD    Allergies    Patient has no known allergies.  Review of Systems   Review of Systems  Constitutional: Positive for appetite change, fatigue and fever.  Respiratory: Negative for shortness of breath.   Gastrointestinal: Positive for diarrhea.  Genitourinary: Negative for flank pain.  Musculoskeletal: Negative for back pain.  Skin: Negative for rash.  Neurological: Positive for weakness.  Psychiatric/Behavioral: Positive for hallucinations.    Physical Exam Updated Vital Signs BP 127/75   Pulse 92   Temp (!) 101 F (38.3 C) (Rectal)   Resp (!) 28   Ht 5' 3.5" (1.613 m)   Wt 90.7 kg   SpO2 94%   BMI 34.87 kg/m   Physical Exam Vitals reviewed.  Constitutional:      Appearance: Normal appearance.  HENT:     Head: Normocephalic.  Eyes:     Pupils: Pupils are equal, round, and reactive to light.  Cardiovascular:     Rate and Rhythm: Tachycardia present. Rhythm irregular.  Pulmonary:     Breath sounds: No rhonchi.  Abdominal:     Tenderness: There is no abdominal tenderness.  Musculoskeletal:        General: No tenderness.  Skin:    General: Skin is warm.     Capillary Refill: Capillary refill takes less than 2 seconds.  Neurological:     Mental Status: She is alert.     Comments: Patient is awake and answers questions.  Tremor  Psychiatric:     Comments: Not responding to internal stimuli.     ED Results / Procedures / Treatments   Labs (all labs ordered are listed, but only abnormal results are displayed) Labs Reviewed  PROTIME-INR - Abnormal; Notable for the following components:      Result Value   Prothrombin Time 33.4 (*)    INR 3.4 (*)    All other components within normal limits  DIGOXIN  LEVEL - Abnormal; Notable for the following components:   Digoxin Level 0.4 (*)    All other components within normal limits  COMPREHENSIVE METABOLIC PANEL - Abnormal; Notable for the following components:   Potassium 3.1 (*)    Glucose, Bld 148 (*)    Total Bilirubin 1.4 (*)    All other components within normal limits  URINALYSIS, ROUTINE W REFLEX  MICROSCOPIC - Abnormal; Notable for the following components:   Color, Urine AMBER (*)    APPearance HAZY (*)    Hgb urine dipstick MODERATE (*)    Ketones, ur 5 (*)    Protein, ur 30 (*)    Nitrite POSITIVE (*)    Leukocytes,Ua TRACE (*)    Bacteria, UA MANY (*)    All other components within normal limits  LACTIC ACID, PLASMA - Abnormal; Notable for the following components:   Lactic Acid, Venous 2.9 (*)    All other components within normal limits  LACTIC ACID, PLASMA - Abnormal; Notable for the following components:   Lactic Acid, Venous 3.4 (*)    All other components within normal limits  CBC WITH DIFFERENTIAL/PLATELET - Abnormal; Notable for the following components:   WBC 11.2 (*)    RBC 3.85 (*)    HCT 35.8 (*)    All other components within normal limits  CBG MONITORING, ED - Abnormal; Notable for the following components:   Glucose-Capillary 134 (*)    All other components within normal limits  SARS CORONAVIRUS 2 BY RT PCR (HOSPITAL ORDER, PERFORMED IN [facility])  CULTURE, BLOOD (ROUTINE X 2)  CULTURE, BLOOD (ROUTINE X 2)  URINE CULTURE  CBC WITH DIFFERENTIAL/PLATELET    EKG EKG Interpretation  Date/Time:  Friday January 01 2020 18:15:30 EDT Ventricular Rate:  178 PR Interval:    QRS Duration: 171 QT Interval:  361 QTC Calculation: 624 R Axis:   20 Text Interpretation: Confirmed by Benjiman Core [phone]) on 01/01/2020 10:50:41 PM   Radiology CT Head Wo Contrast  Result Date: 01/01/2020 CLINICAL DATA:  Altered mental status. EXAM: CT HEAD WITHOUT CONTRAST TECHNIQUE: Contiguous axial images  were obtained from the base of the skull through the vertex without intravenous contrast. COMPARISON:  None. FINDINGS: Brain: There is mild cerebral atrophy with widening of the extra-axial spaces and ventricular dilatation. There are areas of decreased attenuation within the white matter tracts of the supratentorial brain, consistent with microvascular disease changes Vascular: No hyperdense vessel or unexpected calcification. Skull: Normal. Negative for fracture or focal lesion. Sinuses/Orbits: No acute finding. Other: None. IMPRESSION: 1. Generalized cerebral atrophy. 2. No acute intracranial abnormality. Electronically Signed   By: Aram Candela M.D.   On: 01/01/2020 18:16   DG Chest Portable 1 View  Result Date: 01/01/2020 CLINICAL DATA:  Weakness EXAM: PORTABLE CHEST 1 VIEW COMPARISON:  05/17/2014 FINDINGS: Mild cardiac enlargement unchanged. Normal vascularity. Negative for heart failure. Atherosclerotic calcification aortic arch. Lungs are clear without infiltrate or effusion. IMPRESSION: No active disease. Electronically Signed   By: Marlan Palau M.D.   On: 01/01/2020 18:02    Procedures Procedures (including critical care time)  Medications Ordered in ED Medications  lactated ringers infusion ( Intravenous New Bag/Given 01/01/20 2159)  lactated ringers bolus 2,000 mL (2,000 mLs Intravenous New Bag/Given 01/01/20 2201)  cefTRIAXone (ROCEPHIN) 1 g in sodium chloride 0.9 % 100 mL IVPB (0 g Intravenous Stopped 01/01/20 2234)  lactated ringers bolus 1,000 mL (0 mLs Intravenous Stopped 01/01/20 2101)  acetaminophen (TYLENOL) tablet 650 mg (650 mg Oral Given 01/01/20 1916)    ED Course  I have reviewed the triage vital signs and the nursing notes.  Pertinent labs & imaging results that were available during my care of the patient were reviewed by me and considered in my medical decision making (see chart for details).    MDM Rules/Calculators/A&P  Patient came in  feeling weak.  Does have a fever upon arrival but good blood pressure.  Nonambulatory at baseline.  Initial heart rate mildly elevated but some of this appeared to be secondary to the fever and secondary to tremor before showing up as a faster heart rate.  Heart rate did not appear as fast as machine was reading it out.  Did have fever but not necessarily clear source of infection besides potential diarrhea.  Code sepsis not initially called.  Initial lactic acid mildly elevated.  However after initial fluid bolus did have increase in the lactate.  At this point I called the code sepsis.  Nursing states that the urine which took around 4-1/2 hours to be collected looked like it was infected.  CBC also reportedly had not made it down to the lab initially and has been recollected.    Urinalysis does show likely infection.  Has been treated with Rocephin.  Has not been hypotensive.  Lactic acid remains under 4.  Do not think necessarily this is severe sepsis/septic shock although the lactate had increased.  Will admit to internal medicine.   CRITICAL CARE Performed by: Benjiman Core Total critical care time: 30 minutes Critical care time was exclusive of separately billable procedures and treating other patients. Critical care was necessary to treat or prevent imminent or life-threatening deterioration. Critical care was time spent personally by me on the following activities: development of treatment plan with patient and/or surrogate as well as nursing, discussions with consultants, evaluation of patient's response to treatment, examination of patient, obtaining history from patient or surrogate, ordering and performing treatments and interventions, ordering and review of laboratory studies, ordering and review of radiographic studies, pulse oximetry and re-evaluation of patient's condition.   Final Clinical Impression(s) / ED Diagnoses Final diagnoses:  Persistent atrial fibrillation (HCC)    Urinary tract infection without hematuria, site unspecified    Rx / DC Orders ED Discharge Orders    None       Benjiman Core, MD 01/01/20 2252

## 2020-01-01 NOTE — H&P (Signed)
Christy Pham:[id number] DOB: [birth year] DOA: 01/01/2020     PCP: [name], MD   Outpatient Specialists:  CARDS:   Dr. Radford Pax   NEurology   Dr. Carles Collet     Patient arrived to ER on 01/01/20 at 1656 Referred by Attending Davonna Belling, MD   Patient coming from: home Lives alone,    With aide comes every day    Chief Complaint:  Chief Complaint  Patient presents with  . Diarrhea  . Atrial Fibrillation    HPI: Christy Pham is a 74 y.o. female with medical history significant of  A.fibrilation on Coumadin and digoxin, DM2, HLD, HTN, Parkinson    Presented with complaining of not feeling quite right for past 1 hour or so.  She had diarrhea hallucinating for the past 1 week.  No associated fevers or nausea or vomiting.  Patient was significantly diaphoretic.  Patient reports that she is seeing people for not there.  She has no headache.  No prior history of hallucinations.  Patient has had a few falls but unsure how it happened. She has history of frequent urinary tract infection.   Infectious risk factors:  Reports  N/V/Diarrhea   severe fatigue    Has NOt been vaccinated against COVID    Initial COVID TEST  NEGATIVE   Lab Results  Component Value Date   [place] NEGATIVE 01/01/2020    Regarding pertinent Chronic problems:   Parkinson on Stalevo and Amantadine   Hyperlipidemia - on statins Zocor Lipid Panel     Component Value Date/Time   CHOL  12/03/2009 0515    156        ATP III CLASSIFICATION:  <200     mg/dL   Desirable  200-239  mg/dL   Borderline High  >=240    mg/dL   High          TRIG 79 12/03/2009 0515   HDL 57 12/03/2009 0515   CHOLHDL 2.7 12/03/2009 0515   VLDL 16 12/03/2009 0515   LDLCALC  12/03/2009 0515    83        Total Cholesterol/HDL:CHD Risk Coronary Heart Disease Risk Table                     Men   Women  1/2 Average Risk   3.4   3.3  Average Risk       5.0   4.4  2 X Average Risk   9.6   7.1  3 X Average  Risk  23.4   11.0        Use the calculated Patient Ratio above and the CHD Risk Table to determine the patient's CHD Risk.        ATP III CLASSIFICATION (LDL):  <100     mg/dL   Optimal  100-129  mg/dL   Near or Above                    Optimal  130-159  mg/dL   Borderline  160-189  mg/dL   High  >190     mg/dL   Very High   Chronic diastolic CHF last echogram in July 2020 Normal LV function; mild LVH; severe LAE   HTN on Metoprolol    DM 2 -  Lab Results  Component Value Date   HGBA1C (H) 12/03/2009    5.8 (NOTE)  According to the ADA Clinical Practice Recommendations for 2011, when HbA1c is used as a screening test:   >=6.5%   Diagnostic of Diabetes Mellitus           (if abnormal result  is confirmed)  5.7-6.4%   Increased risk of developing Diabetes Mellitus  References:Diagnosis and Classification of Diabetes Mellitus,Diabetes TKPT,4656,81(EXNTZ 1):S62-S69 and Standards of Medical Care in         Diabetes - 2011,Diabetes GYFV,4944,96  (Suppl 1):S11-S61.   on  PO meds     obesity-   BMI Readings from Last 1 Encounters:  01/01/20 34.87 kg/m       A. Fib -  - CHA2DS2 vas score    5          current  on anticoagulation with Coumadin           -  Rate control:  Currently controlled with  Metoprolol, and digoxin     While in ER: Initially came in with heart rate up to 178 Given IV fluids was found to be febrile found to have UTI and started on Rocephin Patient does not have hypotension lactic acid remains under 4 Patient met parameters for sepsis Aggressive IV fluid resuscitation started No evidence of septic shock     Hospitalist was called for admission for A.fib w RVR and Sepsis due to UTI  The following Work up has been ordered so far:  Orders Placed This Encounter  Procedures  . SARS Coronavirus 2 by RT PCR (hospital order, performed in [facility] hospital lab) Nasopharyngeal  Nasopharyngeal Swab  . Culture, blood (routine x 2)  . Urine culture  . DG Chest Portable 1 View  . CT Head Wo Contrast  . Protime-INR  . Digoxin level  . CBC with Differential  . Comprehensive metabolic panel  . Urinalysis, Routine w reflex microscopic  . Lactic acid, plasma  . CBC with Differential/Platelet  . In and Out Cath  . DO NOT delay antibiotics if unable to obtain blood culture.  . Code Sepsis activation.  This occurs automatically when order is signed and prioritizes pharmacy, lab, and radiology services for STAT collections and interventions.  If CHL downtime, call [facility] [phone]) to activate Code Sepsis.  . Consult to hospitalist  ALL PATIENTS BEING ADMITTED/HAVING PROCEDURES NEED COVID-19 SCREENING  . CBG monitoring, ED  . ED EKG  . Insert 2nd peripheral IV if not already present.    Following Medications were ordered in ER: Medications  lactated ringers infusion ( Intravenous New Bag/Given 01/01/20 2159)  cefTRIAXone (ROCEPHIN) 1 g in sodium chloride 0.9 % 100 mL IVPB (0 g Intravenous Stopped 01/01/20 2234)  lactated ringers bolus 1,000 mL (0 mLs Intravenous Stopped 01/01/20 2101)  acetaminophen (TYLENOL) tablet 650 mg (650 mg Oral Given 01/01/20 1916)  lactated ringers bolus 2,000 mL (2,000 mLs Intravenous New Bag/Given 01/01/20 2201)        Consult Orders  (From admission, onward)         Start     Ordered   01/01/20 2250  Consult to hospitalist  ALL PATIENTS BEING ADMITTED/HAVING PROCEDURES NEED COVID-19 SCREENING  Once       Comments: ALL PATIENTS BEING ADMITTED/HAVING PROCEDURES NEED COVID-19 SCREENING  Provider:  (Not yet assigned)  Question Answer Comment  Place call to: [facility]   Reason for Consult Admit      01/01/20 2249          Significant initial  Findings: Abnormal Labs Reviewed  PROTIME-INR - Abnormal; Notable for the following components:      Result Value   Prothrombin Time 33.4 (*)    INR 3.4 (*)    All other components  within normal limits  DIGOXIN LEVEL - Abnormal; Notable for the following components:   Digoxin Level 0.4 (*)    All other components within normal limits  COMPREHENSIVE METABOLIC PANEL - Abnormal; Notable for the following components:   Potassium 3.1 (*)    Glucose, Bld 148 (*)    Total Bilirubin 1.4 (*)    All other components within normal limits  URINALYSIS, ROUTINE W REFLEX MICROSCOPIC - Abnormal; Notable for the following components:   Color, Urine AMBER (*)    APPearance HAZY (*)    Hgb urine dipstick MODERATE (*)    Ketones, ur 5 (*)    Protein, ur 30 (*)    Nitrite POSITIVE (*)    Leukocytes,Ua TRACE (*)    Bacteria, UA MANY (*)    All other components within normal limits  LACTIC ACID, PLASMA - Abnormal; Notable for the following components:   Lactic Acid, Venous 2.9 (*)    All other components within normal limits  LACTIC ACID, PLASMA - Abnormal; Notable for the following components:   Lactic Acid, Venous 3.4 (*)    All other components within normal limits  CBC WITH DIFFERENTIAL/PLATELET - Abnormal; Notable for the following components:   WBC 11.2 (*)    RBC 3.85 (*)    HCT 35.8 (*)    All other components within normal limits  CBG MONITORING, ED - Abnormal; Notable for the following components:   Glucose-Capillary 134 (*)    All other components within normal limits     Otherwise labs showing:    Recent Labs  Lab 01/01/20 1830  NA 140  K 3.1*  CO2 25  GLUCOSE 148*  BUN 13  CREATININE 0.80  CALCIUM 9.1    Cr  stable,   Lab Results  Component Value Date   CREATININE 0.80 01/01/2020   CREATININE 0.69 05/19/2014   CREATININE 0.76 05/18/2014    Recent Labs  Lab 01/01/20 1830  AST 28  ALT 22  ALKPHOS 55  BILITOT 1.4*  PROT 6.9  ALBUMIN 3.8   Lab Results  Component Value Date   CALCIUM 9.1 01/01/2020      WBC      Component Value Date/Time   WBC 11.2 (H) 01/01/2020 2124   ANC    Component Value Date/Time   NEUTROABS 7.3  01/01/2020 2124   ALC No components found for: LYMPHAB    Plt: Lab Results  Component Value Date   PLT 268 01/01/2020    Lactic Acid, Venous    Component Value Date/Time   LATICACIDVEN 3.4 (HH) 01/01/2020 2004     Procalcitonin  Ordered   COVID-19 Labs  No results for input(s): DDIMER, FERRITIN, LDH, CRP in the last 72 hours.  Lab Results  Component Value Date   SARSCOV2NAA NEGATIVE 01/01/2020     HG/HCT   Stable,     Component Value Date/Time   HGB 12.0 01/01/2020 2124   HCT 35.8 (L) 01/01/2020 2124    No results for input(s): LIPASE, AMYLASE in the last 168 hours. No results for input(s): AMMONIA in the last 168 hours.   Troponin ordered     ECG: Ordered Personally reviewed by me showing: HR : 70's Rhythm: A.fib   poor baseline due to tremor     UA  evidence of UTI    Urine analysis:    Component Value Date/Time   COLORURINE AMBER (A) 01/01/2020 2124   APPEARANCEUR HAZY (A) 01/01/2020 2124   LABSPEC 1.019 01/01/2020 2124   PHURINE 5.0 01/01/2020 2124   GLUCOSEU NEGATIVE 01/01/2020 2124   HGBUR MODERATE (A) 01/01/2020 2124   BILIRUBINUR NEGATIVE 01/01/2020 2124   KETONESUR 5 (A) 01/01/2020 2124   PROTEINUR 30 (A) 01/01/2020 2124   UROBILINOGEN 0.2 05/17/2014 1221   NITRITE POSITIVE (A) 01/01/2020 2124   LEUKOCYTESUR TRACE (A) 01/01/2020 2124       Ordered  CT HEAD  NON acute  CXR - NON acute    ED Triage Vitals  Enc Vitals Group     BP 01/01/20 1745 (!) 123/94     Pulse Rate 01/01/20 1745 (!) 114     Resp 01/01/20 1745 12     Temp 01/01/20 1745 (!) 101 F (38.3 C)     Temp Source 01/01/20 1745 Rectal     SpO2 01/01/20 1708 95 %     Weight 01/01/20 1718 200 lb (90.7 kg)     Height 01/01/20 1718 5' 3.5" (1.613 m)     Head Circumference --      Peak Flow --      Pain Score --      Pain Loc --      Pain Edu? --      Excl. in [place]? --   TMAX(24)@       Latest  Blood pressure 127/75, pulse 92, temperature (!) 101 F (38.3 C),  temperature source Rectal, resp. rate (!) 28, height 5' 3.5" (1.613 m), weight 90.7 kg, SpO2 94 %.      Review of Systems:    Pertinent positives include:   Fevers, chills, fatigue,  Constitutional:  No weight loss, night sweats,  weight loss  HEENT:  No headaches, Difficulty swallowing,Tooth/dental problems,Sore throat,  No sneezing, itching, ear ache, nasal congestion, post nasal drip,  Cardio-vascular:  No chest pain, Orthopnea, PND, anasarca, dizziness, palpitations.no Bilateral lower extremity swelling  GI:  No heartburn, indigestion, abdominal pain, nausea, vomiting, diarrhea, change in bowel habits, loss of appetite, melena, blood in stool, hematemesis Resp:  no shortness of breath at rest. No dyspnea on exertion, No excess mucus, no productive cough, No non-productive cough, No coughing up of blood.No change in color of mucus.No wheezing. Skin:  no rash or lesions. No jaundice GU:  no dysuria, change in color of urine, no urgency or frequency. No straining to urinate.  No flank pain.  Musculoskeletal:  No joint pain or no joint swelling. No decreased range of motion. No back pain.  Psych:  No change in mood or affect. No depression or anxiety. No memory loss.  Neuro: no localizing neurological complaints, no tingling, no weakness, no double vision, no gait abnormality, no slurred speech, no confusion  All systems reviewed and apart from Isabella all are negative  Past Medical History:   Past Medical History:  Diagnosis Date  . Chronic atrial fibrillation ([place])   . Depression   . Diabetes ([place])   . Hyperlipidemia    dx circa 2008  . Hypertension    dx circa 2008  . Mitral regurgitation    mild to moderate by echo 2013  . Osteoarthritis    "L knee primarily" (05/17/2014)  . Parkinson's disease    dx circa 1990 by Dr. Doy Mince       Past Surgical History:  Procedure Laterality Date  .  COLONOSCOPY     2006 by Reegan Ada (clear per pt)  . TONSILLECTOMY  ~ 1950    . TUBAL LIGATION  ~ 1983    Social History:  Ambulatory wheelchair bound,      reports that she has never smoked. She has never used smokeless tobacco. She reports current alcohol use of about 3.0 standard drinks of alcohol per week. She reports that she does not use drugs.   Family History:   Family History  Problem Relation Age of Onset  . Coronary artery disease Father 23       s/p CABG  . Colon cancer Father 32       s/p resection  . Restless legs syndrome Father   . Dementia Mother     Allergies: No Known Allergies   Prior to Admission medications   Medication Sig Start Date End Date Taking? Authorizing Provider  ALPRAZolam (XANAX) 0.25 MG tablet Take 0.25 mg by mouth daily. FOR ANXIETY 06/01/14   [provider]  amantadine (SYMMETREL) 100 MG capsule TAKE ONE CAPSULE BY MOUTH TWICE A DAY 04/14/18   Tat, Rebecca S, DO  carbidopa-levodopa-entacapone (STALEVO) 31.25-125-200 MG tablet 2 tablets at 7 am, 1 at 11 am, 2 at 4 pm 02/13/18   Tat, Eustace Quail, DO  digoxin (LANOXIN) 0.125 MG tablet Take 1 tablet (0.125 mg total) by mouth daily. Please make overdue appt with Dr. Radford Pax before anymore refills. 1st attempt 12/21/19   Sueanne Margarita, MD  FLUoxetine (PROZAC) 40 MG capsule Take 40 mg by mouth daily.    [provider]  metFORMIN (GLUCOPHAGE-XR) 500 MG 24 hr tablet Take 500 mg by mouth daily.  06/17/17   [provider]  metoprolol tartrate (LOPRESSOR) 25 MG tablet Take 0.5 tablets (12.5 mg total) by mouth 2 (two) times daily. 05/19/14   Elgergawy, Silver Huguenin, MD  Multiple Vitamin (MULTIVITAMIN) tablet Take 1 tablet by mouth daily.    [provider]  Multiple Vitamins-Minerals (EYE VITAMINS PO) Take 2 tablets by mouth daily.     [provider]  simvastatin (ZOCOR) 40 MG tablet Take 40 mg by mouth every evening.    [provider]  warfarin (COUMADIN) 5 MG tablet Take 1 tablet (5 mg total) by mouth daily. Take 1 (5 mg) tablet  by mouth daily. Follow with your PCP regarding dosing and INR monitoring. 05/19/14   Elgergawy, Silver Huguenin, MD   Physical Exam: Vitals with BMI 01/01/2020 01/01/2020 01/01/2020  Height - - -  Weight - - -  BMI - - -  Systolic 970 263 785  Diastolic 75 84 78  Pulse 92 93 69     1. General:  in No  Acute distress    Chronically ill -appearing 2. Psychological: Alert and  Oriented 3. Head/ENT:     Dry Mucous Membranes                          Head Non traumatic, neck supple                            Poor Dentition 4. SKIN:   decreased Skin turgor,  Skin clean Dry and intact no rash 5. Heart: Regular rate and rhythm no  Murmur, no Rub or gallop 6. Lungs no wheezes or crackles   7. Abdomen: Soft,  non-tender, Non distended  Obese bowel sounds present 8. Lower extremities: no clubbing,  cyanosis, no  edema 9. Neurologically Grossly intact, moving all 4 extremities equally tremulous 10. MSK: Normal range of motion   All other LABS:     Recent Labs  Lab 01/01/20 2124  WBC 11.2*  NEUTROABS 7.3  HGB 12.0  HCT 35.8*  MCV 93.0  PLT 268     Recent Labs  Lab 01/01/20 1830  NA 140  K 3.1*  CL 103  CO2 25  GLUCOSE 148*  BUN 13  CREATININE 0.80  CALCIUM 9.1     Recent Labs  Lab 01/01/20 1830  AST 28  ALT 22  ALKPHOS 55  BILITOT 1.4*  PROT 6.9  ALBUMIN 3.8       Cultures:    Component Value Date/Time   SDES URINE, RANDOM 05/17/2014 1221   SPECREQUEST NONE 05/17/2014 1221   CULT  05/17/2014 1221    ESCHERICHIA COLI Performed at [facility] 05/20/2014 FINAL 05/17/2014 1221     Radiological Exams on Admission: CT Head Wo Contrast  Result Date: 01/01/2020 CLINICAL DATA:  Altered mental status. EXAM: CT HEAD WITHOUT CONTRAST TECHNIQUE: Contiguous axial images were obtained from the base of the skull through the vertex without intravenous contrast. COMPARISON:  None. FINDINGS: Brain: There is mild cerebral atrophy with widening of the  extra-axial spaces and ventricular dilatation. There are areas of decreased attenuation within the white matter tracts of the supratentorial brain, consistent with microvascular disease changes Vascular: No hyperdense vessel or unexpected calcification. Skull: Normal. Negative for fracture or focal lesion. Sinuses/Orbits: No acute finding. Other: None. IMPRESSION: 1. Generalized cerebral atrophy. 2. No acute intracranial abnormality. Electronically Signed   By: Virgina Norfolk M.D.   On: 01/01/2020 18:16   DG Chest Portable 1 View  Result Date: 01/01/2020 CLINICAL DATA:  Weakness EXAM: PORTABLE CHEST 1 VIEW COMPARISON:  05/17/2014 FINDINGS: Mild cardiac enlargement unchanged. Normal vascularity. Negative for heart failure. Atherosclerotic calcification aortic arch. Lungs are clear without infiltrate or effusion. IMPRESSION: No active disease. Electronically Signed   By: Franchot Gallo M.D.   On: 01/01/2020 18:02    Chart has been reviewed    Assessment/Plan   74 y.o. female with medical history significant of  A.fibrilation on Coumadin and digoxin, DM2, HLD, HTN, Parkinson Admitted for severe sepsis due to UTi and A.fib with RVR  Present on Admission: . severe Sepsis ([place]) -   -SIRS criteria met with elevated white blood cell count,       Component Value Date/Time   WBC 11.2 (H) 01/01/2020 2124   LYMPHSABS 3.0 01/01/2020 2124     fever  Up to 101 RR >20 Today's Vitals   01/01/20 1930 01/01/20 2000 01/01/20 2130 01/01/20 2230  BP: 118/68 111/78 123/84 127/75  Pulse: 65 69 93 92  Resp: (!) 24 (!) 28 (!) 23 (!) 28  Temp:      TempSrc:      SpO2: 97% 97% 98% 94%  Weight:      Height:       Body mass index is 34.87 kg/m.    The recent clinical data is shown below. Vitals:   01/01/20 1930 01/01/20 2000 01/01/20 2130 01/01/20 2230  BP: 118/68 111/78 123/84 127/75  Pulse: 65 69 93 92  Resp: (!) 24 (!) 28 (!) 23 (!) 28  Temp:      TempSrc:      SpO2: 97% 97% 98% 94%  Weight:       Height:         -  Most likely source being:  Urinary     Patient meeting criteria for Severe sepsis with    evidence of end organ damage/organ dysfunction such as   elevated lactic acid >2     Component Value Date/Time   LATICACIDVEN 3.4 ([place]) 01/01/2020 8676    acute metabolic encephalopathy    - Obtain serial lactic acid and procalcitonin level.  - Initiated IV antibiotics   - await results of blood and urine culture  - Rehydrate aggressively        30cc/kg fluid   12:56 AM  . ATRIAL FIBRILLATION WITH RAPID VENTRICULAR RESPONSE -upon review of EKG it seems that patient is not tachycardic EKG is read erroneously at 170.  Repeat EKG and continue to monitor. Continue digoxin.  Continue beta-blocker blood pressure Continue Coumadin per pharmacy  . Chronic systolic/diastolic heart failure (HCC) -chronic appears to be somewhat on the dry side we will gently rehydrate   . Dehydration -rehydrate and follow fluid status   . Essential hypertension -resume metoprolol if able to and blood pressure allows being mindful sepsis   need to reassess in the morning   . Parkinsonism ([place]) -continue home medications would hold amantadine for right now as this can induce hallucinations will need to further discuss with neurology in a.m.   Marland Kitchen Lower urinary tract infectious disease -  - treat with Rocephin        await results of urine culture and adjust antibiotic coverage as needed  . Hypokalemia - - will replace and repeat in AM,  check magnesium level and replace as needed  . Acute metabolic encephalopathy -in the setting of UTI and dehydration we will gently rehydrate him follow mental status.  . Hallucinations hold amantadine for now  Dm 2-  - Order Sensitive  SSI     -  check TSH and HgA1C  - Hold by mouth medications    Other plan as per orders.  DVT prophylaxis:  On coumadin      Code Status:   DNR/DNI  as per patient     I had personally discussed CODE STATUS with patient      Family Communication:   Family not at  Bedside   Disposition Plan:   To home once workup is complete and patient is stable   Following barriers for discharge:                            Electrolytes corrected                                                           Pain controlled with PO medications                               Afebrile, white count improving able to transition to PO antibiotics                                                         Will likely need home health,  Would benefit from PT/OT eval prior to DC  Ordered                   Swallow eval - SLP ordered                    Transition of care consulted                                      Consults called: none  Admission status:  ED Disposition    ED Disposition Condition [place]: [place] [[id number]]  Level of Care: Progressive [102]  Admit to Progressive based on following criteria: MULTISYSTEM THREATS such as stable sepsis, metabolic/electrolyte imbalance with or without encephalopathy that is responding to early treatment.  May admit patient to [facility] or [facility] if equivalent level of care is available:: No  Covid Evaluation: Confirmed COVID Negative  Diagnosis: Sepsis [facility]) [[id number]]  Admitting Physician: Toy Baker [[id number]]  Attending Physician: Toy Baker [[id number]]  Estimated length of stay: past midnight tomorrow  Certification:: I certify this patient will need inpatient services for at least 2 midnights           inpatient     I Expect 2 midnight stay secondary to severity of patient's current illness need for inpatient interventions justified by the following:  hemodynamic instability despite optimal treatment (tachycardia )   Severe lab/radiological/exam abnormalities including:    A.fib w rvr and extensive comorbidities including:    DM2    CHF .   Marland Kitchen Chronic  anticoagulation  That are currently affecting medical management.   I expect  patient to be hospitalized for 2 midnights requiring inpatient medical care.  Patient is at high risk for adverse outcome (such as loss of life or disability) if not treated.  Indication for inpatient stay as follows:  Severe change from baseline regarding mental status Hemodynamic instability despite maximal medical therapy,     Need for IV antibiotics, IV fluids     Level of care       SDU tele indefinitely please discontinue once patient no longer qualifies COVID-19 Labs    Lab Results  Component Value Date   [place] NEGATIVE 01/01/2020     Precautions: admitted as   Covid Negative    PPE: Used by the provider:   P100  eye Goggles,  Gloves    Shalita Notte 01/01/2020, 12:20 AM    [facility]     after 2 AM please page floor coverage PA If 7AM-7PM, please contact the day team taking care of the patient using [email or web address]   Patient was evaluated in the context of the global COVID-19 pandemic, which necessitated consideration that the patient might be at risk for infection with the SARS-CoV-2 virus that causes COVID-19. Institutional protocols and algorithms that pertain to the evaluation of patients at risk for COVID-19 are in a state of rapid change based on information released by regulatory bodies including the CDC and federal and state organizations. These policies and algorithms were followed during the patient's care.

## 2020-01-02 DIAGNOSIS — E876 Hypokalemia: Secondary | ICD-10-CM | POA: Diagnosis present

## 2020-01-02 DIAGNOSIS — R443 Hallucinations, unspecified: Secondary | ICD-10-CM | POA: Diagnosis present

## 2020-01-02 DIAGNOSIS — G9341 Metabolic encephalopathy: Secondary | ICD-10-CM | POA: Diagnosis present

## 2020-01-02 LAB — CBC WITH DIFFERENTIAL/PLATELET
Abs Immature Granulocytes: 0.03 10*3/uL (ref 0.00–0.07)
Basophils Absolute: 0.1 10*3/uL (ref 0.0–0.1)
Basophils Relative: 1 %
Eosinophils Absolute: 0.3 10*3/uL (ref 0.0–0.5)
Eosinophils Relative: 3 %
HCT: 34.1 % — ABNORMAL LOW (ref 36.0–46.0)
Hemoglobin: 11.4 g/dL — ABNORMAL LOW (ref 12.0–15.0)
Immature Granulocytes: 0 %
Lymphocytes Relative: 40 %
Lymphs Abs: 3.9 10*3/uL (ref 0.7–4.0)
MCH: 31.5 pg (ref 26.0–34.0)
MCHC: 33.4 g/dL (ref 30.0–36.0)
MCV: 94.2 fL (ref 80.0–100.0)
Monocytes Absolute: 0.7 10*3/uL (ref 0.1–1.0)
Monocytes Relative: 7 %
Neutro Abs: 4.7 10*3/uL (ref 1.7–7.7)
Neutrophils Relative %: 49 %
Platelets: 234 10*3/uL (ref 150–400)
RBC: 3.62 MIL/uL — ABNORMAL LOW (ref 3.87–5.11)
RDW: 14.6 % (ref 11.5–15.5)
WBC: 9.6 10*3/uL (ref 4.0–10.5)
nRBC: 0 % (ref 0.0–0.2)

## 2020-01-02 LAB — COMPREHENSIVE METABOLIC PANEL
ALT: 20 U/L (ref 0–44)
AST: 20 U/L (ref 15–41)
Albumin: 3.1 g/dL — ABNORMAL LOW (ref 3.5–5.0)
Alkaline Phosphatase: 45 U/L (ref 38–126)
Anion gap: 9 (ref 5–15)
BUN: 10 mg/dL (ref 8–23)
CO2: 27 mmol/L (ref 22–32)
Calcium: 8.6 mg/dL — ABNORMAL LOW (ref 8.9–10.3)
Chloride: 107 mmol/L (ref 98–111)
Creatinine, Ser: 0.67 mg/dL (ref 0.44–1.00)
GFR calc Af Amer: >60 mL/min (ref >60)
GFR calc non Af Amer: >60 mL/min (ref >60)
Glucose, Bld: 114 mg/dL — ABNORMAL HIGH (ref 70–99)
Potassium: 3.8 mmol/L (ref 3.5–5.1)
Sodium: 143 mmol/L (ref 135–145)
Total Bilirubin: 0.9 mg/dL (ref 0.3–1.2)
Total Protein: 5.8 g/dL — ABNORMAL LOW (ref 6.5–8.1)

## 2020-01-02 LAB — TROPONIN I (HIGH SENSITIVITY)
Troponin I (High Sensitivity): 14 ng/L (ref <18)
Troponin I (High Sensitivity): 14 ng/L (ref <18)

## 2020-01-02 LAB — CBG MONITORING, ED
Glucose-Capillary: 116 mg/dL — ABNORMAL HIGH (ref 70–99)
Glucose-Capillary: 116 mg/dL — ABNORMAL HIGH (ref 70–99)
Glucose-Capillary: 127 mg/dL — ABNORMAL HIGH (ref 70–99)
Glucose-Capillary: 124 mg/dL — ABNORMAL HIGH (ref 70–99)
Glucose-Capillary: 139 mg/dL — ABNORMAL HIGH (ref 70–99)

## 2020-01-02 LAB — LACTIC ACID, PLASMA
Lactic Acid, Venous: 2 mmol/L (ref 0.5–1.9)
Lactic Acid, Venous: 1.2 mmol/L (ref 0.5–1.9)

## 2020-01-02 LAB — TSH: TSH: 2.402 u[IU]/mL (ref 0.350–4.500)

## 2020-01-02 LAB — HEMOGLOBIN A1C
Hgb A1c MFr Bld: 6.1 % — ABNORMAL HIGH (ref 4.8–5.6)
Mean Plasma Glucose: 128.37 mg/dL

## 2020-01-02 LAB — PROTIME-INR
INR: 4.1 (ref 0.8–1.2)
Prothrombin Time: 38.5 seconds — ABNORMAL HIGH (ref 11.4–15.2)

## 2020-01-02 LAB — PROCALCITONIN: Procalcitonin: <0.1 ng/mL

## 2020-01-02 LAB — GLUCOSE, CAPILLARY: Glucose-Capillary: 113 mg/dL — ABNORMAL HIGH (ref 70–99)

## 2020-01-02 LAB — PHOSPHORUS: Phosphorus: 3 mg/dL (ref 2.5–4.6)

## 2020-01-02 LAB — BRAIN NATRIURETIC PEPTIDE: B Natriuretic Peptide: 70.4 pg/mL (ref 0.0–100.0)

## 2020-01-02 LAB — MAGNESIUM
Magnesium: 1.8 mg/dL (ref 1.7–2.4)
Magnesium: 1.9 mg/dL (ref 1.7–2.4)

## 2020-01-02 MED ORDER — ROSUVASTATIN CALCIUM 20 MG PO TABS
20.0000 mg | ORAL_TABLET | Freq: Every day | ORAL | Status: DC
  Administered 2020-01-02 – 2020-01-04 (×3): 20 mg via ORAL
  Filled 2020-01-02 (×4): qty 1

## 2020-01-02 MED ORDER — SODIUM CHLORIDE 0.9 % IV SOLN: INTRAVENOUS | Status: DC

## 2020-01-02 MED ORDER — POTASSIUM CHLORIDE CRYS ER 20 MEQ PO TBCR
20.0000 meq | EXTENDED_RELEASE_TABLET | Freq: Once | ORAL | Status: AC
  Administered 2020-01-02: 20 meq via ORAL
  Filled 2020-01-02: qty 1

## 2020-01-02 MED ORDER — POLYETHYLENE GLYCOL 3350 17 G PO PACK: 17.0000 g | PACK | Freq: Every day | ORAL | Status: DC | PRN

## 2020-01-02 MED ORDER — CARBIDOPA-LEVODOPA 25-100 MG PO TABS
1.0000 | ORAL_TABLET | ORAL | Status: DC
  Administered 2020-01-02 – 2020-01-05 (×4): 1 via ORAL
  Filled 2020-01-02 (×5): qty 1

## 2020-01-02 MED ORDER — FLUOXETINE HCL 20 MG PO CAPS
20.0000 mg | ORAL_CAPSULE | Freq: Every day | ORAL | Status: DC
  Administered 2020-01-02 – 2020-01-05 (×4): 20 mg via ORAL
  Filled 2020-01-02 (×5): qty 1

## 2020-01-02 MED ORDER — ENTACAPONE 200 MG PO TABS
200.0000 mg | ORAL_TABLET | ORAL | Status: DC
  Administered 2020-01-02 – 2020-01-05 (×4): 200 mg via ORAL
  Filled 2020-01-02 (×4): qty 1

## 2020-01-02 MED ORDER — INSULIN ASPART 100 UNIT/ML ~~LOC~~ SOLN
0.0000 [IU] | SUBCUTANEOUS | Status: DC
  Administered 2020-01-02 – 2020-01-03 (×4): 1 [IU] via SUBCUTANEOUS
  Administered 2020-01-03: 2 [IU] via SUBCUTANEOUS
  Administered 2020-01-03 (×2): 1 [IU] via SUBCUTANEOUS
  Administered 2020-01-03: 2 [IU] via SUBCUTANEOUS
  Administered 2020-01-04 (×3): 1 [IU] via SUBCUTANEOUS
  Administered 2020-01-04: 2 [IU] via SUBCUTANEOUS
  Administered 2020-01-05: 1 [IU] via SUBCUTANEOUS
  Administered 2020-01-05: 2 [IU] via SUBCUTANEOUS
  Administered 2020-01-05: 1 [IU] via SUBCUTANEOUS
  Filled 2020-01-02: qty 0.09

## 2020-01-02 MED ORDER — CARBIDOPA-LEVODOPA 25-100 MG PO TABS
2.0000 | ORAL_TABLET | ORAL | Status: DC
  Administered 2020-01-02 – 2020-01-05 (×6): 2 via ORAL
  Filled 2020-01-02 (×5): qty 2

## 2020-01-02 MED ORDER — ENTACAPONE 200 MG PO TABS
400.0000 mg | ORAL_TABLET | ORAL | Status: DC
  Administered 2020-01-02 – 2020-01-05 (×6): 400 mg via ORAL
  Filled 2020-01-02 (×7): qty 2

## 2020-01-02 MED ORDER — POTASSIUM CHLORIDE 10 MEQ/100ML IV SOLN
10.0000 meq | INTRAVENOUS | Status: AC
  Administered 2020-01-02 (×4): 10 meq via INTRAVENOUS
  Filled 2020-01-02 (×4): qty 100

## 2020-01-02 MED ORDER — CARBIDOPA-LEVODOPA-ENTACAPONE 31.25-125-200 MG PO TABS: 1.0000 | ORAL_TABLET | Freq: Three times a day (TID) | ORAL | Status: DC

## 2020-01-02 MED ORDER — ACETAMINOPHEN 325 MG PO TABS: 650.0000 mg | ORAL_TABLET | Freq: Four times a day (QID) | ORAL | Status: DC | PRN

## 2020-01-02 MED ORDER — HYDROCODONE-ACETAMINOPHEN 5-325 MG PO TABS: 1.0000 | ORAL_TABLET | ORAL | Status: DC | PRN

## 2020-01-02 MED ORDER — SENNOSIDES-DOCUSATE SODIUM 8.6-50 MG PO TABS: 2.0000 | ORAL_TABLET | Freq: Every evening | ORAL | Status: DC | PRN

## 2020-01-02 MED ORDER — ONDANSETRON HCL 4 MG PO TABS: 4.0000 mg | ORAL_TABLET | Freq: Four times a day (QID) | ORAL | Status: DC | PRN

## 2020-01-02 MED ORDER — DOCUSATE SODIUM 100 MG PO CAPS
100.0000 mg | ORAL_CAPSULE | Freq: Two times a day (BID) | ORAL | Status: DC
  Administered 2020-01-02 – 2020-01-05 (×7): 100 mg via ORAL
  Filled 2020-01-02 (×7): qty 1

## 2020-01-02 MED ORDER — ONDANSETRON HCL 4 MG/2ML IJ SOLN: 4.0000 mg | Freq: Four times a day (QID) | INTRAMUSCULAR | Status: DC | PRN

## 2020-01-02 MED ORDER — SODIUM CHLORIDE 0.9% FLUSH
3.0000 mL | Freq: Two times a day (BID) | INTRAVENOUS | Status: DC
  Administered 2020-01-02 – 2020-01-04 (×7): 3 mL via INTRAVENOUS

## 2020-01-02 MED ORDER — SODIUM CHLORIDE 0.9 % IV SOLN
1.0000 g | INTRAVENOUS | Status: DC
  Administered 2020-01-02 – 2020-01-04 (×3): 1 g via INTRAVENOUS
  Filled 2020-01-02: qty 10
  Filled 2020-01-02: qty 1
  Filled 2020-01-02 (×2): qty 10

## 2020-01-02 MED ORDER — ACETAMINOPHEN 650 MG RE SUPP: 650.0000 mg | Freq: Four times a day (QID) | RECTAL | Status: DC | PRN

## 2020-01-02 MED ORDER — WARFARIN - PHARMACIST DOSING INPATIENT: Freq: Every day | Status: DC

## 2020-01-02 MED ORDER — METOPROLOL TARTRATE 5 MG/5ML IV SOLN
5.0000 mg | INTRAVENOUS | Status: DC | PRN
  Filled 2020-01-02: qty 5

## 2020-01-02 MED ORDER — SODIUM CHLORIDE 0.9 % IV SOLN: INTRAVENOUS | Status: AC

## 2020-01-02 MED ORDER — DIGOXIN 125 MCG PO TABS
0.1250 mg | ORAL_TABLET | Freq: Every day | ORAL | Status: DC
  Administered 2020-01-02: 0.125 mg via ORAL
  Filled 2020-01-02: qty 1

## 2020-01-02 MED ORDER — METOPROLOL TARTRATE 25 MG PO TABS
12.5000 mg | ORAL_TABLET | Freq: Two times a day (BID) | ORAL | Status: DC
  Administered 2020-01-02 (×2): 12.5 mg via ORAL
  Filled 2020-01-02 (×3): qty 1

## 2020-01-02 NOTE — Evaluation (Signed)
Physical Therapy Evaluation Patient Details Name: Christy Pham MRN: [id number] DOB: [birth year] Today's Date: 01/02/2020   History of Present Illness  74 year old with history of A. fib on Coumadin, DM2, HLD, Parkinson's, essential hypertension presents for worsening confusion at home, unsteady.  In the ER noted to have sepsis secondary to urinary tract infection with A. fib with RVR  Clinical Impression  Pt admitted with above diagnosis (seen in ED).  Pt currently with functional limitations due to the deficits listed below (see PT Problem List). Pt will benefit from skilled PT to increase their independence and safety with mobility to allow discharge to the venue listed below.  Pt only transfers at baseline.  Pt reports sleeping in lift chair and stand pivot 180* to manual w/c for mobility.  Pt unable to stand today and required significant assist for bed mobility.  Recommending SNF at this time however pt hopeful for d/c home.    Follow Up Recommendations SNF;Supervision/Assistance - 24 hour    Equipment Recommendations  None recommended by PT    Recommendations for Other Services       Precautions / Restrictions Precautions Precautions: Fall Precaution Comments: tremulous extremities and trunk      Mobility  Bed Mobility Overal bed mobility: Needs Assistance Bed Mobility: Supine to Sit;Sit to Supine     Supine to sit: Total assist;+2 for physical assistance Sit to supine: Total assist;+2 for physical assistance   General bed mobility comments: pt able to move legs over part way to EOB however required assist thereafter, HR flucuating throughout session however RN reports due to truncal motion  Transfers                 General transfer comment: unable today  Ambulation/Gait                Stairs            Wheelchair Mobility    Modified Rankin (Stroke Patients Only)       Balance Overall balance assessment: Needs assistance;History of  Falls Sitting-balance support: Bilateral upper extremity supported Sitting balance-Leahy Scale: Zero Sitting balance - Comments: pt very briefly able to to sit EOB without external assist however does require UE support, mostly min-mod for trunk support                                     Pertinent Vitals/Pain Pain Assessment: No/denies pain    Home Living Family/patient expects to be discharged to:: Private residence Living Arrangements: Spouse/significant other Available Help at Discharge: Family;Personal care attendant Type of Home: House Home Access: Ramped entrance     Home Layout: One level Home Equipment: Environmental consultant - 2 wheels;Wheelchair - Engineer, technical sales - power      Prior Function Level of Independence: Needs assistance   Gait / Transfers Assistance Needed: sleeps in lift chair, stands and turns 180* to manual w/c, does not ambulate, also has power w/c but typically uses manual w/c           Hand Dominance        Extremity/Trunk Assessment        Lower Extremity Assessment Lower Extremity Assessment: Generalized weakness;RLE deficits/detail (bil feet rest in PF, minimal DF) RLE Deficits / Details: ecchymosis right anterior knee       Communication   Communication: No difficulties  Cognition Arousal/Alertness: Awake/alert Behavior During Therapy: WFL for tasks assessed/performed Overall Cognitive Status: History  of cognitive impairments - at baseline                                 General Comments: hx dementia      General Comments      Exercises     Assessment/Plan    PT Assessment Patient needs continued PT services  PT Problem List Decreased strength;Decreased mobility;Decreased activity tolerance;Decreased balance;Decreased knowledge of use of DME       PT Treatment Interventions DME instruction;Balance training;Therapeutic exercise;Functional mobility training;Therapeutic activities;Patient/family  education;Wheelchair mobility training    PT Goals (Current goals can be found in the Care Plan section)  Acute Rehab PT Goals PT Goal Formulation: With patient Time For Goal Achievement: 01/16/20 Potential to Achieve Goals: Good    Frequency Min 2X/week   Barriers to discharge        Co-evaluation               AM-PAC PT "6 Clicks" Mobility  Outcome Measure Help needed turning from your back to your side while in a flat bed without using bedrails?: A Lot Help needed moving from lying on your back to sitting on the side of a flat bed without using bedrails?: Total Help needed moving to and from a bed to a chair (including a wheelchair)?: Total Help needed standing up from a chair using your arms (e.g., wheelchair or bedside chair)?: Total Help needed to walk in hospital room?: Total Help needed climbing 3-5 steps with a railing? : Total 6 Click Score: 7    End of Session Equipment Utilized During Treatment: Gait belt Activity Tolerance: Patient tolerated treatment well Patient left: in bed;with call bell/phone within reach Nurse Communication: Mobility status (needs assist for feeding right now) PT Visit Diagnosis: Muscle weakness (generalized) (M62.81);Other abnormalities of gait and mobility (R26.89)    Time: 8099-8338 PT Time Calculation (min) (ACUTE ONLY): 17 min   Charges:   PT Evaluation $PT Eval Low Complexity: 1 Low     Kati PT, DPT Acute Rehabilitation Services Pager: [phone] Office: [phone]  Maida Sale E 01/02/2020, 4:19 PM

## 2020-01-02 NOTE — ED Notes (Signed)
Delay in transport to floor due to primary RN transporting another progressive pt to the floor.

## 2020-01-02 NOTE — Progress Notes (Signed)
PROGRESS NOTE    Christy Pham  UEK:[id number] DOB: [birth year] DOA: 01/01/2020 PCP: Barbie Banner, MD   Brief Narrative:  74 year old with history of A. fib on Coumadin, DM2, HLD, Parkinson's, essential hypertension presents for worsening confusion at home, unsteady.  In the ER noted to have sepsis secondary to urinary tract infection with A. fib with RVR.   Assessment & Plan:   Active Problems:   ATRIAL FIBRILLATION WITH RAPID VENTRICULAR RESPONSE   Chronic systolic heart failure (HCC)   Parkinsonism (HCC)   Lower urinary tract infectious disease   Dehydration   Essential hypertension   Sepsis (HCC)   Hypokalemia   Acute metabolic encephalopathy   Hallucinations  Sepsis secondary to urinary tract infection, POA Acute metabolic encephalopathy Mild to moderate dehydration -Sepsis physiology is slowly improving.  UA is positive -Follow-up culture data -Empiric IV Rocephin -Lactic acid has trended down. -CT head-chronic atrophy, no acute changes -Chest x-ray unremarkable.  Procalcitonin negative -Normal saline 100 cc/h  Atrial fibrillation with RVR -Secondary to underlying infection. -Resume home metoprolol and digoxin -Coumadin per pharmacy  Chronic congestive heart failure with preserved ejection fraction, 60 to 65% -Intravascular volume depletion therefore getting hydration. -Supportive care.  Essential hypertension -Continue metoprolol and digoxin  History of Parkinson's disease -Continue home Stalevo  Diabetes mellitus type 2 -Insulin sliding scale and Accu-Cheks -A1c 6.7.  TSH within normal limits. -Home Metformin on hold  DVT prophylaxis: Coumadin Code Status: DNR Family Communication:  Called husband and son, no answer.   Status is: Inpatient  Remains inpatient appropriate because:IV treatments appropriate due to intensity of illness or inability to take PO   Dispo: The patient is from: Home              Anticipated d/c is to: To be determined               Anticipated d/c date is: 2 days              Patient currently is not medically stable to d/c.  Maintain hospital stay for IV fluids and IV antibiotics until we further determine urine and blood culture data.       Body mass index is 34.87 kg/m.  Pressure Ulcer 08/14/11 (Active)  08/14/11 0030  Location: Leg  Location Orientation:   Staging:   Wound Description (Comments):   Present on Admission:      Pressure Ulcer 08/15/11 Stage II -  Partial thickness loss of dermis presenting as a shallow open ulcer with a red, pink wound bed without slough. red, scaly (Active)  08/15/11 0834  Location: Heel  Location Orientation: Right;Left  Staging: Stage II -  Partial thickness loss of dermis presenting as a shallow open ulcer with a red, pink wound bed without slough.  Wound Description (Comments): red, scaly  Present on Admission:       Subjective: Patient seen and examined in the ER at bedside.  She appeared much more comfortable and denied any complaints but when attempted to interact with me she became very tachycardic with heart rate as high as 130s.  Soon after her heart rate slowed down to normal sinus rhythm in 60s.  Blood pressure remained stable.  Denies any other complaints.  She was slightly tremulous which she tells me is secondary to her Parkinson's.  Review of Systems Otherwise negative except as per HPI, including: General: Denies fever, chills, night sweats or unintended weight loss. Resp: Denies cough, wheezing, shortness of breath. Cardiac: Denies chest  pain, palpitations, orthopnea, paroxysmal nocturnal dyspnea. GI: Denies abdominal pain, nausea, vomiting, diarrhea or constipation GU: Denies dysuria, frequency, hesitancy or incontinence MS: Denies muscle aches, joint pain or swelling Neuro: Denies headache, neurologic deficits (focal weakness, numbness, tingling), abnormal gait Psych: Denies anxiety, depression, SI/HI/AVH Skin: Denies new rashes or  lesions ID: Denies sick contacts, exotic exposures, travel  Examination:  General exam: Appears calm and comfortable, appears chronically ill Respiratory system: Minimal bibasilar crackles Cardiovascular system: Irregularly irregular, intermittent tachycardia. Gastrointestinal system: Abdomen is nondistended, soft and nontender. No organomegaly or masses felt. Normal bowel sounds heard. Central nervous system: No clear focal neuro deficits elicited.. Extremities: Symmetric 5 x 5 power. Skin: No rashes, lesions or ulcers Psychiatry: Alert awake oriented X3. Objective: Vitals:   01/02/20 0400 01/02/20 0600 01/02/20 0630 01/02/20 0700  BP: 137/85 (!) 141/71 (!) 157/84 (!) 150/118  Pulse: (!) 54 62 68 70  Resp: (!) 23 17 17  (!) 22  Temp:      TempSrc:      SpO2: 97% 98% 97% 97%  Weight:      Height:        Intake/Output Summary (Last 24 hours) at 01/02/2020 0758 Last data filed at 01/02/2020 0548 Gross per 24 hour  Intake 2400 ml  Output --  Net 2400 ml   Filed Weights   01/01/20 1718  Weight: 90.7 kg     Data Reviewed:   CBC: Recent Labs  Lab 01/01/20 2124 01/02/20 0549  WBC 11.2* 9.6  NEUTROABS 7.3 4.7  HGB 12.0 11.4*  HCT 35.8* 34.1*  MCV 93.0 94.2  PLT 268 234   Basic Metabolic Panel: Recent Labs  Lab 01/01/20 1830 01/02/20 0018 01/02/20 0549  NA 140  --  143  K 3.1*  --  3.8  CL 103  --  107  CO2 25  --  27  GLUCOSE 148*  --  114*  BUN 13  --  10  CREATININE 0.80  --  0.67  CALCIUM 9.1  --  8.6*  MG  --  1.8 1.9  PHOS  --   --  3.0   GFR: Estimated Creatinine Clearance: 65.6 mL/min (by C-G formula based on SCr of 0.67 mg/dL). Liver Function Tests: Recent Labs  Lab 01/01/20 1830 01/02/20 0549  AST 28 20  ALT 22 20  ALKPHOS 55 45  BILITOT 1.4* 0.9  PROT 6.9 5.8*  ALBUMIN 3.8 3.1*   No results for input(s): LIPASE, AMYLASE in the last 168 hours. No results for input(s): AMMONIA in the last 168 hours. Coagulation Profile: Recent Labs   Lab 01/01/20 1830 01/02/20 0549  INR 3.4* 4.1*   Cardiac Enzymes: No results for input(s): CKTOTAL, CKMB, CKMBINDEX, TROPONINI in the last 168 hours. BNP (last 3 results) No results for input(s): PROBNP in the last 8760 hours. HbA1C: Recent Labs    01/01/20 2124  HGBA1C 6.1*   CBG: Recent Labs  Lab 01/01/20 1712 01/02/20 0108 01/02/20 0427  GLUCAP 134* 116* 116*   Lipid Profile: No results for input(s): CHOL, HDL, LDLCALC, TRIG, CHOLHDL, LDLDIRECT in the last 72 hours. Thyroid Function Tests: Recent Labs    01/02/20 0549  TSH 2.402   Anemia Panel: No results for input(s): VITAMINB12, FOLATE, FERRITIN, TIBC, IRON, RETICCTPCT in the last 72 hours. Sepsis Labs: Recent Labs  Lab 01/01/20 1830 01/01/20 2004 01/02/20 0018 01/02/20 0234 01/02/20 0549  PROCALCITON  --   --  <0.10  --   --   LATICACIDVEN 2.9* 3.4*  --  2.0* 1.2    Recent Results (from the past 240 hour(s))  SARS Coronavirus 2 by RT PCR (hospital order, performed in [facility] hospital lab) Nasopharyngeal Nasopharyngeal Swab     Status: None   Collection Time: 01/01/20  5:33 PM   Specimen: Nasopharyngeal Swab  Result Value Ref Range Status   SARS Coronavirus 2 NEGATIVE NEGATIVE Final    Comment: (NOTE) SARS-CoV-2 target nucleic acids are NOT DETECTED.  The SARS-CoV-2 RNA is generally detectable in upper and lower respiratory specimens during the acute phase of infection. The lowest concentration of SARS-CoV-2 viral copies this assay can detect is 250 copies / mL. A negative result does not preclude SARS-CoV-2 infection and should not be used as the sole basis for treatment or other patient management decisions.  A negative result may occur with improper specimen collection / handling, submission of specimen other than nasopharyngeal swab, presence of viral mutation(s) within the areas targeted by this assay, and inadequate number of viral copies (<250 copies / mL). A negative result must be  combined with clinical observations, patient history, and epidemiological information.  Fact Sheet for Patients:   BoilerBrush.com.cy  Fact Sheet for Healthcare Providers: https://pope.com/  This test is not yet approved or  cleared by the [place] FDA and has been authorized for detection and/or diagnosis of SARS-CoV-2 by FDA under an Emergency Use Authorization (EUA).  This EUA will remain in effect (meaning this test can be used) for the duration of the COVID-19 declaration under Section 564(b)(1) of the Act, 21 U.S.C. section 360bbb-3(b)(1), unless the authorization is terminated or revoked sooner.  Performed at [facility], [address], [place], [place] [address]          Radiology Studies: CT Head Wo Contrast  Result Date: 01/01/2020 CLINICAL DATA:  Altered mental status. EXAM: CT HEAD WITHOUT CONTRAST TECHNIQUE: Contiguous axial images were obtained from the base of the skull through the vertex without intravenous contrast. COMPARISON:  None. FINDINGS: Brain: There is mild cerebral atrophy with widening of the extra-axial spaces and ventricular dilatation. There are areas of decreased attenuation within the white matter tracts of the supratentorial brain, consistent with microvascular disease changes Vascular: No hyperdense vessel or unexpected calcification. Skull: Normal. Negative for fracture or focal lesion. Sinuses/Orbits: No acute finding. Other: None. IMPRESSION: 1. Generalized cerebral atrophy. 2. No acute intracranial abnormality. Electronically Signed   By: Aram Candela M.D.   On: 01/01/2020 18:16   DG Chest Portable 1 View  Result Date: 01/01/2020 CLINICAL DATA:  Weakness EXAM: PORTABLE CHEST 1 VIEW COMPARISON:  05/17/2014 FINDINGS: Mild cardiac enlargement unchanged. Normal vascularity. Negative for heart failure. Atherosclerotic calcification aortic arch. Lungs are clear without  infiltrate or effusion. IMPRESSION: No active disease. Electronically Signed   By: Marlan Palau M.D.   On: 01/01/2020 18:02        Scheduled Meds: . docusate sodium  100 mg Oral BID  . insulin aspart  0-9 Units Subcutaneous Q4H  . sodium chloride flush  3 mL Intravenous Q12H  . Warfarin - Pharmacist Dosing Inpatient   Does not apply q1600   Continuous Infusions: . sodium chloride 100 mL/hr at 01/02/20 0119  . cefTRIAXone (ROCEPHIN)  IV    . lactated ringers Stopped (01/02/20 0119)     LOS: 1 day   Time spent= 35 mins    Decorey Wahlert Joline Maxcy, MD [facility]  If 7PM-7AM, please contact night-coverage  01/02/2020, 7:58 AM

## 2020-01-02 NOTE — Progress Notes (Signed)
ANTICOAGULATION CONSULT NOTE - Initial Consult  Pharmacy Consult for Warfarin Indication: atrial fibrillation  No Known Allergies  Patient Measurements: Height: 5' 3.5" (161.3 cm) Weight: 90.7 kg (200 lb) IBW/kg (Calculated) : 53.55  Vital Signs: Temp: 101 F (38.3 C) (08/06 1803) Temp Source: Rectal (08/06 1803) BP: 127/75 (08/06 2230) Pulse Rate: 92 (08/06 2230)  Labs: Recent Labs    01/01/20 1830 01/01/20 2124  HGB  --  12.0  HCT  --  35.8*  PLT  --  268  LABPROT 33.4*  --   INR 3.4*  --   CREATININE 0.80  --     Estimated Creatinine Clearance: 65.6 mL/min (by C-G formula based on SCr of 0.8 mg/dL).   Medical History: Past Medical History:  Diagnosis Date  . Chronic atrial fibrillation (HCC)   . Depression   . Diabetes (HCC)   . Hyperlipidemia    dx circa 2008  . Hypertension    dx circa 2008  . Mitral regurgitation    mild to moderate by echo 2013  . Osteoarthritis    "L knee primarily" (05/17/2014)  . Parkinson's disease    dx circa 1990 by Dr. Thad Ranger    Medications:  PTA Warfarin 7.5mg  daily - last dose taken 8/6 @ 0830  Assessment:  74 yr female admitted with sepsis due to UTI and AFib with RVR  PMH significant for AFib, DM, HLD, HTN  Pharmacy consulted to dose warfarin  INR on 8/6 = 3.4   Goal of Therapy:  INR 2-3   Plan:  No further warfarin this evening Follow daily PT/INR and dose warfarin accordingly  Maryellen Pile, PharmD 01/02/2020,1:09 AM

## 2020-01-02 NOTE — ED Notes (Signed)
Spoke with pharmacy regarding missing medications that are due to be given at 1600. Pharmacy states they will send the medications.

## 2020-01-02 NOTE — ED Notes (Signed)
Patient was given her breakfast tray.

## 2020-01-02 NOTE — ED Notes (Signed)
2nd attempt to call report to 4E, but no answer.

## 2020-01-02 NOTE — ED Notes (Signed)
Patient given meal tray.

## 2020-01-02 NOTE — Progress Notes (Signed)
Clinical/Bedside Swallow Evaluation Patient Details  Name: Christy Pham MRN: [id number] Date of Birth: [birth year]  Today's Date: 01/02/2020 Time: SLP Start Time (ACUTE ONLY): 1615 SLP Stop Time (ACUTE ONLY): 1650 SLP Time Calculation (min) (ACUTE ONLY): 35 min  Past Medical History:  Past Medical History:  Diagnosis Date  . Chronic atrial fibrillation (HCC)   . Depression   . Diabetes (HCC)   . Hyperlipidemia    dx circa 2008  . Hypertension    dx circa 2008  . Mitral regurgitation    mild to moderate by echo 2013  . Osteoarthritis    "L knee primarily" (05/17/2014)  . Parkinson's disease    dx circa 1990 by Dr. Thad Ranger   Past Surgical History:  Past Surgical History:  Procedure Laterality Date  . COLONOSCOPY     2006 by Jeani Hawking (clear per pt)  . TONSILLECTOMY  ~ 1950  . TUBAL LIGATION  ~ 19830   HPI:  74 year old with history of A. fib on Coumadin, DM2, HLD, Parkinsonism, essential hypertension presents for worsening confusion at home, unsteady.  In the ER noted to have sepsis secondary to urinary tract infection with A. fib with RVR.  Swallow eval ordered.  Pt  CXR negative and Head CT negative for acute change.     Assessment / Plan / Recommendation Clinical Impression  Patient presents with Parkinson's related oral dysphagia with clinical indication of delayed oral transiting and drooling on left due to decreased labial seal/decreased incidence of swallowing saliva.   There were no indications of airway compromise with po of vanilla wafers, pudding, water and milk.  Pt attempts to self-feed but has significant tremors.  Asked RN if patient could keep handled-cup from breakfast meal to use to consume liquids via straw independently.  Masked facies and hypomimia with monotone phonation.  noted but her speech is intelligible.   Encouraged patient to keep her cough, expectoration AKA "hock" and voice strong for airway protection.  Advised her to modification to medication  administration if needed including taking pills with purees *start and follow with liquids.  Recommend continue diet as tolerated, encouraged pt to order foods she can more easily feed herself such as sandwiches, etc.  No SLP follow up indicated. Thank you for the consult for this most pleasant pt.    SLP Visit Diagnosis: Dysphagia, oral phase (R13.11)    Aspiration Risk  Mild aspiration risk    Diet Recommendation Regular;Nectar-thick liquid   Liquid Administration via: Straw Medication Administration: Whole meds with liquid (with pudding if problematic) Supervision: Staff to assist with self feeding;Comment (assist d/t tremors) Compensations: Slow rate;Small sips/bites Postural Changes: Seated upright at 90 degrees;Remain upright for at least 30 minutes after po intake    Other  Recommendations   n/a  Follow up Recommendations    n/a    Frequency and Duration      n/a      Prognosis    n/a    Swallow Study   General Date of Onset: 01/02/20 HPI: 74 year old with history of A. fib on Coumadin, DM2, HLD, Parkinsonism, essential hypertension presents for worsening confusion at home, unsteady.  In the ER noted to have sepsis secondary to urinary tract infection with A. fib with RVR.  Swallow eval ordered.  Pt  CXR negative and Head CT negative for acute change.   Type of Study: Bedside Swallow Evaluation Previous Swallow Assessment: none in system Diet Prior to this Study: Regular;Thin liquids Temperature Spikes Noted: No Respiratory Status:  Room air History of Recent Intubation: No Behavior/Cognition: Alert;Cooperative Oral Cavity Assessment: Within Functional Limits Oral Care Completed by SLP: No Oral Cavity - Dentition: Adequate natural dentition Vision:  (some vision deficits) Self-Feeding Abilities: Needs assist (tremors) Patient Positioning: Upright in bed Baseline Vocal Quality: Normal Volitional Cough: Strong Volitional Swallow: Able to elicit (with delay)     Oral/Motor/Sensory Function Overall Oral Motor/Sensory Function: Mild impairment (mild extrapyramidal lingual movement, mild drooling left) Facial Strength: Reduced left   Ice Chips Ice chips: Not tested   Thin Liquid Thin Liquid: Within functional limits Presentation: Self Fed;Straw Other Comments:  (pt needed frequent respiratory breaks with 3 ozs of water)    Nectar Thick Nectar Thick Liquid: Not tested   Honey Thick Honey Thick Liquid: Not tested   Puree Puree: Within functional limits Presentation: Spoon   Solid     Solid: Within functional limits Presentation: Self Fed      Chales Abrahams 01/02/2020,5:22 PM

## 2020-01-02 NOTE — ED Notes (Signed)
ED TO INPATIENT HANDOFF REPORT  Name/Age/Gender Christy Pham 74 y.o. female  Code Status    Code Status Orders  (From admission, onward)         Start     Ordered   01/02/20 0049  Do not attempt resuscitation (DNR)  Continuous       Question Answer Comment  In the event of cardiac or respiratory ARREST Do not call a "code blue"   In the event of cardiac or respiratory ARREST Do not perform Intubation, CPR, defibrillation or ACLS   In the event of cardiac or respiratory ARREST Use medication by any route, position, wound care, and other measures to relive pain and suffering. May use oxygen, suction and manual treatment of airway obstruction as needed for comfort.      01/02/20 0048        Code Status History    Date Active Date Inactive Code Status Order ID Comments User Context   01/02/2020 0015 01/02/2020 0048 Full Code [id number]  Therisa Doyne, MD ED   05/17/2014 1630 05/19/2014 1930 DNR [id number]  Conley Canal Inpatient   08/15/2011 1305 08/16/2011 1747 Full Code [id number]  Standley Brooking, MD Inpatient   Advance Care Planning Activity      Home/SNF/Other Home  Chief Complaint Sepsis [facility]) [A41.9]  Level of Care/Admitting Diagnosis ED Disposition    ED Disposition Condition Comment   Admit  Hospital Area: [facility] [[id number]]  Level of Care: Progressive [102]  Admit to Progressive based on following criteria: MULTISYSTEM THREATS such as stable sepsis, metabolic/electrolyte imbalance with or without encephalopathy that is responding to early treatment.  May [facility] patient to [facility] if equivalent level of care is available:: No  Covid Evaluation: Confirmed COVID Negative  Diagnosis: Sepsis [facility]) [[id number]]  Admitting Physician: Therisa Doyne [[id number]]  Attending Physician: Therisa Doyne [[id number]]  Estimated length of stay: past midnight tomorrow  Certification:: I certify this patient will need inpatient  services for at least 2 midnights       Medical History Past Medical History:  Diagnosis Date  . Chronic atrial fibrillation (HCC)   . Depression   . Diabetes (HCC)   . Hyperlipidemia    dx circa 2008  . Hypertension    dx circa 2008  . Mitral regurgitation    mild to moderate by echo 2013  . Osteoarthritis    "L knee primarily" (05/17/2014)  . Parkinson's disease    dx circa 1990 by Dr. Thad Ranger    Allergies No Known Allergies  IV Location/Drains/Wounds Patient Lines/Drains/Airways Status    Active Line/Drains/Airways    Name Placement date Placement time Site Days   Peripheral IV 01/01/20 Right Antecubital 01/01/20  --  Antecubital  1   Peripheral IV 01/01/20 Left;Posterior Hand 01/01/20  1915  Hand  1   External Urinary Catheter 01/01/20  2047  --  1   Pressure Ulcer 08/14/11 08/14/11  0030   3063   Pressure Ulcer 08/15/11 Stage II -  Partial thickness loss of dermis presenting as a shallow open ulcer with a red, pink wound bed without slough. red, scaly 08/15/11  0834   3062   Wound 08/14/11 Abrasion(s);Excoriated (Scratch marks) Buttocks 08/14/11  0030  Buttocks  3063   Wound 08/14/11 Excoriated (Scratch marks) Labia Medial red apots with black scab over areas 08/14/11  0040  Labia  3063          Labs/Imaging Results for orders placed  or performed during the hospital encounter of 01/01/20 (from the past 48 hour(s))  CBG monitoring, ED     Status: Abnormal   Collection Time: 01/01/20  5:12 PM  Result Value Ref Range   Glucose-Capillary 134 (H) 70 - 99 mg/dL    Comment: Glucose reference range applies only to samples taken after fasting for at least 8 hours.  SARS Coronavirus 2 by RT PCR (hospital order, performed in [facility] hospital lab) Nasopharyngeal Nasopharyngeal Swab     Status: None   Collection Time: 01/01/20  5:33 PM   Specimen: Nasopharyngeal Swab  Result Value Ref Range   SARS Coronavirus 2 NEGATIVE NEGATIVE    Comment: (NOTE) SARS-CoV-2 target  nucleic acids are NOT DETECTED.  The SARS-CoV-2 RNA is generally detectable in upper and lower respiratory specimens during the acute phase of infection. The lowest concentration of SARS-CoV-2 viral copies this assay can detect is 250 copies / mL. A negative result does not preclude SARS-CoV-2 infection and should not be used as the sole basis for treatment or other patient management decisions.  A negative result may occur with improper specimen collection / handling, submission of specimen other than nasopharyngeal swab, presence of viral mutation(s) within the areas targeted by this assay, and inadequate number of viral copies (<250 copies / mL). A negative result must be combined with clinical observations, patient history, and epidemiological information.  Fact Sheet for Patients:   BoilerBrush.com.cy  Fact Sheet for Healthcare Providers: https://pope.com/  This test is not yet approved or  cleared by the [place] FDA and has been authorized for detection and/or diagnosis of SARS-CoV-2 by FDA under an Emergency Use Authorization (EUA).  This EUA will remain in effect (meaning this test can be used) for the duration of the COVID-19 declaration under Section 564(b)(1) of the Act, 21 U.S.C. section 360bbb-3(b)(1), unless the authorization is terminated or revoked sooner.  Performed at [facility], [address] [place], [place] [address]   Protime-INR     Status: Abnormal   Collection Time: 01/01/20  6:30 PM  Result Value Ref Range   Prothrombin Time 33.4 (H) 11.4 - 15.2 seconds   INR 3.4 (H) 0.8 - 1.2    Comment: (NOTE) INR goal varies based on device and disease states. Performed at [facility], [address] [place], [place] [address]   Digoxin level     Status: Abnormal   Collection Time: 01/01/20  6:30 PM  Result Value Ref Range   Digoxin Level 0.4 (L) 0.8 - 2.0 ng/mL     Comment: Performed at [facility], [address] [place], [place] [address]  Comprehensive metabolic panel     Status: Abnormal   Collection Time: 01/01/20  6:30 PM  Result Value Ref Range   Sodium 140 135 - 145 mmol/L   Potassium 3.1 (L) 3.5 - 5.1 mmol/L   Chloride 103 98 - 111 mmol/L   CO2 25 22 - 32 mmol/L   Glucose, Bld 148 (H) 70 - 99 mg/dL    Comment: Glucose reference range applies only to samples taken after fasting for at least 8 hours.   BUN 13 8 - 23 mg/dL   Creatinine, Ser 3.87 0.44 - 1.00 mg/dL   Calcium 9.1 8.9 - 56.4 mg/dL   Total Protein 6.9 6.5 - 8.1 g/dL   Albumin 3.8 3.5 - 5.0 g/dL   AST 28 15 - 41 U/L   ALT 22 0 - 44 U/L  Alkaline Phosphatase 55 38 - 126 U/L   Total Bilirubin 1.4 (H) 0.3 - 1.2 mg/dL   GFR calc non Af Amer >60 >60 mL/min   GFR calc Af Amer >60 >60 mL/min   Anion gap 12 5 - 15    Comment: Performed at [facility], [address] [place], [place] [address]  Lactic acid, plasma     Status: Abnormal   Collection Time: 01/01/20  6:30 PM  Result Value Ref Range   Lactic Acid, Venous 2.9 (HH) 0.5 - 1.9 mmol/L    Comment: CRITICAL RESULT CALLED TO, READ BACK BY AND VERIFIED WITH: Nelda Marseille RN 1933 01/01/20 JM Performed at [facility], [address] [place], [place] [address]   Culture, blood (routine x 2)     Status: None (Preliminary result)   Collection Time: 01/01/20  6:30 PM   Specimen: BLOOD  Result Value Ref Range   Specimen Description      BLOOD LEFT HAND Performed at [facility], [address] [place], [place] [address]    Special Requests      BOTTLES DRAWN AEROBIC AND ANAEROBIC Blood Culture adequate volume Performed at [facility], [address] [place], [place] [address]    Culture      NO GROWTH < 12 HOURS Performed at [facility], [address] [place], [place] [address]    Report Status PENDING   Culture,  blood (routine x 2)     Status: None (Preliminary result)   Collection Time: 01/01/20  6:30 PM   Specimen: BLOOD  Result Value Ref Range   Specimen Description      BLOOD RIGHT HAND Performed at [facility], [address], [place], [place] [address]    Special Requests      BOTTLES DRAWN AEROBIC AND ANAEROBIC Blood Culture adequate volume Performed at [facility], [address], [place], [place] [address]    Culture      NO GROWTH < 12 HOURS Performed at [facility], [address] [place], [place] [address]    Report Status PENDING   Lactic acid, plasma     Status: Abnormal   Collection Time: 01/01/20  8:04 PM  Result Value Ref Range   Lactic Acid, Venous 3.4 (HH) 0.5 - 1.9 mmol/L    Comment: CRITICAL RESULT CALLED TO, READ BACK BY AND VERIFIED WITH:  RN DAWSTER @ 2135 01/01/20 LL Performed at [facility], [address] [place], [place] [address]   Urinalysis, Routine w reflex microscopic     Status: Abnormal   Collection Time: 01/01/20  9:24 PM  Result Value Ref Range   Color, Urine AMBER (A) YELLOW    Comment: BIOCHEMICALS MAY BE AFFECTED BY COLOR   APPearance HAZY (A) CLEAR   Specific Gravity, Urine 1.019 1.005 - 1.030   pH 5.0 5.0 - 8.0   Glucose, UA NEGATIVE NEGATIVE mg/dL   Hgb urine dipstick MODERATE (A) NEGATIVE   Bilirubin Urine NEGATIVE NEGATIVE   Ketones, ur 5 (A) NEGATIVE mg/dL   Protein, ur 30 (A) NEGATIVE mg/dL   Nitrite POSITIVE (A) NEGATIVE   Leukocytes,Ua TRACE (A) NEGATIVE   RBC / HPF 0-5 0 - 5 RBC/hpf   WBC, UA 11-20 0 - 5 WBC/hpf   Bacteria, UA MANY (A) NONE SEEN   Squamous Epithelial / LPF 0-5 0 - 5   Hyaline Casts, UA PRESENT  Comment: Performed at [facility], [address], [place], [place] [address]  CBC with Differential/Platelet     Status: Abnormal   Collection Time: 01/01/20  9:24 PM  Result Value Ref Range   WBC 11.2 (H) 4.0 - 10.5 K/uL   RBC  3.85 (L) 3.87 - 5.11 MIL/uL   Hemoglobin 12.0 12.0 - 15.0 g/dL   HCT 60.4 (L) 36 - 46 %   MCV 93.0 80.0 - 100.0 fL   MCH 31.2 26.0 - 34.0 pg   MCHC 33.5 30.0 - 36.0 g/dL   RDW 54.0 98.1 - 19.1 %   Platelets 268 150 - 400 K/uL   nRBC 0.0 0.0 - 0.2 %   Neutrophils Relative % 64 %   Neutro Abs 7.3 1.7 - 7.7 K/uL   Lymphocytes Relative 27 %   Lymphs Abs 3.0 0.7 - 4.0 K/uL   Monocytes Relative 7 %   Monocytes Absolute 0.7 0 - 1 K/uL   Eosinophils Relative 1 %   Eosinophils Absolute 0.1 0 - 0 K/uL   Basophils Relative 1 %   Basophils Absolute 0.1 0 - 0 K/uL   Immature Granulocytes 0 %   Abs Immature Granulocytes 0.03 0.00 - 0.07 K/uL    Comment: Performed at [facility], [address], [place], [place] [address]  Hemoglobin A1c     Status: Abnormal   Collection Time: 01/01/20  9:24 PM  Result Value Ref Range   Hgb A1c MFr Bld 6.1 (H) 4.8 - 5.6 %    Comment: (NOTE) Pre diabetes:          5.7%-6.4%  Diabetes:              >6.4%  Glycemic control for   <7.0% adults with diabetes    Mean Plasma Glucose 128.37 mg/dL    Comment: Performed at [facility], [address], [place], [place] [address]  Troponin I (High Sensitivity)     Status: None   Collection Time: 01/02/20 12:18 AM  Result Value Ref Range   Troponin I (High Sensitivity) 14 <18 ng/L    Comment: (NOTE) Elevated high sensitivity troponin I (hsTnI) values and significant  changes across serial measurements may suggest ACS but many other  chronic and acute conditions are known to elevate hsTnI results.  Refer to the "Links" section for chest pain algorithms and additional  guidance. Performed at [facility], [address], [place], [place] [address]   Procalcitonin     Status: None   Collection Time: 01/02/20 12:18 AM  Result Value Ref Range   Procalcitonin <0.10 ng/mL    Comment:        Interpretation: PCT (Procalcitonin) <= 0.5 ng/mL: Systemic infection (sepsis)  is not likely. Local bacterial infection is possible. (NOTE)       Sepsis PCT Algorithm           Lower Respiratory Tract                                      Infection PCT Algorithm    ----------------------------     ----------------------------         PCT < 0.25 ng/mL                PCT < 0.10 ng/mL          Strongly encourage  Strongly discourage   discontinuation of antibiotics    initiation of antibiotics    ----------------------------     -----------------------------       PCT 0.25 - 0.50 ng/mL            PCT 0.10 - 0.25 ng/mL               OR       >80% decrease in PCT            Discourage initiation of                                            antibiotics      Encourage discontinuation           of antibiotics    ----------------------------     -----------------------------         PCT >= 0.50 ng/mL              PCT 0.26 - 0.50 ng/mL               AND        <80% decrease in PCT             Encourage initiation of                                             antibiotics       Encourage continuation           of antibiotics    ----------------------------     -----------------------------        PCT >= 0.50 ng/mL                  PCT > 0.50 ng/mL               AND         increase in PCT                  Strongly encourage                                      initiation of antibiotics    Strongly encourage escalation           of antibiotics                                     -----------------------------                                           PCT <= 0.25 ng/mL                                                 OR                                        >  80% decrease in PCT                                      Discontinue / Do not initiate                                             antibiotics  Performed at [facility], [address], [place], [place] [address]   Brain natriuretic peptide     Status: None   Collection Time:  01/02/20 12:18 AM  Result Value Ref Range   B Natriuretic Peptide 70.4 0.0 - 100.0 pg/mL    Comment: Performed at [facility], [address], [place], [place] [address]  Magnesium     Status: None   Collection Time: 01/02/20 12:18 AM  Result Value Ref Range   Magnesium 1.8 1.7 - 2.4 mg/dL    Comment: Performed at [facility], [address] [place], [place] [address]  CBG monitoring, ED     Status: Abnormal   Collection Time: 01/02/20  1:08 AM  Result Value Ref Range   Glucose-Capillary 116 (H) 70 - 99 mg/dL    Comment: Glucose reference range applies only to samples taken after fasting for at least 8 hours.  Lactic acid, plasma     Status: Abnormal   Collection Time: 01/02/20  2:34 AM  Result Value Ref Range   Lactic Acid, Venous 2.0 (HH) 0.5 - 1.9 mmol/L    Comment: CRITICAL RESULT CALLED TO, READ BACK BY AND VERIFIED WITH: RN Kathlene November @324  01/02/20 LL Performed at [facility], [address], [place], [place]   Troponin I (High Sensitivity)     Status: None   Collection Time: 01/02/20  2:34 AM  Result Value Ref Range   Troponin I (High Sensitivity) 14 <18 ng/L    Comment: (NOTE) Elevated high sensitivity troponin I (hsTnI) values and significant  changes across serial measurements may suggest ACS but many other  chronic and acute conditions are known to elevate hsTnI results.  Refer to the "Links" section for chest pain algorithms and additional  guidance. Performed at [facility], [address], [place], [place]   CBG monitoring, ED     Status: Abnormal   Collection Time: 01/02/20  4:27 AM  Result Value Ref Range   Glucose-Capillary 116 (H) 70 - 99 mg/dL    Comment: Glucose reference range applies only to samples taken after fasting for at least 8 hours.  Lactic acid, plasma     Status: None   Collection Time: 01/02/20  5:49 AM  Result Value Ref Range   Lactic  Acid, Venous 1.2 0.5 - 1.9 mmol/L    Comment: Performed at [facility], [address], [place], [place]  Magnesium     Status: None   Collection Time: 01/02/20  5:49 AM  Result Value Ref Range   Magnesium 1.9 1.7 - 2.4 mg/dL    Comment: Performed at [facility], [address], [place], [place]  Phosphorus     Status: None   Collection Time: 01/02/20  5:49 AM  Result Value Ref Range   Phosphorus 3.0 2.5 - 4.6 mg/dL    Comment: Performed at  [facility], [address] [place], [place] [address]  CBC WITH DIFFERENTIAL     Status: Abnormal   Collection Time: 01/02/20  5:49 AM  Result Value Ref Range   WBC 9.6 4.0 - 10.5 K/uL   RBC 3.62 (L) 3.87 - 5.11 MIL/uL   Hemoglobin 11.4 (L) 12.0 - 15.0 g/dL   HCT 60.434.1 (L) 36 - 46 %   MCV 94.2 80.0 - 100.0 fL   MCH 31.5 26.0 - 34.0 pg   MCHC 33.4 30.0 - 36.0 g/dL   RDW 54.014.6 98.111.5 - 19.115.5 %   Platelets 234 150 - 400 K/uL   nRBC 0.0 0.0 - 0.2 %   Neutrophils Relative % 49 %   Neutro Abs 4.7 1.7 - 7.7 K/uL   Lymphocytes Relative 40 %   Lymphs Abs 3.9 0.7 - 4.0 K/uL   Monocytes Relative 7 %   Monocytes Absolute 0.7 0 - 1 K/uL   Eosinophils Relative 3 %   Eosinophils Absolute 0.3 0 - 0 K/uL   Basophils Relative 1 %   Basophils Absolute 0.1 0 - 0 K/uL   Immature Granulocytes 0 %   Abs Immature Granulocytes 0.03 0.00 - 0.07 K/uL    Comment: Performed at [facility], [address] [place], [place] [address]  TSH     Status: None   Collection Time: 01/02/20  5:49 AM  Result Value Ref Range   TSH 2.402 0.350 - 4.500 uIU/mL    Comment: Performed by a 3rd Generation assay with a functional sensitivity of <=0.01 uIU/mL. Performed at [facility], [address] [place], [place] [address]   Comprehensive metabolic panel     Status: Abnormal   Collection Time: 01/02/20  5:49 AM  Result Value Ref Range   Sodium 143 135 - 145  mmol/L   Potassium 3.8 3.5 - 5.1 mmol/L   Chloride 107 98 - 111 mmol/L   CO2 27 22 - 32 mmol/L   Glucose, Bld 114 (H) 70 - 99 mg/dL    Comment: Glucose reference range applies only to samples taken after fasting for at least 8 hours.   BUN 10 8 - 23 mg/dL   Creatinine, Ser 0.860.67 0.44 - 1.00 mg/dL   Calcium 8.6 (L) 8.9 - 10.3 mg/dL   Total Protein 5.8 (L) 6.5 - 8.1 g/dL   Albumin 3.1 (L) 3.5 - 5.0 g/dL   AST 20 15 - 41 U/L   ALT 20 0 - 44 U/L   Alkaline Phosphatase 45 38 - 126 U/L   Total Bilirubin 0.9 0.3 - 1.2 mg/dL   GFR calc non Af Amer >60 >60 mL/min   GFR calc Af Amer >60 >60 mL/min   Anion gap 9 5 - 15    Comment: Performed at [facility], [address] [place], [place] [address]  Protime-INR     Status: Abnormal   Collection Time: 01/02/20  5:49 AM  Result Value Ref Range   Prothrombin Time 38.5 (H) 11.4 - 15.2 seconds   INR 4.1 (HH) 0.8 - 1.2    Comment: CRITICAL RESULT CALLED TO, READ BACK BY AND VERIFIED WITH: SARA DOSTER @ 0715 01/02/2020 PERRY, J. (NOTE) INR goal varies based on device and disease states. Performed at [facility], [address] [place], [place] [address]   CBG monitoring, ED     Status: Abnormal   Collection Time: 01/02/20  7:59 AM  Result Value Ref  Range   Glucose-Capillary 127 (H) 70 - 99 mg/dL    Comment: Glucose reference range applies only to samples taken after fasting for at least 8 hours.  CBG monitoring, ED     Status: Abnormal   Collection Time: 01/02/20 11:57 AM  Result Value Ref Range   Glucose-Capillary 124 (H) 70 - 99 mg/dL    Comment: Glucose reference range applies only to samples taken after fasting for at least 8 hours.   Comment 1 Notify RN    Comment 2 Document in Chart   CBG monitoring, ED     Status: Abnormal   Collection Time: 01/02/20  3:41 PM  Result Value Ref Range   Glucose-Capillary 139 (H) 70 - 99 mg/dL    Comment: Glucose reference range applies only to samples taken  after fasting for at least 8 hours.   CT Head Wo Contrast  Result Date: 01/01/2020 CLINICAL DATA:  Altered mental status. EXAM: CT HEAD WITHOUT CONTRAST TECHNIQUE: Contiguous axial images were obtained from the base of the skull through the vertex without intravenous contrast. COMPARISON:  None. FINDINGS: Brain: There is mild cerebral atrophy with widening of the extra-axial spaces and ventricular dilatation. There are areas of decreased attenuation within the white matter tracts of the supratentorial brain, consistent with microvascular disease changes Vascular: No hyperdense vessel or unexpected calcification. Skull: Normal. Negative for fracture or focal lesion. Sinuses/Orbits: No acute finding. Other: None. IMPRESSION: 1. Generalized cerebral atrophy. 2. No acute intracranial abnormality. Electronically Signed   By: Aram Candela M.D.   On: 01/01/2020 18:16   DG Chest Portable 1 View  Result Date: 01/01/2020 CLINICAL DATA:  Weakness EXAM: PORTABLE CHEST 1 VIEW COMPARISON:  05/17/2014 FINDINGS: Mild cardiac enlargement unchanged. Normal vascularity. Negative for heart failure. Atherosclerotic calcification aortic arch. Lungs are clear without infiltrate or effusion. IMPRESSION: No active disease. Electronically Signed   By: Marlan Palau M.D.   On: 01/01/2020 18:02    Pending Labs Unresulted Labs (From admission, onward) Comment          Start     Ordered   01/03/20 0500  Protime-INR  Daily,   R      01/02/20 0109   01/03/20 0500  Basic metabolic panel  Daily,   R     Question:  Specimen collection method  Answer:  Lab=Lab collect   01/02/20 0807   01/03/20 0500  CBC  Daily,   R     Question:  Specimen collection method  Answer:  Lab=Lab collect   01/02/20 0807   01/03/20 0500  Magnesium  Daily,   R     Question:  Specimen collection method  Answer:  Lab=Lab collect   01/02/20 0807   01/01/20 1805  Urine culture  ONCE - STAT,   STAT        01/01/20 1804   01/01/20 1730  CBC with  Differential  Once,   STAT        01/01/20 1730          Vitals/Pain Today's Vitals   01/02/20 1446 01/02/20 1530 01/02/20 1630 01/02/20 1700  BP: (!) 162/126 124/62 (!) 124/98 (!) 142/67  Pulse: 66 73 61 73  Resp: 20 (!) 21 20 (!) 24  Temp:      TempSrc:      SpO2: 96% 97% 98% 98%  Weight:      Height:      PainSc:        Isolation Precautions No active isolations  Medications Medications  cefTRIAXone (ROCEPHIN) 1 g in sodium chloride 0.9 % 100 mL IVPB (has no administration in time range)  sodium chloride flush (NS) 0.9 % injection 3 mL (3 mLs Intravenous Given 01/02/20 1024)  acetaminophen (TYLENOL) tablet 650 mg (has no administration in time range)    Or  acetaminophen (TYLENOL) suppository 650 mg (has no administration in time range)  HYDROcodone-acetaminophen (NORCO/VICODIN) 5-325 MG per tablet 1-2 tablet (has no administration in time range)  docusate sodium (COLACE) capsule 100 mg (100 mg Oral Given 01/02/20 1016)  ondansetron (ZOFRAN) tablet 4 mg (has no administration in time range)    Or  ondansetron (ZOFRAN) injection 4 mg (has no administration in time range)  insulin aspart (novoLOG) injection 0-9 Units (1 Units Subcutaneous Given 01/02/20 1628)  digoxin (LANOXIN) tablet 0.125 mg (0.125 mg Oral Given 01/02/20 1016)  FLUoxetine (PROZAC) capsule 20 mg (20 mg Oral Given 01/02/20 1016)  rosuvastatin (CRESTOR) tablet 20 mg (has no administration in time range)  Warfarin - Pharmacist Dosing Inpatient (has no administration in time range)  metoprolol tartrate (LOPRESSOR) tablet 12.5 mg (12.5 mg Oral Given 01/02/20 1018)  polyethylene glycol (MIRALAX / GLYCOLAX) packet 17 g (has no administration in time range)  senna-docusate (Senokot-S) tablet 2 tablet (has no administration in time range)  0.9 %  sodium chloride infusion ( Intravenous New Bag/Given 01/02/20 1014)  carbidopa-levodopa (SINEMET IR) 25-100 MG per tablet immediate release 1 tablet (1 tablet Oral Given 01/02/20 1100)     And  entacapone (COMTAN) tablet 200 mg (200 mg Oral Given 01/02/20 1059)  carbidopa-levodopa (SINEMET IR) 25-100 MG per tablet immediate release 2 tablet (has no administration in time range)    And  entacapone (COMTAN) tablet 400 mg (has no administration in time range)  metoprolol tartrate (LOPRESSOR) injection 5 mg (has no administration in time range)  lactated ringers bolus 1,000 mL (0 mLs Intravenous Stopped 01/01/20 2101)  acetaminophen (TYLENOL) tablet 650 mg (650 mg Oral Given 01/01/20 1916)  lactated ringers bolus 2,000 mL (0 mLs Intravenous Stopped 01/02/20 0058)  potassium chloride 10 mEq in 100 mL IVPB (0 mEq Intravenous Stopped 01/02/20 0548)  potassium chloride SA (KLOR-CON) CR tablet 20 mEq (20 mEq Oral Given 01/02/20 0118)    Mobility non-ambulatory

## 2020-01-02 NOTE — ED Notes (Signed)
Attempted to call report to 4E, but primary RN is unable to take report at this time. Charge RN states the RN will call back.

## 2020-01-02 NOTE — ED Notes (Signed)
PT at bedside.

## 2020-01-03 ENCOUNTER — Encounter (HOSPITAL_COMMUNITY): Payer: Self-pay | Admitting: Internal Medicine

## 2020-01-03 LAB — MAGNESIUM: Magnesium: 1.8 mg/dL (ref 1.7–2.4)

## 2020-01-03 LAB — GLUCOSE, CAPILLARY
Glucose-Capillary: 128 mg/dL — ABNORMAL HIGH (ref 70–99)
Glucose-Capillary: 129 mg/dL — ABNORMAL HIGH (ref 70–99)
Glucose-Capillary: 130 mg/dL — ABNORMAL HIGH (ref 70–99)
Glucose-Capillary: 139 mg/dL — ABNORMAL HIGH (ref 70–99)
Glucose-Capillary: 148 mg/dL — ABNORMAL HIGH (ref 70–99)
Glucose-Capillary: 156 mg/dL — ABNORMAL HIGH (ref 70–99)
Glucose-Capillary: 162 mg/dL — ABNORMAL HIGH (ref 70–99)

## 2020-01-03 LAB — BASIC METABOLIC PANEL
Anion gap: 8 (ref 5–15)
BUN: 11 mg/dL (ref 8–23)
CO2: 27 mmol/L (ref 22–32)
Calcium: 8.6 mg/dL — ABNORMAL LOW (ref 8.9–10.3)
Chloride: 104 mmol/L (ref 98–111)
Creatinine, Ser: 0.7 mg/dL (ref 0.44–1.00)
GFR calc Af Amer: >60 mL/min (ref >60)
GFR calc non Af Amer: >60 mL/min (ref >60)
Glucose, Bld: 141 mg/dL — ABNORMAL HIGH (ref 70–99)
Potassium: 2.9 mmol/L — ABNORMAL LOW (ref 3.5–5.1)
Sodium: 139 mmol/L (ref 135–145)

## 2020-01-03 LAB — CBC
HCT: 35.8 % — ABNORMAL LOW (ref 36.0–46.0)
Hemoglobin: 11.5 g/dL — ABNORMAL LOW (ref 12.0–15.0)
MCH: 30.8 pg (ref 26.0–34.0)
MCHC: 32.1 g/dL (ref 30.0–36.0)
MCV: 96 fL (ref 80.0–100.0)
Platelets: 220 10*3/uL (ref 150–400)
RBC: 3.73 MIL/uL — ABNORMAL LOW (ref 3.87–5.11)
RDW: 14.6 % (ref 11.5–15.5)
WBC: 8.3 10*3/uL (ref 4.0–10.5)
nRBC: 0 % (ref 0.0–0.2)

## 2020-01-03 LAB — PROTIME-INR
INR: 3.8 — ABNORMAL HIGH (ref 0.8–1.2)
Prothrombin Time: 36.5 seconds — ABNORMAL HIGH (ref 11.4–15.2)

## 2020-01-03 LAB — URINE CULTURE

## 2020-01-03 LAB — DIGOXIN LEVEL: Digoxin Level: 0.2 ng/mL — ABNORMAL LOW (ref 0.8–2.0)

## 2020-01-03 MED ORDER — POTASSIUM CHLORIDE CRYS ER 20 MEQ PO TBCR
40.0000 meq | EXTENDED_RELEASE_TABLET | Freq: Once | ORAL | Status: AC
  Administered 2020-01-03: 40 meq via ORAL
  Filled 2020-01-03: qty 2

## 2020-01-03 MED ORDER — POTASSIUM CHLORIDE 10 MEQ/100ML IV SOLN
10.0000 meq | INTRAVENOUS | Status: AC
  Administered 2020-01-03 (×4): 10 meq via INTRAVENOUS
  Filled 2020-01-03: qty 100

## 2020-01-03 MED ORDER — SODIUM CHLORIDE 0.9 % IV SOLN: INTRAVENOUS | Status: AC

## 2020-01-03 MED ORDER — MAGNESIUM SULFATE 2 GM/50ML IV SOLN
2.0000 g | Freq: Once | INTRAVENOUS | Status: AC
  Administered 2020-01-03: 2 g via INTRAVENOUS
  Filled 2020-01-03: qty 50

## 2020-01-03 NOTE — Progress Notes (Addendum)
PROGRESS NOTE    Christy Pham  AJO:[id number] DOB: 1945-08-10 DOA: 01/01/2020 PCP: Barbie Banner, MD   Brief Narrative:  74 year old with history of A. fib on Coumadin, DM2, HLD, Parkinson's, essential hypertension presents for worsening confusion at home, unsteady.  In the ER noted to have sepsis secondary to urinary tract infection with A. fib with RVR.  Started on empiric Rocephin, CT head negative for acute pathology.  At night she has been bradycardic with brief pause on telemetry.   Assessment & Plan:   Active Problems:   ATRIAL FIBRILLATION WITH RAPID VENTRICULAR RESPONSE   Chronic systolic heart failure (HCC)   Parkinsonism (HCC)   Lower urinary tract infectious disease   Dehydration   Essential hypertension   Sepsis (HCC)   Hypokalemia   Acute metabolic encephalopathy   Hallucinations  Sepsis secondary to urinary tract infection, POA Acute metabolic encephalopathy Mild to moderate dehydration -Sepsis physiology improving, UA is positive. -Blood cultures-negative -Urine culture-pending -Empiric IV Rocephin -Lactic acid has trended down. -CT head-chronic atrophy, no acute changes -Chest x-ray unremarkable.  Procalcitonin negative -Normal saline 100 cc/h  Atrial fibrillation with RVR now with bradycardia and pauses -Secondary to underlying infection.  Sure she has some form of sick sinus/OSA.  -Hold metoprolol and digoxin.  IV Lopressor as needed -Check digoxin level.  Replete electrolytes aggressively. -Coumadin per pharmacy -Echocardiogram July 2020-EF 60%. Case discussed with Dr Mayford Knife 8/8, she will arrange for outpatient follow up and possible ambulatory monitoring otherwise continue management as mentioned above. -CPAP here while sleeping  Hypokalemia -Aggressive repletion of electrolytes.  Chronic congestive heart failure with preserved ejection fraction, 60 to 65% -Intravascular volume depletion therefore getting hydration. -Supportive  care.  Essential hypertension -Continue metoprolol and digoxin  History of Parkinson's disease -Continue home Stalevo  Diabetes mellitus type 2 -Insulin sliding scale and Accu-Cheks -A1c 6.7.  TSH within normal limits. -Home Metformin on hold  Speech-mild aspiration risk, regular, nectar thick liquid diet PT-SNF  DVT prophylaxis: Coumadin Code Status: DNR Family Communication:  Spoke with son.   Status is: Inpatient  Remains inpatient appropriate because:IV treatments appropriate due to intensity of illness or inability to take PO   Dispo: The patient is from: Home              Anticipated d/c is to: SNF              Anticipated d/c date is: 2 days              Patient currently is not medically stable to d/c.  Maintain hospital stay for aggressive repletion of her potassium, monitor bradycardia with pauses and IV antibiotics for her underlying infection while awaiting culture data.  Patient she will also need SNF placement as determined by PT       Body mass index is 34.87 kg/m.  Pressure Ulcer 08/14/11 (Active)  08/14/11 0030  Location: Leg  Location Orientation:   Staging:   Wound Description (Comments):   Present on Admission:      Pressure Ulcer 08/15/11 Stage II -  Partial thickness loss of dermis presenting as a shallow open ulcer with a red, pink wound bed without slough. red, scaly (Active)  08/15/11 0834  Location: Heel  Location Orientation: Right;Left  Staging: Stage II -  Partial thickness loss of dermis presenting as a shallow open ulcer with a red, pink wound bed without slough.  Wound Description (Comments): red, scaly  Present on Admission:  Subjective: Overnight on telemetry patient had sinus bradycardia with couple of 4-second pauses.  Patient was resting comfortably did not have any complaints.  This morning seen and examined at bedside she feels better than yesterday no complaints. I went over the possibility of sleep apnea, she is  willing to try CPAP during the hospital and see if it helps.  If so she is willing to pursue it as outpatient.  Review of Systems Otherwise negative except as per HPI, including: General: Denies fever, chills, night sweats or unintended weight loss. Resp: Denies cough, wheezing, shortness of breath. Cardiac: Denies chest pain, palpitations, orthopnea, paroxysmal nocturnal dyspnea. GI: Denies abdominal pain, nausea, vomiting, diarrhea or constipation GU: Denies dysuria, frequency, hesitancy or incontinence MS: Denies muscle aches, joint pain or swelling Neuro: Denies headache, neurologic deficits (focal weakness, numbness, tingling), abnormal gait Psych: Denies anxiety, depression, SI/HI/AVH Skin: Denies new rashes or lesions ID: Denies sick contacts, exotic exposures, travel  Examination: Constitutional: Elderly frail Respiratory: Clear to auscultation bilaterally Cardiovascular: Normal sinus rhythm, no rubs Abdomen: Nontender nondistended good bowel sounds Musculoskeletal: No edema noted Skin: No rashes seen Neurologic: CN 2-12 grossly intact.  And nonfocal. Slightly tremulous.  Psychiatric: Normal judgment and insight. Alert and oriented x 3. Normal mood.     Objective: Vitals:   01/02/20 1828 01/02/20 2032 01/03/20 0116 01/03/20 0528  BP: (!) 128/93 (!) 163/93 (!) 147/78 (!) 165/94  Pulse: 67 81 (!) 44 (!) 54  Resp: 18 (!) 24 20 (!) 22  Temp: 97.7 F (36.5 C) 98.5 F (36.9 C) (!) 97.4 F (36.3 C) (!) 97.5 F (36.4 C)  TempSrc: Oral Oral Oral Oral  SpO2: 98% 97% 100% 100%  Weight:      Height:        Intake/Output Summary (Last 24 hours) at 01/03/2020 0733 Last data filed at 01/03/2020 2637 Gross per 24 hour  Intake 936.75 ml  Output 800 ml  Net 136.75 ml   Filed Weights   01/01/20 1718  Weight: 90.7 kg     Data Reviewed:   CBC: Recent Labs  Lab 01/01/20 2124 01/02/20 0549 01/03/20 0601  WBC 11.2* 9.6 8.3  NEUTROABS 7.3 4.7  --   HGB 12.0 11.4* 11.5*   HCT 35.8* 34.1* 35.8*  MCV 93.0 94.2 96.0  PLT 268 234 220   Basic Metabolic Panel: Recent Labs  Lab 01/01/20 1830 01/02/20 0018 01/02/20 0549 01/03/20 0601  NA 140  --  143 139  K 3.1*  --  3.8 2.9*  CL 103  --  107 104  CO2 25  --  27 27  GLUCOSE 148*  --  114* 141*  BUN 13  --  10 11  CREATININE 0.80  --  0.67 0.70  CALCIUM 9.1  --  8.6* 8.6*  MG  --  1.8 1.9 1.8  PHOS  --   --  3.0  --    GFR: Estimated Creatinine Clearance: 65.6 mL/min (by C-G formula based on SCr of 0.7 mg/dL). Liver Function Tests: Recent Labs  Lab 01/01/20 1830 01/02/20 0549  AST 28 20  ALT 22 20  ALKPHOS 55 45  BILITOT 1.4* 0.9  PROT 6.9 5.8*  ALBUMIN 3.8 3.1*   No results for input(s): LIPASE, AMYLASE in the last 168 hours. No results for input(s): AMMONIA in the last 168 hours. Coagulation Profile: Recent Labs  Lab 01/01/20 1830 01/02/20 0549 01/03/20 0601  INR 3.4* 4.1* 3.8*   Cardiac Enzymes: No results for input(s): CKTOTAL,  CKMB, CKMBINDEX, TROPONINI in the last 168 hours. BNP (last 3 results) No results for input(s): PROBNP in the last 8760 hours. HbA1C: Recent Labs    01/01/20 2124  HGBA1C 6.1*   CBG: Recent Labs  Lab 01/02/20 1541 01/02/20 2036 01/03/20 0114 01/03/20 0531 01/03/20 0728  GLUCAP 139* 113* 148* 129* 156*   Lipid Profile: No results for input(s): CHOL, HDL, LDLCALC, TRIG, CHOLHDL, LDLDIRECT in the last 72 hours. Thyroid Function Tests: Recent Labs    01/02/20 0549  TSH 2.402   Anemia Panel: No results for input(s): VITAMINB12, FOLATE, FERRITIN, TIBC, IRON, RETICCTPCT in the last 72 hours. Sepsis Labs: Recent Labs  Lab 01/01/20 1830 01/01/20 2004 01/02/20 0018 01/02/20 0234 01/02/20 0549  PROCALCITON  --   --  <0.10  --   --   LATICACIDVEN 2.9* 3.4*  --  2.0* 1.2    Recent Results (from the past 240 hour(s))  SARS Coronavirus 2 by RT PCR (hospital order, performed in [facility] hospital lab) Nasopharyngeal Nasopharyngeal Swab      Status: None   Collection Time: 01/01/20  5:33 PM   Specimen: Nasopharyngeal Swab  Result Value Ref Range Status   SARS Coronavirus 2 NEGATIVE NEGATIVE Final    Comment: (NOTE) SARS-CoV-2 target nucleic acids are NOT DETECTED.  The SARS-CoV-2 RNA is generally detectable in upper and lower respiratory specimens during the acute phase of infection. The lowest concentration of SARS-CoV-2 viral copies this assay can detect is 250 copies / mL. A negative result does not preclude SARS-CoV-2 infection and should not be used as the sole basis for treatment or other patient management decisions.  A negative result may occur with improper specimen collection / handling, submission of specimen other than nasopharyngeal swab, presence of viral mutation(s) within the areas targeted by this assay, and inadequate number of viral copies (<250 copies / mL). A negative result must be combined with clinical observations, patient history, and epidemiological information.  Fact Sheet for Patients:   BoilerBrush.com.cy  Fact Sheet for Healthcare Providers: https://pope.com/  This test is not yet approved or  cleared by the [place] FDA and has been authorized for detection and/or diagnosis of SARS-CoV-2 by FDA under an Emergency Use Authorization (EUA).  This EUA will remain in effect (meaning this test can be used) for the duration of the COVID-19 declaration under Section 564(b)(1) of the Act, 21 U.S.C. section 360bbb-3(b)(1), unless the authorization is terminated or revoked sooner.  Performed at [facility], [address], [place], [place] [address]   Culture, blood (routine x 2)     Status: None (Preliminary result)   Collection Time: 01/01/20  6:30 PM   Specimen: BLOOD  Result Value Ref Range Status   Specimen Description   Final    BLOOD LEFT HAND Performed at [facility], [address],  [place], [place] [address]    Special Requests   Final    BOTTLES DRAWN AEROBIC AND ANAEROBIC Blood Culture adequate volume Performed at [facility], [address] [place], [place] [address]    Culture   Final    NO GROWTH < 12 HOURS Performed at [facility], [address] [place], [place] [address]    Report Status PENDING  Incomplete  Culture, blood (routine x 2)     Status: None (Preliminary result)   Collection Time: 01/01/20  6:30 PM   Specimen: BLOOD  Result Value Ref Range Status   Specimen Description  Final    BLOOD RIGHT HAND Performed at [facility], [address] [place], [place] [address]    Special Requests   Final    BOTTLES DRAWN AEROBIC AND ANAEROBIC Blood Culture adequate volume Performed at [facility], [address] [place], [place] [address]    Culture   Final    NO GROWTH < 12 HOURS Performed at [facility], [address] [place], [place] [address]    Report Status PENDING  Incomplete         Radiology Studies: CT Head Wo Contrast  Result Date: 01/01/2020 CLINICAL DATA:  Altered mental status. EXAM: CT HEAD WITHOUT CONTRAST TECHNIQUE: Contiguous axial images were obtained from the base of the skull through the vertex without intravenous contrast. COMPARISON:  None. FINDINGS: Brain: There is mild cerebral atrophy with widening of the extra-axial spaces and ventricular dilatation. There are areas of decreased attenuation within the white matter tracts of the supratentorial brain, consistent with microvascular disease changes Vascular: No hyperdense vessel or unexpected calcification. Skull: Normal. Negative for fracture or focal lesion. Sinuses/Orbits: No acute finding. Other: None. IMPRESSION: 1. Generalized cerebral atrophy. 2. No acute intracranial abnormality. Electronically Signed   By: Aram Candelahaddeus  Houston M.D.   On: 01/01/2020 18:16   DG Chest Portable 1 View  Result Date:  01/01/2020 CLINICAL DATA:  Weakness EXAM: PORTABLE CHEST 1 VIEW COMPARISON:  05/17/2014 FINDINGS: Mild cardiac enlargement unchanged. Normal vascularity. Negative for heart failure. Atherosclerotic calcification aortic arch. Lungs are clear without infiltrate or effusion. IMPRESSION: No active disease. Electronically Signed   By: Marlan Palauharles  Clark M.D.   On: 01/01/2020 18:02        Scheduled Meds: . carbidopa-levodopa  1 tablet Oral Daily   And  . entacapone  200 mg Oral Daily  . carbidopa-levodopa  2 tablet Oral 2 times per day   And  . entacapone  400 mg Oral 2 times per day  . digoxin  0.125 mg Oral Daily  . docusate sodium  100 mg Oral BID  . FLUoxetine  20 mg Oral Daily  . insulin aspart  0-9 Units Subcutaneous Q4H  . metoprolol tartrate  12.5 mg Oral BID  . potassium chloride  40 mEq Oral Once  . rosuvastatin  20 mg Oral QHS  . sodium chloride flush  3 mL Intravenous Q12H  . Warfarin - Pharmacist Dosing Inpatient   Does not apply q1600   Continuous Infusions: . sodium chloride Stopped (01/03/20 0707)  . cefTRIAXone (ROCEPHIN)  IV Stopped (01/02/20 2149)  . magnesium sulfate bolus IVPB    . potassium chloride       LOS: 2 days   Time spent= 35 mins    Chenoa Luddy Joline Maxcyhirag Johnnie Goynes, MD [facility]  If 7PM-7AM, please contact night-coverage  01/03/2020, 7:33 AM

## 2020-01-03 NOTE — Progress Notes (Signed)
ANTICOAGULATION CONSULT NOTE - Initial Consult  Pharmacy Consult for Warfarin Indication: atrial fibrillation  No Known Allergies  Patient Measurements: Height: 5' 3.5" (161.3 cm) Weight: 90.7 kg (200 lb) IBW/kg (Calculated) : 53.55  Vital Signs: Temp: 97.5 F (36.4 C) (08/08 0528) Temp Source: Oral (08/08 0528) BP: 165/94 (08/08 0528) Pulse Rate: 54 (08/08 0528)  Labs: Recent Labs    01/01/20 1830 01/01/20 2124 01/01/20 2124 01/02/20 0018 01/02/20 0234 01/02/20 0549 01/03/20 0601  HGB  --  12.0   < >  --   --  11.4* 11.5*  HCT  --  35.8*  --   --   --  34.1* 35.8*  PLT  --  268  --   --   --  234 220  LABPROT 33.4*  --   --   --   --  38.5* 36.5*  INR 3.4*  --   --   --   --  4.1* 3.8*  CREATININE 0.80  --   --   --   --  0.67 0.70  TROPONINIHS  --   --   --  14 14  --   --    < > = values in this interval not displayed.    Estimated Creatinine Clearance: 65.6 mL/min (by C-G formula based on SCr of 0.7 mg/dL).   Medical History: Past Medical History:  Diagnosis Date  . Chronic atrial fibrillation (HCC)   . Depression   . Diabetes (HCC)   . Hyperlipidemia    dx circa 2008  . Hypertension    dx circa 2008  . Mitral regurgitation    mild to moderate by echo 2013  . Osteoarthritis    "L knee primarily" (05/17/2014)  . Parkinson's disease    dx circa 1990 by Dr. Thad Ranger    Medications:  PTA Warfarin 7.5mg  daily - last dose taken 8/6 @ 0830  Assessment: 74 yr female admitted with sepsis due to UTI and AFib with RVR on warfarin PTA. Pharmacy consulted to dose warfarin on admission.   Home dose: Warfarin 7.5 mg PO daily Last dose: 8/6  Today, 01/03/20  INR = 3.8 remains SUPRAtherapeutic  CBC: Hgb slightly low but stable; Plt WNL  Diet: Carb modified; intake not charted  Goal of Therapy:  INR 2-3   Plan:   Hold warfarin again today given elevated INR  Recheck INR with AM labs tomorrow  Cindi Carbon, PharmD 01/03/2020,9:39 AM

## 2020-01-03 NOTE — Progress Notes (Signed)
4.08 sec pause on tele, vs wnl, patient alert, hospitalist notified, will continue to monitor

## 2020-01-03 NOTE — Progress Notes (Signed)
Initial Nutrition Assessment  DOCUMENTATION CODES:   Obesity unspecified  INTERVENTION:   -Hormel Shake daily, each provides 520 kcals and 22g protein  NUTRITION DIAGNOSIS:   Increased nutrient needs related to chronic illness (CHF, parkinson's disease) as evidenced by estimated needs.  GOAL:   Patient will meet greater than or equal to 90% of their needs  MONITOR:   PO intake, Supplement acceptance, Labs, Weight trends, I & O's  REASON FOR ASSESSMENT:   Consult Assessment of nutrition requirement/status  ASSESSMENT:   74 year old with history of A. fib on Coumadin, DM2, HLD, Parkinson's, essential hypertension presents for worsening confusion at home, unsteady.  In the ER noted to have sepsis secondary to urinary tract infection with A. fib with RVR.  Started on empiric Rocephin, CT head negative for acute pathology.  At night she has been bradycardic with brief pause on telemetry.   Unable to speak with pt at this time. Will gather nutrition history at follow-up.  No PO documented in chart.  Per SLP note, pt is recommended a regular diet with nectar thick liquids. Pt requires feeding assistance given tremors r/t Parkinson's. Will order Hormel shake for pt as these are nectar thick in consistency.   Per weight records, no significant weight changes noted.  Medications: Colace, KLOR-CON, IV Mg sulfate Labs reviewed: CBGs: 129-156 Low K  NUTRITION - FOCUSED PHYSICAL EXAM:  Unable to complete  Diet Order:   Diet Order            Diet Carb Modified Fluid consistency: Thin; Room service appropriate? Yes  Diet effective now                 EDUCATION NEEDS:   No education needs have been identified at this time  Skin:  Skin Assessment: Reviewed RN Assessment  Last BM:  PTA  Height:   Ht Readings from Last 1 Encounters:  01/01/20 5' 3.5" (1.613 m)    Weight:   Wt Readings from Last 1 Encounters:  01/01/20 90.7 kg    BMI:  Body mass index is 34.87  kg/m.  Estimated Nutritional Needs:   Kcal:  1700-1900  Protein:  75-90g  Fluid:  1.7L/day  Tilda Franco, MS, RD, LDN Inpatient Clinical Dietitian Contact information available via Amion

## 2020-01-03 NOTE — Progress Notes (Signed)
HR as low as 30, pauses 2.5 seconds occasional. First night so unsure of baseline. BS and other VS/ LOC WNL. Patient sleeping but arousable/alert. Breathing regular, no sleep apnea noted. Respirations sound clear. She is on fluid at 100 cc/hr for sepsis and has systolic HF, no signs of FVO. HR maintaing in the 40s. will continue to monitor. pt also on digoxin.  NP hospital [place] notified through secure paging and responded.

## 2020-01-03 NOTE — Evaluation (Signed)
Occupational Therapy Evaluation Patient Details Name: Christy Pham MRN: [id number] DOB: 1945/11/14 Today's Date: 01/03/2020    History of Present Illness 74 year old with history of A. fib on Coumadin, DM2, HLD, Parkinson's, essential hypertension presents for worsening confusion at home, unsteady.  In the ER noted to have sepsis secondary to urinary tract infection with A. fib with RVR   Clinical Impression   Pt admitted with confusion Pt currently with functional limitations due to the deficits listed below (see OT Problem List).  Pt will benefit from skilled OT to increase their safety and independence with ADL and functional mobility for ADL to facilitate discharge to venue listed below.   Feel pt will need SNF. Do not feel husband can care for pt at current level.    OT session this day focused on self feeding with weighted fork and cup    Follow Up Recommendations  SNF    Equipment Recommendations  None recommended by OT    Recommendations for Other Services       Precautions / Restrictions Precautions Precautions: Fall Precaution Comments: tremulous extremities and trunk      Mobility Bed Mobility        NT          Transfers              NT         Balance                                           ADL either performed or assessed with clinical judgement   ADL Overall ADL's : Needs assistance/impaired Eating/Feeding: Moderate assistance;Bed level Eating/Feeding Details (indicate cue type and reason): HOB raised.  Weights fork and cup. Pt thankful for both and did have increased I using this items Grooming: Wash/dry hands;Wash/dry face;Bed level Grooming Details (indicate cue type and reason): HOB raised                               General ADL Comments: OT session focused on self feeding.Increased time and AE used     Vision          Extremity/Trunk Assessment Upper Extremity Assessment Upper Extremity  Assessment: Generalized weakness (tremors noted with self feeding.  AE provided)           Communication Communication Communication: No difficulties   Cognition Arousal/Alertness: Awake/alert Behavior During Therapy: WFL for tasks assessed/performed Overall Cognitive Status: History of cognitive impairments - at baseline                                 General Comments: hx dementia   General Comments               Home Living Family/patient expects to be discharged to:: Private residence Living Arrangements: Spouse/significant other Available Help at Discharge: Family;Personal care attendant Type of Home: House Home Access: Ramped entrance     Home Layout: One level     Bathroom Shower/Tub: Walk-in shower         Home Equipment: Environmental consultant - 2 wheels;Wheelchair - Engineer, technical sales - power          Prior Functioning/Environment Level of Independence: Needs assistance  Gait / Transfers Assistance Needed: sleeps in lift chair, stands and turns  180* to manual w/c, does not ambulate, also has power w/c but typically uses manual w/c              OT Problem List: Decreased strength;Impaired balance (sitting and/or standing);Decreased activity tolerance;Decreased knowledge of use of DME or AE;Impaired UE functional use;Obesity      OT Treatment/Interventions: Self-care/ADL training;Patient/family education;Therapeutic activities;DME and/or AE instruction    OT Goals(Current goals can be found in the care plan section) Acute Rehab OT Goals Patient Stated Goal: be able to do some for myself OT Goal Formulation: With patient Time For Goal Achievement: 01/17/20 Potential to Achieve Goals: Fair  OT Frequency: Min 2X/week              AM-PAC OT "6 Clicks" Daily Activity     Outcome Measure Help from another person eating meals?: A Lot Help from another person taking care of personal grooming?: A Lot Help from another person toileting, which  includes using toliet, bedpan, or urinal?: Total Help from another person bathing (including washing, rinsing, drying)?: A Lot Help from another person to put on and taking off regular upper body clothing?: A Lot Help from another person to put on and taking off regular lower body clothing?: Total 6 Click Score: 10   End of Session Equipment Utilized During Treatment: Other (comment) (AE for eating)  Activity Tolerance:   Patient left:    OT Visit Diagnosis: Muscle weakness (generalized) (M62.81);Other abnormalities of gait and mobility (R26.89);History of falling (Z91.81);Unsteadiness on feet (R26.81)                Time: 1100-1115 OT Time Calculation (min): 15 min Charges:  OT General Charges $OT Visit: 1 Visit OT Evaluation $OT Eval Moderate Complexity: 1 Mod  Lise Auer, OT Acute Rehabilitation Services [phone] Office- [phone]     Bryssa Tones, Karin Golden D 01/03/2020, 1:21 PM

## 2020-01-04 ENCOUNTER — Telehealth: Payer: Self-pay

## 2020-01-04 DIAGNOSIS — I4819 Other persistent atrial fibrillation: Secondary | ICD-10-CM

## 2020-01-04 DIAGNOSIS — I482 Chronic atrial fibrillation, unspecified: Secondary | ICD-10-CM

## 2020-01-04 LAB — GLUCOSE, CAPILLARY
Glucose-Capillary: 123 mg/dL — ABNORMAL HIGH (ref 70–99)
Glucose-Capillary: 134 mg/dL — ABNORMAL HIGH (ref 70–99)
Glucose-Capillary: 174 mg/dL — ABNORMAL HIGH (ref 70–99)
Glucose-Capillary: 116 mg/dL — ABNORMAL HIGH (ref 70–99)
Glucose-Capillary: 146 mg/dL — ABNORMAL HIGH (ref 70–99)

## 2020-01-04 LAB — BASIC METABOLIC PANEL
Anion gap: 8 (ref 5–15)
BUN: 11 mg/dL (ref 8–23)
CO2: 25 mmol/L (ref 22–32)
Calcium: 9.1 mg/dL (ref 8.9–10.3)
Chloride: 105 mmol/L (ref 98–111)
Creatinine, Ser: 0.71 mg/dL (ref 0.44–1.00)
GFR calc Af Amer: >60 mL/min (ref >60)
GFR calc non Af Amer: >60 mL/min (ref >60)
Glucose, Bld: 132 mg/dL — ABNORMAL HIGH (ref 70–99)
Potassium: 4.7 mmol/L (ref 3.5–5.1)
Sodium: 138 mmol/L (ref 135–145)

## 2020-01-04 LAB — CBC
HCT: 44.2 % (ref 36.0–46.0)
Hemoglobin: 13.8 g/dL (ref 12.0–15.0)
MCH: 30.5 pg (ref 26.0–34.0)
MCHC: 31.2 g/dL (ref 30.0–36.0)
MCV: 97.6 fL (ref 80.0–100.0)
Platelets: 248 10*3/uL (ref 150–400)
RBC: 4.53 MIL/uL (ref 3.87–5.11)
RDW: 14.5 % (ref 11.5–15.5)
WBC: 8 10*3/uL (ref 4.0–10.5)
nRBC: 0 % (ref 0.0–0.2)

## 2020-01-04 LAB — MAGNESIUM: Magnesium: 2.3 mg/dL (ref 1.7–2.4)

## 2020-01-04 LAB — PROTIME-INR
INR: 3 — ABNORMAL HIGH (ref 0.8–1.2)
Prothrombin Time: 29.9 seconds — ABNORMAL HIGH (ref 11.4–15.2)

## 2020-01-04 MED ORDER — CEPHALEXIN 500 MG PO CAPS: 500.0000 mg | ORAL_CAPSULE | Freq: Four times a day (QID) | ORAL | 0 refills | Status: AC

## 2020-01-04 MED ORDER — WARFARIN SODIUM 2.5 MG PO TABS
2.5000 mg | ORAL_TABLET | Freq: Once | ORAL | Status: AC
  Administered 2020-01-04: 2.5 mg via ORAL
  Filled 2020-01-04: qty 1

## 2020-01-04 NOTE — TOC Progression Note (Signed)
Transition of Care [facility]) - Progression Note    Patient Details  Name: Christy Pham MRN: [id number] Date of Birth: [birth year]  Transition of Care [facility]) CM/SW Contact  Armanda Heritage, RN Phone Number: 01/04/2020, 3:22 PM  Clinical Narrative:    Patient provided with bed offers.  Patient reports she is unsure which facility to select and requests CM contact her son.  CM spoke with son and provided bed offers.  SON states will review ratings and notify CM once he makes a selection.    Expected Discharge Plan: Skilled Nursing Facility Barriers to Discharge: Continued Medical Work up  Expected Discharge Plan and Services Expected Discharge Plan: Skilled Nursing Facility   Discharge Planning Services: CM Consult Post Acute Care Choice: Skilled Nursing Facility Living arrangements for the past 2 months: Single Family Home                 DME Arranged: N/A DME Agency: NA       HH Arranged: NA HH Agency: NA         Social Determinants of Health (SDOH) Interventions    Readmission Risk Interventions No flowsheet data found.

## 2020-01-04 NOTE — TOC Initial Note (Signed)
Transition of Care [facility]) - Initial/Assessment Note    Patient Details  Name: Christy Pham MRN: [id number] Date of Birth: Oct 31, 1945  Transition of Care [facility]) CM/SW Contact:    Armanda Heritage, RN Phone Number: 01/04/2020, 10:22 AM  Clinical Narrative:    CM spoke with patient regarding recommendation for SNF at discharge for short term rehab.  Patient is in agreement.  SNF search initiated, [facility] faxed out to area facilities.                Expected Discharge Plan: Skilled Nursing Facility Barriers to Discharge: Continued Medical Work up   Patient Goals and CMS Choice Patient states their goals for this hospitalization and ongoing recovery are:: to go to rehab and then home CMS Medicare.gov Compare Post Acute Care list provided to:: Patient Choice offered to / list presented to : Patient  Expected Discharge Plan and Services Expected Discharge Plan: Skilled Nursing Facility   Discharge Planning Services: CM Consult Post Acute Care Choice: Skilled Nursing Facility Living arrangements for the past 2 months: Single Family Home                 DME Arranged: N/A DME Agency: NA       HH Arranged: NA HH Agency: NA        Prior Living Arrangements/Services Living arrangements for the past 2 months: Single Family Home Lives with:: Spouse Patient language and need for interpreter reviewed:: Yes Do you feel safe going back to the place where you live?: Yes      Need for Family Participation in Patient Care: Yes (Comment) Care giver support system in place?: Yes (comment)   Criminal Activity/Legal Involvement Pertinent to Current Situation/Hospitalization: No - Comment as needed  Activities of Daily Living Home Assistive Devices/Equipment: Eyeglasses, Wheelchair ADL Screening (condition at time of admission) Patient's cognitive ability adequate to safely complete daily activities?: Yes Is the patient deaf or have difficulty hearing?: No Does the patient have  difficulty seeing, even when wearing glasses/contacts?: No Does the patient have difficulty concentrating, remembering, or making decisions?: Yes Patient able to express need for assistance with ADLs?: Yes Does the patient have difficulty dressing or bathing?: Yes Independently performs ADLs?: No Communication: Independent Dressing (OT): Needs assistance Is this a change from baseline?: Pre-admission baseline Grooming: Needs assistance Is this a change from baseline?: Pre-admission baseline Feeding: Independent Bathing: Needs assistance Is this a change from baseline?: Pre-admission baseline Toileting: Needs assistance Is this a change from baseline?: Pre-admission baseline In/Out Bed: Needs assistance Is this a change from baseline?: Pre-admission baseline Walks in Home: Dependent (wheelchair bound) Is this a change from baseline?: Pre-admission baseline Does the patient have difficulty walking or climbing stairs?: Yes Weakness of Legs: Both Weakness of Arms/Hands: Both  Permission Sought/Granted                  Emotional Assessment Appearance:: Appears stated age Attitude/Demeanor/Rapport: Engaged Affect (typically observed): Accepting Orientation: : Oriented to Self, Oriented to Place, Oriented to  Time, Oriented to Situation   Psych Involvement: No (comment)  Admission diagnosis:  Persistent atrial fibrillation (HCC) [I48.19] Sepsis (HCC) [A41.9] Urinary tract infection without hematuria, site unspecified [N39.0] Patient Active Problem List   Diagnosis Date Noted  . Hypokalemia 01/02/2020  . Acute metabolic encephalopathy 01/02/2020  . Hallucinations 01/02/2020  . Sepsis (HCC) 01/01/2020  . Mitral regurgitation   . Lower urinary tract infectious disease 05/17/2014  . Atrial fibrillation with RVR (HCC) 05/17/2014  . Hypertension   .  Hyperlipidemia   . Dehydration   . Essential hypertension   . 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 Depression 11/05/2012  .  Parkinsonism 09/22/2012  . Depressive disorder, not elsewhere classified 09/22/2012  . Abnormality of gait 09/22/2012  . OBESITY, UNSPECIFIED 01/25/2010  . ATRIAL FIBRILLATION WITH RAPID VENTRICULAR RESPONSE 01/25/2010  . Chronic systolic heart failure (HCC) 01/25/2010   PCP:  Barbie Banner, MD Pharmacy:   [facility] [address] [place] - [address] [place] [address] Phone: [phone] Fax: [phone]  [facility] - [address], [place] - [address] [place] [address] Phone: [phone] Fax: [phone]     Social Determinants of Health (SDOH) Interventions    Readmission Risk Interventions No flowsheet data found.

## 2020-01-04 NOTE — Progress Notes (Signed)
PROGRESS NOTE    Christy Pham  FAO:[id number] DOB: 07/17/1945 DOA: 01/01/2020 PCP: Barbie Banner, MD   Brief Narrative:  74 year old with history of A. fib on Coumadin, DM2, HLD, Parkinson's, essential hypertension presents for worsening confusion at home, unsteady.  In the ER noted to have sepsis secondary to urinary tract infection with A. fib with RVR.  Started on empiric Rocephin, CT head negative for acute pathology.  At night she has been bradycardic with brief pause on telemetry.  During the hospital she was placed on CPAP which helped there.  Seen by PT/OT recommended SNF.  Over the course of 2-3 days symptomatically started feeling much better and was transitioned to rehab.   Assessment & Plan:   Active Problems:   ATRIAL FIBRILLATION WITH RAPID VENTRICULAR RESPONSE   Chronic systolic heart failure (HCC)   Parkinsonism (HCC)   Lower urinary tract infectious disease   Dehydration   Essential hypertension   Sepsis (HCC)   Hypokalemia   Acute metabolic encephalopathy   Hallucinations  Sepsis secondary to urinary tract infection, POA Acute metabolic encephalopathy Mild to moderate dehydration -Sepsis physiology has resolved.  Blood cultures remain negative.  We will transition IV Rocephin to p.o. Keflex upon discharge, last day 01/07/2020. -CT head-chronic atrophy, no acute changes -Chest x-ray unremarkable.  Procalcitonin negative  Atrial fibrillation with RVR now with bradycardia and pauses -Bradycardia has slightly improved.  Suspect OSA versus sick sinus syndrome.  Spoke with cardiology, Dr. Mayford Knife on 8/8 will arrange for outpatient follow-up including ambulatory monitoring. -Digoxin level slightly low, should resume metoprolol and digoxin upon discharge -Coumadin per pharmacy.  INR levels will need to be closely monitored outpatient. -Echocardiogram July 2020-EF 60%. -We will need outpatient sleep study  Hypokalemia -Aggressive repletion of electrolytes.  Chronic  congestive heart failure with preserved ejection fraction, 60 to 65% -Appears more or less euvolemic. -Supportive care.  Essential hypertension -Continue metoprolol and digoxin  History of Parkinson's disease -Continue home Stalevo  Diabetes mellitus type 2 -Insulin sliding scale and Accu-Cheks -A1c 6.7.  TSH within normal limits. -Home Metformin on hold  Speech-mild aspiration risk, regular, nectar thick liquid diet PT-SNF  DVT prophylaxis: Coumadin Code Status: DNR Family Communication: Spoke with son yesterday, called him today and left him a voicemail  Status is: Inpatient  Remains inpatient appropriate because:IV treatments appropriate due to intensity of illness or inability to take PO   Dispo: The patient is from: Home              Anticipated d/c is to: SNF              Anticipated d/c date is: 1 day              Patient currently medically doing better and overall stable to be transferred to SNF when bed available.  TOC team has been informed   Body mass index is 34.87 kg/m.  Pressure Ulcer 08/14/11 (Active)  08/14/11 0030  Location: Leg  Location Orientation:   Staging:   Wound Description (Comments):   Present on Admission:      Pressure Ulcer 08/15/11 Stage II -  Partial thickness loss of dermis presenting as a shallow open ulcer with a red, pink wound bed without slough. red, scaly (Active)  08/15/11 0834  Location: Heel  Location Orientation: Right;Left  Staging: Stage II -  Partial thickness loss of dermis presenting as a shallow open ulcer with a red, pink wound bed without slough.  Wound Description (Comments):  red, scaly  Present on Admission:       Subjective: Seen and examined at bedside, tolerated CPAP overnight.  Tells me she feels overall better but is just generally physically weak therefore okay with going to rehab.  Review of Systems Otherwise negative except for mentioned above.   Examination: Constitutional:, Elderly frail,  overall very pleasant Respiratory: Clear to auscultation bilaterally Cardiovascular: Normal sinus rhythm, no rubs Abdomen: Nontender nondistended good bowel sounds Musculoskeletal: No edema noted Skin: No rashes seen Neurologic: CN 2-12 grossly intact.  And nonfocal, does have resting tremor Psychiatric: Normal judgment and insight. Alert and oriented x 3. Normal mood.    Objective: Vitals:   01/04/20 0429 01/04/20 0435 01/04/20 0616 01/04/20 0840  BP: (!) 161/119 (!) 161/119 (!) 155/94 95/60  Pulse: 69  (!) 53 98  Resp: (!) 22  (!) 22 18  Temp: 97.7 F (36.5 C)  97.7 F (36.5 C) 98.6 F (37 C)  TempSrc: Oral   Oral  SpO2: 96%  96% 97%  Weight:      Height:        Intake/Output Summary (Last 24 hours) at 01/04/2020 1023 Last data filed at 01/04/2020 0856 Gross per 24 hour  Intake 1548.5 ml  Output 4850 ml  Net -3301.5 ml   Filed Weights   01/01/20 1718  Weight: 90.7 kg     Data Reviewed:   CBC: Recent Labs  Lab 01/01/20 2124 01/02/20 0549 01/03/20 0601 01/04/20 0510  WBC 11.2* 9.6 8.3 8.0  NEUTROABS 7.3 4.7  --   --   HGB 12.0 11.4* 11.5* 13.8  HCT 35.8* 34.1* 35.8* 44.2  MCV 93.0 94.2 96.0 97.6  PLT 268 234 220 248   Basic Metabolic Panel: Recent Labs  Lab 01/01/20 1830 01/02/20 0018 01/02/20 0549 01/03/20 0601 01/04/20 0510  NA 140  --  143 139 138  K 3.1*  --  3.8 2.9* 4.7  CL 103  --  107 104 105  CO2 25  --  27 27 25   GLUCOSE 148*  --  114* 141* 132*  BUN 13  --  10 11 11   CREATININE 0.80  --  0.67 0.70 0.71  CALCIUM 9.1  --  8.6* 8.6* 9.1  MG  --  1.8 1.9 1.8 2.3  PHOS  --   --  3.0  --   --    GFR: Estimated Creatinine Clearance: 65.6 mL/min (by C-G formula based on SCr of 0.71 mg/dL). Liver Function Tests: Recent Labs  Lab 01/01/20 1830 01/02/20 0549  AST 28 20  ALT 22 20  ALKPHOS 55 45  BILITOT 1.4* 0.9  PROT 6.9 5.8*  ALBUMIN 3.8 3.1*   No results for input(s): LIPASE, AMYLASE in the last 168 hours. No results for  input(s): AMMONIA in the last 168 hours. Coagulation Profile: Recent Labs  Lab 01/01/20 1830 01/02/20 0549 01/03/20 0601 01/04/20 0510  INR 3.4* 4.1* 3.8* 3.0*   Cardiac Enzymes: No results for input(s): CKTOTAL, CKMB, CKMBINDEX, TROPONINI in the last 168 hours. BNP (last 3 results) No results for input(s): PROBNP in the last 8760 hours. HbA1C: Recent Labs    01/01/20 2124  HGBA1C 6.1*   CBG: Recent Labs  Lab 01/03/20 1610 01/03/20 1959 01/03/20 2344 01/04/20 0401 01/04/20 0735  GLUCAP 130* 139* 128* 123* 134*   Lipid Profile: No results for input(s): CHOL, HDL, LDLCALC, TRIG, CHOLHDL, LDLDIRECT in the last 72 hours. Thyroid Function Tests: Recent Labs    01/02/20 0549  TSH 2.402   Anemia Panel: No results for input(s): VITAMINB12, FOLATE, FERRITIN, TIBC, IRON, RETICCTPCT in the last 72 hours. Sepsis Labs: Recent Labs  Lab 01/01/20 1830 01/01/20 2004 01/02/20 0018 01/02/20 0234 01/02/20 0549  PROCALCITON  --   --  <0.10  --   --   LATICACIDVEN 2.9* 3.4*  --  2.0* 1.2    Recent Results (from the past 240 hour(s))  SARS Coronavirus 2 by RT PCR (hospital order, performed in [facility] hospital lab) Nasopharyngeal Nasopharyngeal Swab     Status: None   Collection Time: 01/01/20  5:33 PM   Specimen: Nasopharyngeal Swab  Result Value Ref Range Status   SARS Coronavirus 2 NEGATIVE NEGATIVE Final    Comment: (NOTE) SARS-CoV-2 target nucleic acids are NOT DETECTED.  The SARS-CoV-2 RNA is generally detectable in upper and lower respiratory specimens during the acute phase of infection. The lowest concentration of SARS-CoV-2 viral copies this assay can detect is 250 copies / mL. A negative result does not preclude SARS-CoV-2 infection and should not be used as the sole basis for treatment or other patient management decisions.  A negative result may occur with improper specimen collection / handling, submission of specimen other than nasopharyngeal swab,  presence of viral mutation(s) within the areas targeted by this assay, and inadequate number of viral copies (<250 copies / mL). A negative result must be combined with clinical observations, patient history, and epidemiological information.  Fact Sheet for Patients:   BoilerBrush.com.cy  Fact Sheet for Healthcare Providers: https://pope.com/  This test is not yet approved or  cleared by the [place] FDA and has been authorized for detection and/or diagnosis of SARS-CoV-2 by FDA under an Emergency Use Authorization (EUA).  This EUA will remain in effect (meaning this test can be used) for the duration of the COVID-19 declaration under Section 564(b)(1) of the Act, 21 U.S.C. section 360bbb-3(b)(1), unless the authorization is terminated or revoked sooner.  Performed at [facility], [address], [place], [place] [address]   Culture, blood (routine x 2)     Status: None (Preliminary result)   Collection Time: 01/01/20  6:30 PM   Specimen: BLOOD  Result Value Ref Range Status   Specimen Description   Final    BLOOD LEFT HAND Performed at [facility], [address], [place], [place] [address]    Special Requests   Final    BOTTLES DRAWN AEROBIC AND ANAEROBIC Blood Culture adequate volume Performed at [facility], [address] [place], [place] [address]    Culture   Final    NO GROWTH 3 DAYS Performed at [facility], [address], [place], [place] [address]    Report Status PENDING  Incomplete  Culture, blood (routine x 2)     Status: None (Preliminary result)   Collection Time: 01/01/20  6:30 PM   Specimen: BLOOD  Result Value Ref Range Status   Specimen Description   Final    BLOOD RIGHT HAND Performed at [facility], [address] [place], [place] [address]    Special Requests   Final    BOTTLES DRAWN AEROBIC AND ANAEROBIC  Blood Culture adequate volume Performed at [facility], [address] [place], [place] [address]    Culture   Final    NO GROWTH 3 DAYS Performed at [facility], [address] [place], [place] [address]    Report Status PENDING  Incomplete  Urine culture     Status: Abnormal   Collection Time: 01/01/20  9:24 PM   Specimen: Urine, Clean Catch  Result Value Ref Range Status   Specimen Description   Final    URINE, CLEAN CATCH Performed at [facility], [address] [place], [place] [address]    Special Requests   Final    NONE Performed at [facility], [address] [place], [place] [address]    Culture MULTIPLE SPECIES PRESENT, SUGGEST RECOLLECTION (A)  Final   Report Status 01/03/2020 FINAL  Final         Radiology Studies: No results found.      Scheduled Meds: . carbidopa-levodopa  1 tablet Oral Daily   And  . entacapone  200 mg Oral Daily  . carbidopa-levodopa  2 tablet Oral 2 times per day   And  . entacapone  400 mg Oral 2 times per day  . docusate sodium  100 mg Oral BID  . FLUoxetine  20 mg Oral Daily  . insulin aspart  0-9 Units Subcutaneous Q4H  . rosuvastatin  20 mg Oral QHS  . sodium chloride flush  3 mL Intravenous Q12H  . Warfarin - Pharmacist Dosing Inpatient   Does not apply q1600   Continuous Infusions: . sodium chloride 75 mL/hr at 01/04/20 0600  . cefTRIAXone (ROCEPHIN)  IV 1 g (01/03/20 2152)     LOS: 3 days   Time spent= 35 mins    Dayyan Krist Joline Maxcyhirag Patricia Fargo, MD [facility]  If 7PM-7AM, please contact night-coverage  01/04/2020, 10:23 AM

## 2020-01-04 NOTE — NC FL2 (Signed)
[place] MEDICAID FL2 LEVEL OF CARE SCREENING TOOL     IDENTIFICATION  Patient Name: Christy Pham Birthdate: February 27, 1946 Sex: female Admission Date (Current Location): 01/01/2020  [facility] and [place] Number:  Producer, television/film/video and Address:  [facility],  [address], [place] [address]      Provider Number: [id number]  Attending Physician Name and Address:  Dimple Nanas, MD  Relative Name and Phone Number:       Current Level of Care: Hospital Recommended Level of Care: Skilled Nursing Facility Prior Approval Number:    Date Approved/Denied:   PASRR Number: [id number]  Discharge Plan: SNF    Current Diagnoses: Patient Active Problem List   Diagnosis Date Noted  . Hypokalemia 01/02/2020  . Acute metabolic encephalopathy 01/02/2020  . Hallucinations 01/02/2020  . Sepsis (HCC) 01/01/2020  . Mitral regurgitation   . Lower urinary tract infectious disease 05/17/2014  . Atrial fibrillation with RVR (HCC) 05/17/2014  . Hypertension   . Hyperlipidemia   . Dehydration   . Essential hypertension   . 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 Depression 11/05/2012  . Parkinsonism 09/22/2012  . Depressive disorder, not elsewhere classified 09/22/2012  . Abnormality of gait 09/22/2012  . OBESITY, UNSPECIFIED 01/25/2010  . ATRIAL FIBRILLATION WITH RAPID VENTRICULAR RESPONSE 01/25/2010  . Chronic systolic heart failure (HCC) 01/25/2010    Orientation RESPIRATION BLADDER Height & Weight     Self, Time, Situation, Place  Normal Incontinent Weight: 90.7 kg Height:  5' 3.5" (161.3 cm)  BEHAVIORAL SYMPTOMS/MOOD NEUROLOGICAL BOWEL NUTRITION STATUS      Continent Diet  AMBULATORY STATUS COMMUNICATION OF NEEDS Skin   Extensive Assist Verbally Normal                       Personal Care Assistance Level of Assistance  Bathing, Dressing, Total care Bathing Assistance: Maximum assistance   Dressing Assistance: Maximum assistance Total Care Assistance:  Maximum assistance   Functional Limitations Info             SPECIAL CARE FACTORS FREQUENCY  PT (By licensed PT), OT (By licensed OT)     PT Frequency: 5x weekly OT Frequency: 5x weekly            Contractures Contractures Info: Not present    Additional Factors Info  Code Status, Allergies Code Status Info: DNR Allergies Info: NKDA           Current Medications (01/04/2020):  This is the current hospital active medication list Current Facility-Administered Medications  Medication Dose Route Frequency Provider Last Rate Last Admin  . 0.9 %  sodium chloride infusion   Intravenous Continuous Amin, Ankit Chirag, MD 75 mL/hr at 01/04/20 0600 Rate Verify at 01/04/20 0600  . acetaminophen (TYLENOL) tablet 650 mg  650 mg Oral Q6H PRN Therisa Doyne, MD       Or  . acetaminophen (TYLENOL) suppository 650 mg  650 mg Rectal Q6H PRN Doutova, Anastassia, MD      . carbidopa-levodopa (SINEMET IR) 25-100 MG per tablet immediate release 1 tablet  1 tablet Oral Daily Otho Bellows, RPH   1 tablet at 01/03/20 1222   And  . entacapone (COMTAN) tablet 200 mg  200 mg Oral Daily Ch[place] Si, Terri L, RPH   200 mg at 01/03/20 1222  . carbidopa-levodopa (SINEMET IR) 25-100 MG per tablet immediate release 2 tablet  2 tablet Oral 2 times per day Otho Bellows, RPH   2 tablet  at 01/04/20 0851   And  . entacapone (COMTAN) tablet 400 mg  400 mg Oral 2 times per day Otho Bellows, RPH   400 mg at 01/04/20 0850  . cefTRIAXone (ROCEPHIN) 1 g in sodium chloride 0.9 % 100 mL IVPB  1 g Intravenous Q24H Doutova, Anastassia, MD 200 mL/hr at 01/03/20 2152 1 g at 01/03/20 2152  . docusate sodium (COLACE) capsule 100 mg  100 mg Oral BID Therisa Doyne, MD   100 mg at 01/04/20 0851  . FLUoxetine (PROZAC) capsule 20 mg  20 mg Oral Daily Doutova, Anastassia, MD   20 mg at 01/04/20 0850  . HYDROcodone-acetaminophen (NORCO/VICODIN) 5-325 MG per tablet 1-2 tablet  1-2 tablet Oral Q4H PRN Doutova, Anastassia,  MD      . insulin aspart (novoLOG) injection 0-9 Units  0-9 Units Subcutaneous Q4H Therisa Doyne, MD   1 Units at 01/04/20 0850  . metoprolol tartrate (LOPRESSOR) injection 5 mg  5 mg Intravenous Q4H PRN Amin, Ankit Chirag, MD      . ondansetron (ZOFRAN) tablet 4 mg  4 mg Oral Q6H PRN Doutova, Anastassia, MD       Or  . ondansetron (ZOFRAN) injection 4 mg  4 mg Intravenous Q6H PRN Doutova, Anastassia, MD      . polyethylene glycol (MIRALAX / GLYCOLAX) packet 17 g  17 g Oral Daily PRN Amin, Ankit Chirag, MD      . rosuvastatin (CRESTOR) tablet 20 mg  20 mg Oral QHS Therisa Doyne, MD   20 mg at 01/03/20 2108  . senna-docusate (Senokot-S) tablet 2 tablet  2 tablet Oral QHS PRN Amin, Ankit Chirag, MD      . sodium chloride flush (NS) 0.9 % injection 3 mL  3 mL Intravenous Q12H Doutova, Anastassia, MD   3 mL at 01/04/20 0936  . Warfarin - Pharmacist Dosing Inpatient   Does not apply q1600 Donell Beers, [facility]         Discharge Medications: Please see discharge summary for a list of discharge medications.  Relevant Imaging Results:  Relevant Lab Results:   Additional Information SSN [id number]  Armanda Heritage, RN

## 2020-01-04 NOTE — Telephone Encounter (Signed)
30 day event monitor has been ordered.

## 2020-01-04 NOTE — Progress Notes (Signed)
ANTICOAGULATION CONSULT NOTE - Initial Consult  Pharmacy Consult for Warfarin Indication: atrial fibrillation  No Known Allergies  Patient Measurements: Height: 5' 3.5" (161.3 cm) Weight: 90.7 kg (200 lb) IBW/kg (Calculated) : 53.55  Vital Signs: Temp: 98.6 F (37 C) (08/09 0840) Temp Source: Oral (08/09 0840) BP: 95/60 (08/09 0840) Pulse Rate: 98 (08/09 0840)  Labs: Recent Labs    01/01/20 1830 01/02/20 0018 01/02/20 0234 01/02/20 0549 01/02/20 0549 01/03/20 0601 01/04/20 0510  HGB   < >  --   --  11.4*   < > 11.5* 13.8  HCT   < >  --   --  34.1*  --  35.8* 44.2  PLT   < >  --   --  234  --  220 248  LABPROT   < >  --   --  38.5*  --  36.5* 29.9*  INR   < >  --   --  4.1*  --  3.8* 3.0*  CREATININE   < >  --   --  0.67  --  0.70 0.71  TROPONINIHS  --  14 14  --   --   --   --    < > = values in this interval not displayed.    Estimated Creatinine Clearance: 65.6 mL/min (by C-G formula based on SCr of 0.71 mg/dL).   Medications:  PTA Warfarin 7.5mg  daily - last dose taken 8/6 @ 0830  Assessment: 74 yr female admitted with sepsis due to UTI and AFib with RVR on warfarin PTA. Pharmacy consulted to dose warfarin on admission.   Home dose: Warfarin 7.5 mg PO daily Last dose: 8/6  Today, 01/04/20  INR (3.0) at the high end of the target range after holding warfarin doses for the past two days  CBC: HgB and plt WNL  Diet: Carb modified; intake not charted  No major drug-drug interactions w/ warfarin noted; broad spectrum abx can increase INR   Goal of Therapy:  INR 2-3   Plan:   Resume warfarin at decreased dose of 2.5 mg po x1 today   Daily INR   CBC prn  Monitor for s/s of bleeding  Hector Brunswick, PharmD 01/04/2020,10:30 AM

## 2020-01-04 NOTE — Telephone Encounter (Signed)
-----   Message from Quintella Reichert, MD sent at 01/03/2020  8:12 AM EDT ----- Patient currently in hospital with urosepsis and has chronic afib but came in with afib with RVR.  Having pauses during sleep with HR in the 30's.  Please order a 30 day event monitor

## 2020-01-04 NOTE — Care Management Important Message (Signed)
Important Message  Patient Details IM Letter given to the Patient Name: Christy Pham MRN: [id number] Date of Birth: Nov 18, 1945   Medicare Important Message Given:  Yes     Caren Macadam 01/04/2020, 9:59 AM

## 2020-01-05 LAB — CBC
HCT: 39.2 % (ref 36.0–46.0)
Hemoglobin: 12.4 g/dL (ref 12.0–15.0)
MCH: 30.5 pg (ref 26.0–34.0)
MCHC: 31.6 g/dL (ref 30.0–36.0)
MCV: 96.6 fL (ref 80.0–100.0)
Platelets: 268 10*3/uL (ref 150–400)
RBC: 4.06 MIL/uL (ref 3.87–5.11)
RDW: 14.5 % (ref 11.5–15.5)
WBC: 7.7 10*3/uL (ref 4.0–10.5)
nRBC: 0 % (ref 0.0–0.2)

## 2020-01-05 LAB — PROTIME-INR
INR: 2.2 — ABNORMAL HIGH (ref 0.8–1.2)
Prothrombin Time: 23.3 seconds — ABNORMAL HIGH (ref 11.4–15.2)

## 2020-01-05 LAB — BASIC METABOLIC PANEL
Anion gap: 10 (ref 5–15)
BUN: 12 mg/dL (ref 8–23)
CO2: 26 mmol/L (ref 22–32)
Calcium: 9.1 mg/dL (ref 8.9–10.3)
Chloride: 104 mmol/L (ref 98–111)
Creatinine, Ser: 0.66 mg/dL (ref 0.44–1.00)
GFR calc Af Amer: >60 mL/min (ref >60)
GFR calc non Af Amer: >60 mL/min (ref >60)
Glucose, Bld: 132 mg/dL — ABNORMAL HIGH (ref 70–99)
Potassium: 3.5 mmol/L (ref 3.5–5.1)
Sodium: 140 mmol/L (ref 135–145)

## 2020-01-05 LAB — GLUCOSE, CAPILLARY
Glucose-Capillary: 106 mg/dL — ABNORMAL HIGH (ref 70–99)
Glucose-Capillary: 126 mg/dL — ABNORMAL HIGH (ref 70–99)
Glucose-Capillary: 130 mg/dL — ABNORMAL HIGH (ref 70–99)
Glucose-Capillary: 163 mg/dL — ABNORMAL HIGH (ref 70–99)

## 2020-01-05 LAB — SARS CORONAVIRUS 2 BY RT PCR (HOSPITAL ORDER, PERFORMED IN ~~LOC~~ HOSPITAL LAB): SARS Coronavirus 2: NEGATIVE

## 2020-01-05 LAB — MAGNESIUM: Magnesium: 2.3 mg/dL (ref 1.7–2.4)

## 2020-01-05 MED ORDER — POTASSIUM CHLORIDE CRYS ER 20 MEQ PO TBCR
40.0000 meq | EXTENDED_RELEASE_TABLET | Freq: Once | ORAL | Status: AC
  Administered 2020-01-05: 40 meq via ORAL
  Filled 2020-01-05: qty 2

## 2020-01-05 NOTE — Progress Notes (Signed)
ANTICOAGULATION CONSULT NOTE - Initial Consult  Pharmacy Consult for Warfarin Indication: atrial fibrillation  No Known Allergies  Patient Measurements: Height: 5' 3.5" (161.3 cm) Weight: 90.7 kg (200 lb) IBW/kg (Calculated) : 53.55  Vital Signs: Temp: 97.5 F (36.4 C) (08/10 0433) Temp Source: Oral (08/10 0433) BP: 155/88 (08/10 0433) Pulse Rate: 89 (08/10 0433)  Labs: Recent Labs    01/03/20 0601 01/03/20 0601 01/04/20 0510 01/05/20 0515  HGB 11.5*   < > 13.8 12.4  HCT 35.8*  --  44.2 39.2  PLT 220  --  248 268  LABPROT 36.5*  --  29.9* 23.3*  INR 3.8*  --  3.0* 2.2*  CREATININE 0.70  --  0.71 0.66   < > = values in this interval not displayed.    Estimated Creatinine Clearance: 65.6 mL/min (by C-G formula based on SCr of 0.66 mg/dL).   Medications:  PTA Warfarin 7.5mg  daily - last dose taken 8/6 @ 0830  Assessment: 74 yr female admitted with sepsis due to UTI and AFib with RVR on warfarin PTA. Pharmacy consulted to dose warfarin on admission.   Home dose: Warfarin 7.5 mg PO daily Last dose: 8/6  Today, 01/05/20  INR (2.2) is at low end of target range of 2-3 after resuming warfarin at lower dose of 2.5 mg yesterday  CBC: HgB and plt WNL  Diet: Carb modified; intake not charted  No major drug-drug interactions w/ warfarin noted; broad spectrum abx can increase INR   Goal of Therapy:  INR 2-3   Plan:   Continue warfarin 7.5 mg po x1 today   For discharge recommend continuing home dose  Recommend rechecking INR in 3-5 days  Hector Brunswick, PharmD 01/05/2020,1:37 PM

## 2020-01-05 NOTE — TOC Progression Note (Signed)
Transition of Care [facility]) - Progression Note    Patient Details  Name: HARSHITA BERNALES MRN: [id number] Date of Birth: 1946/02/20  [facility]) CM/SW Contact  Armanda Heritage, RN Phone Number: 01/05/2020, 12:30 PM  Clinical Narrative:    CM spoke with patient's son who initially selects [facility], facility however has had a patient and employee test positive for Covid-19 overnight and now will not be able to accept patient due to no longer having an isolation bed available and patient not being vaccinated so facility is hesitant to accept until more residents are tested.  CM spoke with son about this new barrier and he now [employer].  CM spoke with facility rep and they are able to accept patient today.  DC summary and negative Covid test results faxed to facility.  Patient will discharge to room 207. PTAR transportation arranged.   Expected Discharge Plan: Skilled Nursing Facility Barriers to Discharge: No Barriers Identified  Expected Discharge Plan and Services Expected Discharge Plan: Skilled Nursing Facility   Discharge Planning Services: CM Consult Post Acute Care Choice: Skilled Nursing Facility Living arrangements for the past 2 months: Single Family Home Expected Discharge Date: 01/05/20               DME Arranged: N/A DME Agency: NA       HH Arranged: NA HH Agency: NA         Social Determinants of Health (SDOH) Interventions    Readmission Risk Interventions No flowsheet data found.

## 2020-01-05 NOTE — Progress Notes (Signed)
Report called to nurse at [facility], awaiting PTAR to transport. Will continue to monitor.

## 2020-01-05 NOTE — Discharge Summary (Signed)
Physician Discharge Summary  Christy Pham WUJ:[id number] DOB: 12/06/1945 DOA: 01/01/2020  PCP: Barbie BannerWilson, Fred H, MD  Admit date: 01/01/2020 Discharge date: 01/05/2020  Admitted From: Home Disposition: Home  Recommendations for Outpatient Follow-up:  1. Follow up with PCP in 1-2 weeks 2. Please obtain BMP/CBC in one week your next doctors visit.  3. Follow-up outpatient cardiology with Dr. Mayford Knifeurner to help arrange for ambulatory monitoring 4. Recommend outpatient sleep study which can be arranged by PCP 5. Oral Keflex for UTI, last day 01/07/2020 6. Benefits from CPAP at night   Discharge Condition: Stable CODE STATUS: DNR Diet recommendation: Heart healthy  Brief/Interim Summary: 74 year old with history of A. fib on Coumadin, DM2, HLD, Parkinson's, essential hypertension presents for worsening confusion at home, unsteady.  In the ER noted to have sepsis secondary to urinary tract infection with A. fib with RVR.  Started on empiric Rocephin and eventually transition to Keflex, CT head negative for acute pathology.  At night she has been bradycardic with brief pause on telemetry.  During the hospital she was placed on CPAP which helped there.  Seen by PT/OT recommended SNF.  Over the course of 2-3 days symptomatically started feeling much better and was transitioned to rehab. Patient is medically stable to be discharged.   Assessment & Plan:   Active Problems:   ATRIAL FIBRILLATION WITH RAPID VENTRICULAR RESPONSE   Chronic systolic heart failure (HCC)   Parkinsonism (HCC)   Lower urinary tract infectious disease   Dehydration   Essential hypertension   Sepsis (HCC)   Hypokalemia   Acute metabolic encephalopathy   Hallucinations  Sepsis secondary to urinary tract infection, POA Acute metabolic encephalopathy Mild to moderate dehydration -Sepsis physiology has resolved.  Blood cultures remain negative.    IV Rocephin transition to oral Keflex, last day 01/07/2020 -CT head-chronic  atrophy, no acute changes -Chest x-ray unremarkable.  Procalcitonin negative  Atrial fibrillation with RVR now with bradycardia and pauses -Bradycardia has slightly improved.  Suspect OSA versus sick sinus syndrome.  Spoke with cardiology, Dr. Mayford Knifeurner on 8/8 will arrange for outpatient follow-up including ambulatory monitoring. -Digoxin level slightly low, should resume metoprolol and digoxin upon discharge -Coumadin per pharmacy.  INR levels will need to be closely monitored outpatient. -Echocardiogram July 2020-EF 60%. -We will need outpatient sleep study, can be arranged by PCP  Hypokalemia -Aggressive repletion of electrolytes.  Chronic congestive heart failure with preserved ejection fraction, 60 to 65% -Appears more or less euvolemic. -Supportive care.  Essential hypertension -Continue metoprolol and digoxin  History of Parkinson's disease -Continue home Stalevo  Diabetes mellitus type 2 -Insulin sliding scale and Accu-Cheks -A1c 6.7.  TSH within normal limits. -Home Metformin on hold  Speech-mild aspiration risk, regular, nectar thick liquid diet PT-SNF   Body mass index is 34.87 kg/m.  Pressure Ulcer 08/14/11 (Active)  08/14/11 0030  Location: Leg  Location Orientation:   Staging:   Wound Description (Comments):   Present on Admission:      Pressure Ulcer 08/15/11 Stage II -  Partial thickness loss of dermis presenting as a shallow open ulcer with a red, pink wound bed without slough. red, scaly (Active)  08/15/11 0834  Location: Heel  Location Orientation: Right;Left  Staging: Stage II -  Partial thickness loss of dermis presenting as a shallow open ulcer with a red, pink wound bed without slough.  Wound Description (Comments): red, scaly  Present on Admission:         Discharge Diagnoses:  Active Problems:  ATRIAL FIBRILLATION WITH RAPID VENTRICULAR RESPONSE   Chronic systolic heart failure (HCC)   Parkinsonism (HCC)   Lower urinary tract  infectious disease   Dehydration   Essential hypertension   Sepsis (HCC)   Hypokalemia   Acute metabolic encephalopathy   Hallucinations     Subjective: Feels great, no new complaints.  Discharge Exam: Vitals:   01/04/20 2240 01/05/20 0433  BP:  (!) 155/88  Pulse: 71 89  Resp:  19  Temp:  (!) 97.5 F (36.4 C)  SpO2: 100% 99%   Vitals:   01/04/20 1249 01/04/20 1941 01/04/20 2240 01/05/20 0433  BP: 124/89 127/63  (!) 155/88  Pulse: 76 62 71 89  Resp: Temp: 98.2 F (36.8 C) 98.1 F (36.7 C)  (!) 97.5 F (36.4 C)  TempSrc: Oral Oral  Oral  SpO2: 97% 100% 100% 99%  Weight:      Height:        General: Pt is alert, awake, not in acute distress, elderly frail Cardiovascular: Irregularly irregular, S1/S2 +, no rubs, no gallops Respiratory: CTA bilaterally, no wheezing, no rhonchi Abdominal: Soft, NT, ND, bowel sounds + Extremities: no edema, no cyanosis  Discharge Instructions  Discharge Instructions    Ambulatory referral to Sleep Studies   Complete by: As directed      Allergies as of 01/05/2020   No Known Allergies     Medication List    TAKE these medications   carbidopa-levodopa-entacapone 31.25-125-200 MG tablet Commonly known as: STALEVO 2 tablets at 7 am, 1 at 11 am, 2 at 4 pm   cephALEXin 500 MG capsule Commonly known as: KEFLEX Take 1 capsule (500 mg total) by mouth 4 (four) times daily for 3 days.   digoxin 0.125 MG tablet Commonly known as: LANOXIN Take 1 tablet (0.125 mg total) by mouth daily. Please make overdue appt with Dr. Mayford Knife before anymore refills. 1st attempt   EYE VITAMINS PO Take 2 tablets by mouth daily.   FLUoxetine 20 MG capsule Commonly known as: PROZAC Take 20 mg by mouth daily.   metFORMIN 500 MG 24 hr tablet Commonly known as: GLUCOPHAGE-XR Take 500 mg by mouth daily.   metoprolol tartrate 25 MG tablet Commonly known as: LOPRESSOR Take 0.5 tablets (12.5 mg total) by mouth 2 (two) times daily.    multivitamin tablet Take 1 tablet by mouth daily.   rosuvastatin 20 MG tablet Commonly known as: CRESTOR Take 20 mg by mouth at bedtime.   warfarin 7.5 MG tablet Commonly known as: COUMADIN Take 7.5 mg by mouth daily.       Follow-up Information    Barbie Banner, MD. Schedule an appointment as soon as possible for a visit in 1 week(s).   Specialty: Family Medicine Contact information: [address] [place] [address] [phone]        Quintella Reichert, MD. Schedule an appointment as soon as possible for a visit in 1 week(s).   Specialty: Cardiology Contact information: [address] [place] [address] [phone]              No Known Allergies  You were cared for by a hospitalist during your hospital stay. If you have any questions about your discharge medications or the care you received while you were in the hospital after you are discharged, you can call the unit and asked to speak with the hospitalist on call if the hospitalist that took care of  you is not available. Once you are discharged, your primary care physician will handle any further medical issues. Please note that no refills for any discharge medications will be authorized once you are discharged, as it is imperative that you return to your primary care physician (or establish a relationship with a primary care physician if you do not have one) for your aftercare needs so that they can reassess your need for medications and monitor your lab values.   Procedures/Studies: CT Head Wo Contrast  Result Date: 01/01/2020 CLINICAL DATA:  Altered mental status. EXAM: CT HEAD WITHOUT CONTRAST TECHNIQUE: Contiguous axial images were obtained from the base of the skull through the vertex without intravenous contrast. COMPARISON:  None. FINDINGS: Brain: There is mild cerebral atrophy with widening of the extra-axial spaces and ventricular dilatation. There are areas of decreased attenuation  within the white matter tracts of the supratentorial brain, consistent with microvascular disease changes Vascular: No hyperdense vessel or unexpected calcification. Skull: Normal. Negative for fracture or focal lesion. Sinuses/Orbits: No acute finding. Other: None. IMPRESSION: 1. Generalized cerebral atrophy. 2. No acute intracranial abnormality. Electronically Signed   By: Aram Candela M.D.   On: 01/01/2020 18:16   DG Chest Portable 1 View  Result Date: 01/01/2020 CLINICAL DATA:  Weakness EXAM: PORTABLE CHEST 1 VIEW COMPARISON:  05/17/2014 FINDINGS: Mild cardiac enlargement unchanged. Normal vascularity. Negative for heart failure. Atherosclerotic calcification aortic arch. Lungs are clear without infiltrate or effusion. IMPRESSION: No active disease. Electronically Signed   By: Marlan Palau M.D.   On: 01/01/2020 18:02      The results of significant diagnostics from this hospitalization (including imaging, microbiology, ancillary and laboratory) are listed below for reference.     Microbiology: Recent Results (from the past 240 hour(s))  SARS Coronavirus 2 by RT PCR (hospital order, performed in [facility] hospital lab) Nasopharyngeal Nasopharyngeal Swab     Status: None   Collection Time: 01/01/20  5:33 PM   Specimen: Nasopharyngeal Swab  Result Value Ref Range Status   SARS Coronavirus 2 NEGATIVE NEGATIVE Final    Comment: (NOTE) SARS-CoV-2 target nucleic acids are NOT DETECTED.  The SARS-CoV-2 RNA is generally detectable in upper and lower respiratory specimens during the acute phase of infection. The lowest concentration of SARS-CoV-2 viral copies this assay can detect is 250 copies / mL. A negative result does not preclude SARS-CoV-2 infection and should not be used as the sole basis for treatment or other patient management decisions.  A negative result may occur with improper specimen collection / handling, submission of specimen other than nasopharyngeal swab, presence  of viral mutation(s) within the areas targeted by this assay, and inadequate number of viral copies (<250 copies / mL). A negative result must be combined with clinical observations, patient history, and epidemiological information.  Fact Sheet for Patients:   BoilerBrush.com.cy  Fact Sheet for Healthcare Providers: https://pope.com/  This test is not yet approved or  cleared by the [place] FDA and has been authorized for detection and/or diagnosis of SARS-CoV-2 by FDA under an Emergency Use Authorization (EUA).  This EUA will remain in effect (meaning this test can be used) for the duration of the COVID-19 declaration under Section 564(b)(1) of the Act, 21 U.S.C. section 360bbb-3(b)(1), unless the authorization is terminated or revoked sooner.  Performed at [facility], [address], [place], [place] [address]   Culture, blood (routine x 2)     Status: None (Preliminary result)   Collection Time: 01/01/20  6:30 PM   Specimen: BLOOD  Result Value Ref Range Status   Specimen Description   Final    BLOOD LEFT HAND Performed at [facility], [address], [place], [place] [address]    Special Requests   Final    BOTTLES DRAWN AEROBIC AND ANAEROBIC Blood Culture adequate volume Performed at [facility], [address] [place], [place] [address]    Culture   Final    NO GROWTH 3 DAYS Performed at [facility], [address], [place], [place] [address]    Report Status PENDING  Incomplete  Culture, blood (routine x 2)     Status: None (Preliminary result)   Collection Time: 01/01/20  6:30 PM   Specimen: BLOOD  Result Value Ref Range Status   Specimen Description   Final    BLOOD RIGHT HAND Performed at [facility], [address], [place], [place] [address]    Special Requests   Final    BOTTLES DRAWN AEROBIC AND ANAEROBIC Blood  Culture adequate volume Performed at [facility], [address] [place], [place] [address]    Culture   Final    NO GROWTH 3 DAYS Performed at [facility], [address] [place], [place] [address]    Report Status PENDING  Incomplete  Urine culture     Status: Abnormal   Collection Time: 01/01/20  9:24 PM   Specimen: Urine, Clean Catch  Result Value Ref Range Status   Specimen Description   Final    URINE, CLEAN CATCH Performed at [facility], [address] [place], [place] [address]    Special Requests   Final    NONE Performed at [facility], [address] [place], [place] [address]    Culture MULTIPLE SPECIES PRESENT, SUGGEST RECOLLECTION (A)  Final   Report Status 01/03/2020 FINAL  Final     Labs: BNP (last 3 results) Recent Labs    01/02/20 0018  BNP 70.4   Basic Metabolic Panel: Recent Labs  Lab 01/01/20 1830 01/02/20 0018 01/02/20 0549 01/03/20 0601 01/04/20 0510 01/05/20 0515  NA 140  --  143 139 138 140  K 3.1*  --  3.8 2.9* 4.7 3.5  CL 103  --  107 104 105 104  CO2 25  --  27 27 25 26   GLUCOSE 148*  --  114* 141* 132* 132*  BUN 13  --  10 11 11 12   CREATININE 0.80  --  0.67 0.70 0.71 0.66  CALCIUM 9.1  --  8.6* 8.6* 9.1 9.1  MG  --  1.8 1.9 1.8 2.3 2.3  PHOS  --   --  3.0  --   --   --    Liver Function Tests: Recent Labs  Lab 01/01/20 1830 01/02/20 0549  AST 28 20  ALT 22 20  ALKPHOS 55 45  BILITOT 1.4* 0.9  PROT 6.9 5.8*  ALBUMIN 3.8 3.1*   No results for input(s): LIPASE, AMYLASE in the last 168 hours. No results for input(s): AMMONIA in the last 168 hours. CBC: Recent Labs  Lab 01/01/20 2124 01/02/20 0549 01/03/20 0601 01/04/20 0510 01/05/20 0515  WBC 11.2* 9.6 8.3 8.0 7.7  NEUTROABS 7.3 4.7  --   --   --   HGB 12.0 11.4* 11.5* 13.8 12.4  HCT 35.8* 34.1* 35.8* 44.2 39.2  MCV 93.0 94.2 96.0 97.6 96.6  PLT 268  234 220 248 268   Cardiac Enzymes: No  results for input(s): CKTOTAL, CKMB, CKMBINDEX, TROPONINI in the last 168 hours. BNP: Invalid input(s): POCBNP CBG: Recent Labs  Lab 01/04/20 1623 01/04/20 1937 01/05/20 0041 01/05/20 0418 01/05/20 0749  GLUCAP 116* 146* 106* 126* 130*   D-Dimer No results for input(s): DDIMER in the last 72 hours. Hgb A1c No results for input(s): HGBA1C in the last 72 hours. Lipid Profile No results for input(s): CHOL, HDL, LDLCALC, TRIG, CHOLHDL, LDLDIRECT in the last 72 hours. Thyroid function studies No results for input(s): TSH, T4TOTAL, T3FREE, THYROIDAB in the last 72 hours.  Invalid input(s): FREET3 Anemia work up No results for input(s): VITAMINB12, FOLATE, FERRITIN, TIBC, IRON, RETICCTPCT in the last 72 hours. Urinalysis    Component Value Date/Time   COLORURINE AMBER (A) 01/01/2020 2124   APPEARANCEUR HAZY (A) 01/01/2020 2124   LABSPEC 1.019 01/01/2020 2124   PHURINE 5.0 01/01/2020 2124   GLUCOSEU NEGATIVE 01/01/2020 2124   HGBUR MODERATE (A) 01/01/2020 2124   BILIRUBINUR NEGATIVE 01/01/2020 2124   KETONESUR 5 (A) 01/01/2020 2124   PROTEINUR 30 (A) 01/01/2020 2124   UROBILINOGEN 0.2 05/17/2014 1221   NITRITE POSITIVE (A) 01/01/2020 2124   LEUKOCYTESUR TRACE (A) 01/01/2020 2124   Sepsis Labs Invalid input(s): PROCALCITONIN,  WBC,  LACTICIDVEN Microbiology Recent Results (from the past 240 hour(s))  SARS Coronavirus 2 by RT PCR (hospital order, performed in [facility] hospital lab) Nasopharyngeal Nasopharyngeal Swab     Status: None   Collection Time: 01/01/20  5:33 PM   Specimen: Nasopharyngeal Swab  Result Value Ref Range Status   SARS Coronavirus 2 NEGATIVE NEGATIVE Final    Comment: (NOTE) SARS-CoV-2 target nucleic acids are NOT DETECTED.  The SARS-CoV-2 RNA is generally detectable in upper and lower respiratory specimens during the acute phase of infection. The lowest concentration of SARS-CoV-2 viral copies this assay can detect is 250 copies / mL. A negative  result does not preclude SARS-CoV-2 infection and should not be used as the sole basis for treatment or other patient management decisions.  A negative result may occur with improper specimen collection / handling, submission of specimen other than nasopharyngeal swab, presence of viral mutation(s) within the areas targeted by this assay, and inadequate number of viral copies (<250 copies / mL). A negative result must be combined with clinical observations, patient history, and epidemiological information.  Fact Sheet for Patients:   BoilerBrush.com.cy  Fact Sheet for Healthcare Providers: https://pope.com/  This test is not yet approved or  cleared by the [place] FDA and has been authorized for detection and/or diagnosis of SARS-CoV-2 by FDA under an Emergency Use Authorization (EUA).  This EUA will remain in effect (meaning this test can be used) for the duration of the COVID-19 declaration under Section 564(b)(1) of the Act, 21 U.S.C. section 360bbb-3(b)(1), unless the authorization is terminated or revoked sooner.  Performed at [facility], [address] [place], [place] [address]   Culture, blood (routine x 2)     Status: None (Preliminary result)   Collection Time: 01/01/20  6:30 PM   Specimen: BLOOD  Result Value Ref Range Status   Specimen Description   Final    BLOOD LEFT HAND Performed at [facility], [address] [place], [place] [address]    Special Requests   Final    BOTTLES DRAWN AEROBIC AND ANAEROBIC Blood Culture adequate volume Performed at [facility], [address] [place],  [place] [address]    Culture   Final    NO GROWTH 3 DAYS Performed at [facility], [address] [place], [place] [address]    Report Status PENDING  Incomplete  Culture, blood (routine x 2)     Status: None (Preliminary result)   Collection Time: 01/01/20   6:30 PM   Specimen: BLOOD  Result Value Ref Range Status   Specimen Description   Final    BLOOD RIGHT HAND Performed at [facility], [address], [place], [place] [address]    Special Requests   Final    BOTTLES DRAWN AEROBIC AND ANAEROBIC Blood Culture adequate volume Performed at [facility], [address], [place], [place] [address]    Culture   Final    NO GROWTH 3 DAYS Performed at [facility], [address] [place], [place] [address]    Report Status PENDING  Incomplete  Urine culture     Status: Abnormal   Collection Time: 01/01/20  9:24 PM   Specimen: Urine, Clean Catch  Result Value Ref Range Status   Specimen Description   Final    URINE, CLEAN CATCH Performed at [facility], [address] [place], [place] [address]    Special Requests   Final    NONE Performed at [facility], [address], [place], [place] [address]    Culture MULTIPLE SPECIES PRESENT, SUGGEST RECOLLECTION (A)  Final   Report Status 01/03/2020 FINAL  Final     Time coordinating discharge:  I have spent 35 minutes face to face with the patient and on the ward discussing the patients care, assessment, plan and disposition with other care givers. >50% of the time was devoted counseling the patient about the risks and benefits of treatment/Discharge disposition and coordinating care.   SIGNED:   Dimple Nanas, MD  [facility] 01/05/2020, 9:59 AM   If 7PM-7AM, please contact night-coverage

## 2020-01-06 LAB — CULTURE, BLOOD (ROUTINE X 2)
Culture: NO GROWTH
Culture: NO GROWTH
Special Requests: ADEQUATE
Special Requests: ADEQUATE

## 2020-01-13 ENCOUNTER — Encounter: Payer: Self-pay | Admitting: *Deleted

## 2020-01-13 NOTE — Progress Notes (Signed)
Patient ID: Christy Pham, female   DOB: 05/21/1946, 74 y.o.   MRN: [id number] Patient enrolled for [employer] to ship a 30 day cardiac event monitor to: [facility] [address] [place], [place] [address] Attn: Candee Furbish Letter with instructions mailed to same address.

## 2020-01-20 ENCOUNTER — Other Ambulatory Visit: Payer: Self-pay | Admitting: Cardiology

## 2020-01-21 ENCOUNTER — Other Ambulatory Visit: Payer: Self-pay

## 2020-01-21 ENCOUNTER — Telehealth: Payer: Medicare Other | Admitting: Cardiology

## 2020-01-25 ENCOUNTER — Other Ambulatory Visit (HOSPITAL_COMMUNITY): Payer: Self-pay | Admitting: *Deleted

## 2020-01-25 DIAGNOSIS — R1310 Dysphagia, unspecified: Secondary | ICD-10-CM

## 2020-01-25 DIAGNOSIS — R059 Cough, unspecified: Secondary | ICD-10-CM

## 2020-02-05 ENCOUNTER — Other Ambulatory Visit: Payer: Self-pay

## 2020-02-05 ENCOUNTER — Ambulatory Visit (HOSPITAL_COMMUNITY)
Admission: RE | Admit: 2020-02-05 | Discharge: 2020-02-05 | Disposition: A | Discharge status: Home / Self Care | Payer: No Typology Code available for payment source | Source: Ambulatory Visit | Attending: Family Medicine | Admitting: Family Medicine

## 2020-02-05 DIAGNOSIS — R1310 Dysphagia, unspecified: Secondary | ICD-10-CM | POA: Insufficient documentation

## 2020-02-05 DIAGNOSIS — R059 Cough, unspecified: Secondary | ICD-10-CM

## 2020-02-05 DIAGNOSIS — G20 Parkinson's disease: Secondary | ICD-10-CM | POA: Insufficient documentation

## 2020-02-05 DIAGNOSIS — R05 Cough: Secondary | ICD-10-CM | POA: Insufficient documentation

## 2020-02-05 DIAGNOSIS — I4891 Unspecified atrial fibrillation: Secondary | ICD-10-CM | POA: Insufficient documentation

## 2020-02-05 NOTE — Progress Notes (Signed)
Modified Barium Swallow Progress Note  Patient Details  Name: Christy Pham MRN: [id number] Date of Birth: 1946/04/08  Today's Date: 02/05/2020  Modified Barium Swallow completed.  Full report located under Chart Review in the Imaging Section.  Brief recommendations include the following:  Clinical Impression   Pt has a functional oropharyngeal swallow across consistencies tested. Her oral preparation and transit is timely, and swallow is triggered primarily at the valleculae regardless of consistency. Although she has oral secretions pooling at baseline, spilling from her mouth and into her mask, there is no difficulty with oral containment and no truly measurable amount of residue with PO trials. Airway protection is good without any penetration or aspiration. Recommend up to regular solids and thin liquids.    Swallow Evaluation Recommendations       SLP Diet Recommendations: Regular solids;Thin liquid   Liquid Administration via: Cup;Straw   Medication Administration: Whole meds with liquid   Supervision: Staff to assist with self feeding       Postural Changes: Seated upright at 90 degrees   Oral Care Recommendations: Oral care BID        Mahala Menghini., M.A. CCC-SLP Acute Rehabilitation Services Pager [phone] Office [phone]  02/05/2020,2:02 PM

## 2020-02-17 ENCOUNTER — Encounter: Payer: Self-pay | Admitting: Cardiology

## 2020-02-17 ENCOUNTER — Telehealth (INDEPENDENT_AMBULATORY_CARE_PROVIDER_SITE_OTHER): Payer: Medicare Other | Admitting: Cardiology

## 2020-02-17 VITALS — Ht 63.5 in | Wt 185.0 lb

## 2020-02-17 DIAGNOSIS — I10 Essential (primary) hypertension: Secondary | ICD-10-CM

## 2020-02-17 DIAGNOSIS — I495 Sick sinus syndrome: Secondary | ICD-10-CM | POA: Diagnosis not present

## 2020-02-17 DIAGNOSIS — I340 Nonrheumatic mitral (valve) insufficiency: Secondary | ICD-10-CM | POA: Diagnosis not present

## 2020-02-17 DIAGNOSIS — I482 Chronic atrial fibrillation, unspecified: Secondary | ICD-10-CM | POA: Diagnosis not present

## 2020-02-17 NOTE — Addendum Note (Signed)
Addended by: Theresia Majors on: 02/17/2020 11:16 AM   Modules accepted: Orders

## 2020-02-17 NOTE — Patient Instructions (Signed)
Medication Instructions:  Your physician recommends that you continue on your current medications as directed. Please refer to the Current Medication list given to you today.  *If you need a refill on your cardiac medications before your next appointment, please call your pharmacy*  Testing/Procedures: Your physician has recommended that you have a sleep study. This test records several body functions during sleep, including: brain activity, eye movement, oxygen and carbon dioxide blood levels, heart rate and rhythm, breathing rate and rhythm, the flow of air through your mouth and nose, snoring, body muscle movements, and chest and belly movement.  Follow-Up: At [facility], you and your health needs are our priority.  As part of our continuing mission to provide you with exceptional heart care, we have created designated Provider Care Teams.  These Care Teams include your primary Cardiologist (physician) and Advanced Practice Providers (APPs -  Physician Assistants and Nurse Practitioners) who all work together to provide you with the care you need, when you need it.  We recommend signing up for the patient portal called "MyChart".  Sign up information is provided on this After Visit Summary.  MyChart is used to connect with patients for Virtual Visits (Telemedicine).  Patients are able to view lab/test results, encounter notes, upcoming appointments, etc.  Non-urgent messages can be sent to your provider as well.   To learn more about what you can do with MyChart, go to [email or web address].    Your next appointment:   6 month(s)  The format for your next appointment:   In Person  Provider:   Armanda Magic, MD

## 2020-02-17 NOTE — Progress Notes (Signed)
Virtual Visit Telephone Note   This visit type was conducted due to national recommendations for restrictions regarding the COVID-19 Pandemic (e.g. social distancing) in an effort to limit this patient's exposure and mitigate transmission in our community.  Due to her co-morbid illnesses, this patient is at least at moderate risk for complications without adequate follow up.  This format is felt to be most appropriate for this patient at this time.  The patient did not have access to video technology/had technical difficulties with video requiring transitioning to audio format only (telephone).  All issues noted in this document were discussed and addressed.  No physical exam could be performed with this format.  Please refer to the patient's chart for her  consent to telehealth for [facility].   Evaluation Performed:  Follow-up visit  This visit type was conducted due to national recommendations for restrictions regarding the COVID-19 Pandemic (e.g. social distancing).  This format is felt to be most appropriate for this patient at this time.  All issues noted in this document were discussed and addressed.  No physical exam was performed (except for noted visual exam findings with Video Visits).  Please refer to the patient's chart (MyChart message for video visits and phone note for telephone visits) for the patient's consent to telehealth for [facility].  Date:  02/17/2020   ID:  ILIANA Pham, DOB November 26, 1945, MRN [id number]  Patient Location:  Home  Provider location:   [place]  PCP:  Barbie Banner, MD  Cardiologist:  Armanda Magic, MD Electrophysiologist:  None   Chief Complaint:  Atrial fibrillation  History of Present Illness:    Christy Pham is a 74 y.o. female who presents via audio/video conferencing for a telehealth visit today.    This is a 74yo female with a history of HTN, hyperlipidemia and atrial fibrillation.  She has had atrial fibrillation for years and  remotely was followed by Dr. Patty Sermons.  She also has Parkinson's disease for over 25 years.  She is wheelchair bound due to this.  Her last echo was in 11/2018 showed normal LVF with EF 60-65% with severe LAE.  She is on chronic anticoagulation with coumadin with CHADS2VASC score of 3.    She was recently hospitalized last month for confusion and dx with urosepsis that was complicated by afib with RVR but then had bradycardia on tele during sleep.  She was placed on CPAP at night which help with nocturnal bradycardia.  An outpt event monitor was ordered to assess for bradyarrhythmias and she was discharged home on her home dose of dig and Lopressor.  She is currently wearing her event monitor.   She is here today for followup and is doing well.  She denies any chest pain or pressure,  PND, orthopnea, dizziness, palpitations or syncope since her hospitalization. She is compliant with her meds and is tolerating meds with no SE.  She has chronic LE edema and DOE which are stable.    The patient does not have symptoms concerning for COVID-19 infection (fever, chills, cough, or new shortness of breath).    Prior CV studies:   The following studies were reviewed today:  2D echo 11/2018 IMPRESSIONS    1. The left ventricle has normal systolic function with an ejection  fraction of 60-65%. The cavity size was normal. There is mildly increased  left ventricular wall thickness. Left ventricular diastolic function could  not be evaluated secondary to atrial  fibrillation.  2. The right  ventricle has normal systolic function. The cavity was  normal.  3. Left atrial size was severely dilated.  4. The mitral valve is abnormal. Mild thickening of the mitral valve  leaflet. There is mild mitral annular calcification present.  5. The aortic valve is tricuspid. Mild calcification of the aortic valve.  No stenosis of the aortic valve.  6. Normal LV function; mild LVH; severe LAE.   Past Medical  History:  Diagnosis Date   Chronic atrial fibrillation (HCC)    Depression    Diabetes (HCC)    Hyperlipidemia    dx circa 2008   Hypertension    dx circa 2008   Mitral regurgitation    mild to moderate by echo 2013   Osteoarthritis    "L knee primarily" (05/17/2014)   Parkinson's disease    dx circa 1990 by Dr. Thad Ranger   Past Surgical History:  Procedure Laterality Date   COLONOSCOPY     2006 by Jeani Hawking (clear per pt)   TONSILLECTOMY  ~ 1950   TUBAL LIGATION  ~ 1983     Current Meds  Medication Sig   carbidopa-levodopa-entacapone (STALEVO) 31.25-125-200 MG tablet 2 tablets at 7 am, 1 at 11 am, 2 at 4 pm   digoxin (LANOXIN) 0.125 MG tablet TAKE ONE TABLET BY MOUTH DAILY. PLEASE MAKE OVERDUE APPOINTMENT WITH DR. Mayford Knife BEFORE ANYMORE REFILLS   FLUoxetine (PROZAC) 20 MG capsule Take 20 mg by mouth daily.   metFORMIN (GLUCOPHAGE-XR) 500 MG 24 hr tablet Take 500 mg by mouth daily.    metoprolol tartrate (LOPRESSOR) 25 MG tablet Take 0.5 tablets (12.5 mg total) by mouth 2 (two) times daily.   Multiple Vitamin (MULTIVITAMIN) tablet Take 1 tablet by mouth daily.   Multiple Vitamins-Minerals (EYE VITAMINS PO) Take 2 tablets by mouth daily.    rosuvastatin (CRESTOR) 20 MG tablet Take 20 mg by mouth at bedtime.   warfarin (COUMADIN) 7.5 MG tablet Take 7.5 mg by mouth daily.     Allergies:   Patient has no known allergies.   Social History   Tobacco Use   Smoking status: Never Smoker   Smokeless tobacco: Never Used  Substance Use Topics   Alcohol use: Yes    Alcohol/week: 3.0 standard drinks    Types: 3 Glasses of wine per week    Comment: 3 times per week (wine)   Drug use: No     Family Hx: The patient's family history includes Colon cancer (age of onset: 29) in her father; Coronary artery disease (age of onset: 63) in her father; Dementia in her mother; Restless legs syndrome in her father.  ROS:   Please see the history of present  illness.     All other systems reviewed and are negative.   Labs/Other Tests and Data Reviewed:    Recent Labs: 01/02/2020: ALT 20; B Natriuretic Peptide 70.4; TSH 2.402 01/05/2020: BUN 12; Creatinine, Ser 0.66; Hemoglobin 12.4; Magnesium 2.3; Platelets 268; Potassium 3.5; Sodium 140   Recent Lipid Panel Lab Results  Component Value Date/Time   CHOL  12/03/2009 05:15 AM    156        ATP III CLASSIFICATION:  <200     mg/dL   Desirable  884-166  mg/dL   Borderline High  >=063    mg/dL   High          TRIG 79 12/03/2009 05:15 AM   HDL 57 12/03/2009 05:15 AM   CHOLHDL 2.7 12/03/2009 05:15 AM   LDLCALC  12/03/2009 05:15 AM    83        Total Cholesterol/HDL:CHD Risk Coronary Heart Disease Risk Table                     Men   Women  1/2 Average Risk   3.4   3.3  Average Risk       5.0   4.4  2 X Average Risk   9.6   7.1  3 X Average Risk  23.4   11.0        Use the calculated Patient Ratio above and the CHD Risk Table to determine the patient's CHD Risk.        ATP III CLASSIFICATION (LDL):  <100     mg/dL   Optimal  983-382  mg/dL   Near or Above                    Optimal  130-159  mg/dL   Borderline  505-397  mg/dL   High  >673     mg/dL   Very High    Wt Readings from Last 3 Encounters:  02/17/20 185 lb (83.9 kg)  01/01/20 200 lb (90.7 kg)  09/03/18 190 lb (86.2 kg)     Objective:    Vital Signs:  Ht 5' 3.5" (1.613 m)    Wt 185 lb (83.9 kg)    BMI 32.26 kg/m     ASSESSMENT & PLAN:    1.  Chronic atrial fibrillation  -her HR is controlled but she had some afib with RVR in the hospital with her urosepsis -continue Lopressor 12.5mg  BID and digoxin 0.125mg  daily -INRs monitored through her PCP Dr. Andrey Campanile >> INR was 2.2 on 01/05/2020 in hospital -dig level 0.2 on 01/03/2020  2.  Hypertension  -continue Lopressor 12.5mg  BID  3.  Mild to moderate MR  - this was noted on echo in 2013 but repeat echo 11/2018 showed no MR.    4.  Tachybrady syndrome -recently  while hospitalized she had afib with RVR in setting of urosepsis and was also noted to have episodes of bradycardia but only during sleep -she was restarted on her Lopressor and dig and is now wearing an event monitor -event monitor was ordered and sent from [employer] to her rehab facility but she has not received -there was concern that she may have underlying OSA and was placed on CPAP while in the hospital -I will get a home sleep study while at the rehab center  COVID-19 Education: The signs and symptoms of COVID-19 were discussed with the patient and how to seek care for testing (follow up with PCP or arrange E-visit).  The importance of social distancing was discussed today.  Patient Risk:   After full review of this patient's clinical status, I feel that they are at least moderate risk at this time.  Time:   Today, I have spent 20 minutes on telemedicine discussing medical problems including mitral regurgitation, hypertension and chronic atrial fbrillation and reviewing labs and 2D echo from 11/2018   Medication Adjustments/Labs and Tests Ordered: Current medicines are reviewed at length with the patient today.  Concerns regarding medicines are outlined above.  Tests Ordered: No orders of the defined types were placed in this encounter.  Medication Changes: No orders of the defined types were placed in this encounter.   Disposition:  Follow up in 1 year(s)  Signed, Armanda Magic, MD  02/17/2020 11:06 AM  [facility]

## 2020-02-18 ENCOUNTER — Telehealth: Payer: Self-pay | Admitting: *Deleted

## 2020-02-18 NOTE — Telephone Encounter (Signed)
Quintella Reichert, MD  Reesa Chew, CMA Disregard order for mask and supplies - wrong patient

## 2020-02-18 NOTE — Telephone Encounter (Signed)
Shante at [place] has located the [employer] 30 day cardiac event monitor and letter with instructions in the patients room.  She will apply the monitor today.

## 2020-02-18 NOTE — Telephone Encounter (Signed)
-----   Message from Quintella Reichert, MD sent at 02/17/2020 10:58 AM EDT ----- Please order patient a new mask of choice and supplies and get a download in 4 weeks.  Followup with me in1 year

## 2020-02-18 NOTE — Telephone Encounter (Signed)
Christy Pham, CMA  Reesa Chew, CMA Ok to schedule HST. Patient has Medicare and does not need a PA.

## 2020-02-18 NOTE — Telephone Encounter (Signed)
Theresia Majors, RN  P Cv Div Sleep Studies; Reesa Chew, CMA Home sleep study has been ordered for OSA.  Patient is in a rehab facility. Please arrange with patient's husband for pick up to take to the patient.  Thanks!

## 2020-02-18 NOTE — Telephone Encounter (Signed)
Spoke with Candee Furbish regarding [employer] cardiac event monitor.  [employer] website and [employer] tracking states monitor delivered 01/19/2020 5:10PM received by Christy Pham (inside).   Candee Furbish has not seen package or letter with instructions mailed to her attention.  She will go up front and search for package and call back within the next hour.

## 2020-02-18 NOTE — Telephone Encounter (Signed)
Staff message sent to Christy Pham ok to schedule HST. Patient has Medicare and does not require a PA.

## 2020-02-19 ENCOUNTER — Encounter: Payer: Self-pay | Admitting: *Deleted

## 2020-02-19 ENCOUNTER — Ambulatory Visit (INDEPENDENT_AMBULATORY_CARE_PROVIDER_SITE_OTHER): Payer: Medicare Other

## 2020-02-19 ENCOUNTER — Encounter: Payer: Self-pay | Admitting: Cardiology

## 2020-02-19 DIAGNOSIS — I4819 Other persistent atrial fibrillation: Secondary | ICD-10-CM | POA: Diagnosis not present

## 2020-02-19 DIAGNOSIS — I482 Chronic atrial fibrillation, unspecified: Secondary | ICD-10-CM

## 2020-02-19 NOTE — Telephone Encounter (Signed)
This encounter was created in error - please disregard.

## 2020-02-19 NOTE — Telephone Encounter (Signed)
-----   Message from Gaynelle Cage, CMA sent at 02/18/2020  9:33 AM EDT ----- Ok to schedule HST. Patient has Medicare and does not need a PA. ----- Message ----- From: Theresia Majors, RN Sent: 02/17/2020  11:16 AM EDT To: Reesa Chew, CMA, Cv Div Sleep Studies  Home sleep study has been ordered for OSA.  Patient is in a rehab facility. Please arrange with patient's husband for pick up to take to the patient.  Thanks!

## 2020-02-19 NOTE — Telephone Encounter (Signed)
Patient is aware and agreeable to Home Sleep Study through [facility]. Patient is scheduled for 03/04/20 at 11 am to pick up home sleep kit and meet with Respiratory therapist at [facility]. Patient is aware that if this appointment date and time does not work for them they should contact Artis Delay directly at [phone]. Patient is aware that a sleep packet will be sent from [facility] in week.  Left detailed message on voicemail with date and time of HST and informed patient to call back to confirm or reschedule.

## 2020-02-22 ENCOUNTER — Telehealth: Payer: Self-pay

## 2020-02-22 NOTE — Telephone Encounter (Signed)
Monitor Report Received from [employer] stating that the patient had " Atrial Fibrillation Sustained" on 02/19/20 at 11:43 PM CST. Per fax patient was asymptomatic in her room sleeping at the time. Patient is already anticoagulated with coumadin. Reviewed with DOD. No changes. Fax placed in bin to be scanned.

## 2020-03-04 ENCOUNTER — Encounter (HOSPITAL_BASED_OUTPATIENT_CLINIC_OR_DEPARTMENT_OTHER): Payer: Medicare Other | Admitting: Cardiology

## 2020-03-08 ENCOUNTER — Telehealth: Payer: Self-pay | Admitting: Cardiology

## 2020-03-08 NOTE — Telephone Encounter (Signed)
Monitor strip received from [employer].  Pt had a 4.1 second pause on 10/5 at 6:23pm CST with sustained AF noted as well.  Called pt and was unable to leave message due to VM being full.  Will place monitor in bin in triage to address.

## 2020-03-09 NOTE — Telephone Encounter (Signed)
Spoke to husband and he advised that Christy Pham was in rehab at [facility].

## 2020-03-09 NOTE — Telephone Encounter (Signed)
Called [facility] and spoke to a Diplomatic Services operational officer who transferred me to speak with the patients nurse. After transfer no one answered.

## 2020-03-09 NOTE — Telephone Encounter (Signed)
Called [facility]. The phone rang for about a minute then a recording said call could not be completed at this time.

## 2020-03-16 ENCOUNTER — Telehealth: Payer: Self-pay

## 2020-03-16 DIAGNOSIS — I482 Chronic atrial fibrillation, unspecified: Secondary | ICD-10-CM

## 2020-03-16 DIAGNOSIS — I495 Sick sinus syndrome: Secondary | ICD-10-CM

## 2020-03-16 NOTE — Telephone Encounter (Signed)
Referral placed for EP.

## 2020-03-16 NOTE — Telephone Encounter (Signed)
-----   Message from Quintella Reichert, MD sent at 03/15/2020  9:36 PM EDT ----- Patient has evidence of tachybrady syndrome - please refer to EP as she is having 4.2 sec pauses in afib during awake hours and still some episodes of afib with RVR

## 2020-03-22 ENCOUNTER — Institutional Professional Consult (permissible substitution): Payer: Medicare Other | Admitting: Cardiology

## 2020-03-22 NOTE — Progress Notes (Deleted)
Electrophysiology Office Note   Date:  03/22/2020   ID:  Christy Pham, DOB [birth year], MRN [id number]  PCP:  Barbie Banner, MD  Cardiologist:  Mayford Knife Primary Electrophysiologist:  Jennell Janosik Jorja Loa, MD    Chief Complaint: AF   History of Present Illness: Christy Pham is a 74 y.o. female who is being seen today for the evaluation of AF at the request of Turner, Cornelious Bryant, MD. Presenting today for electrophysiology evaluation.  He has a history of hypertension, hyperlipidemia, and atrial fibrillation.  She has had atrial fibrillation for many years.  She also has Parkinson's disease and has had this for 25 years.  She is currently wheelchair-bound.  She was hospitalized last month for confusion and was diagnosed with urosepsis.  This is complicated by rapid atrial fibrillation but did have bradycardia on telemetry during sleep.  She was placed on CPAP at night which helped with nocturnal bradycardia.  An outpatient event monitor was ordered and she was discharged on digoxin and metoprolol.  Today, she denies*** symptoms of palpitations, chest pain, shortness of breath, orthopnea, PND, lower extremity edema, claudication, dizziness, presyncope, syncope, bleeding, or neurologic sequela. The patient is tolerating medications without difficulties.    Past Medical History:  Diagnosis Date  . Chronic atrial fibrillation (HCC)   . Depression   . Diabetes (HCC)   . Hyperlipidemia    dx circa 2008  . Hypertension    dx circa 2008  . Mitral regurgitation    mild to moderate by echo 2013  . Osteoarthritis    "L knee primarily" (05/17/2014)  . Parkinson's disease    dx circa 1990 by Dr. Thad Ranger   Past Surgical History:  Procedure Laterality Date  . COLONOSCOPY     2006 by Jeani Hawking (clear per pt)  . TONSILLECTOMY  ~ 1950  . TUBAL LIGATION  ~ 1983     Current Outpatient Medications  Medication Sig Dispense Refill  . carbidopa-levodopa-entacapone (STALEVO) 31.25-125-200  MG tablet 2 tablets at 7 am, 1 at 11 am, 2 at 4 pm 450 tablet 1  . digoxin (LANOXIN) 0.125 MG tablet TAKE ONE TABLET BY MOUTH DAILY. PLEASE MAKE OVERDUE APPOINTMENT WITH DR. Mayford Knife BEFORE ANYMORE REFILLS 90 tablet 3  . FLUoxetine (PROZAC) 20 MG capsule Take 20 mg by mouth daily.    . metFORMIN (GLUCOPHAGE-XR) 500 MG 24 hr tablet Take 500 mg by mouth daily.     . metoprolol tartrate (LOPRESSOR) 25 MG tablet Take 0.5 tablets (12.5 mg total) by mouth 2 (two) times daily. 30 tablet 0  . Multiple Vitamin (MULTIVITAMIN) tablet Take 1 tablet by mouth daily.    . Multiple Vitamins-Minerals (EYE VITAMINS PO) Take 2 tablets by mouth daily.     . rosuvastatin (CRESTOR) 20 MG tablet Take 20 mg by mouth at bedtime.    Marland Kitchen warfarin (COUMADIN) 7.5 MG tablet Take 7.5 mg by mouth daily.     No current facility-administered medications for this visit.    Allergies:   Patient has no known allergies.   Social History:  The patient  reports that she has never smoked. She has never used smokeless tobacco. She reports current alcohol use of about 3.0 standard drinks of alcohol per week. She reports that she does not use drugs.   Family History:  The patient's family history includes Colon cancer (age of onset: 17) in her father; Coronary artery disease (age of onset: 51) in her father; Dementia in her mother; Restless legs syndrome  in her father.    ROS:  Please see the history of present illness.   Otherwise, review of systems is positive for none.   All other systems are reviewed and negative.    PHYSICAL EXAM: VS:  There were no vitals taken for this visit. , BMI There is no height or weight on file to calculate BMI. GEN: Well nourished, well developed, in no acute distress  HEENT: normal  Neck: no JVD, carotid bruits, or masses Cardiac: ***RRR; no murmurs, rubs, or gallops,no edema  Respiratory:  clear to auscultation bilaterally, normal work of breathing GI: soft, nontender, nondistended, + BS MS: no  deformity or atrophy  Skin: warm and dry Neuro:  Strength and sensation are intact Psych: euthymic mood, full affect  EKG:  EKG {ACTION; IS/IS [id number]} ordered today. Personal review of the ekg ordered *** shows ***  Recent Labs: 01/02/2020: ALT 20; B Natriuretic Peptide 70.4; TSH 2.402 01/05/2020: BUN 12; Creatinine, Ser 0.66; Hemoglobin 12.4; Magnesium 2.3; Platelets 268; Potassium 3.5; Sodium 140    Lipid Panel     Component Value Date/Time   CHOL  12/03/2009 0515    156        ATP III CLASSIFICATION:  <200     mg/dL   Desirable  673-419  mg/dL   Borderline High  >=379    mg/dL   High          TRIG 79 12/03/2009 0515   HDL 57 12/03/2009 0515   CHOLHDL 2.7 12/03/2009 0515   VLDL 16 12/03/2009 0515   LDLCALC  12/03/2009 0515    83        Total Cholesterol/HDL:CHD Risk Coronary Heart Disease Risk Table                     Men   Women  1/2 Average Risk   3.4   3.3  Average Risk       5.0   4.4  2 X Average Risk   9.6   7.1  3 X Average Risk  23.4   11.0        Use the calculated Patient Ratio above and the CHD Risk Table to determine the patient's CHD Risk.        ATP III CLASSIFICATION (LDL):  <100     mg/dL   Optimal  024-097  mg/dL   Near or Above                    Optimal  130-159  mg/dL   Borderline  353-299  mg/dL   High  >242     mg/dL   Very High     Wt Readings from Last 3 Encounters:  02/17/20 185 lb (83.9 kg)  01/01/20 200 lb (90.7 kg)  09/03/18 190 lb (86.2 kg)      Other studies Reviewed: Additional studies/ records that were reviewed today include: TTE 11/27/19  Review of the above records today demonstrates:  1. The left ventricle has normal systolic function with an ejection  fraction of 60-65%. The cavity size was normal. There is mildly increased  left ventricular wall thickness. Left ventricular diastolic function could  not be evaluated secondary to atrial  fibrillation.  2. The right ventricle has normal systolic function. The  cavity was  normal.  3. Left atrial size was severely dilated.  4. The mitral valve is abnormal. Mild thickening of the mitral valve  leaflet. There is mild mitral annular calcification  present.  5. The aortic valve is tricuspid. Mild calcification of the aortic valve.  No stenosis of the aortic valve.  6. Normal LV function; mild LVH; severe LAE.   Cardiac monitor 03/15/2020 personally reviewed  Atrial Fibrillation with average heart rate 69bpm. The heart rate ranged from 45 to 148bpm.  Pauses up to 4.2 seconds during sleep and while awake.  ASSESSMENT AND PLAN:  1.  Permanent atrial fibrillation: CHA2DS2-VASc of 3.  Currently on metoprolol, digoxin, Coumadin.***  2.  Hypertension: Continue metoprolol per primary cardiology  3.  Tachybradycardia syndrome: Was discovered when hospitalized with sepsis. Wore cardiac monitor that showed atrial fibrillation with rapid rates and also 4.2-second pauses.    Current medicines are reviewed at length with the patient today.   The patient {ACTIONS; HAS/DOES NOT HAVE:19233} concerns regarding her medicines.  The following changes were made today:  {NONE DEFAULTED:18576::"none"}  Labs/ tests ordered today include: *** No orders of the defined types were placed in this encounter.    Disposition:   FU with Casyn Becvar {gen number [id number]} {Days to years:10300}  Signed, Brystal Kildow Jorja Loa, MD  03/22/2020 8:37 AM     [facility] [address] [place] [address] [phone] (office) [phone] (fax)

## 2020-03-31 ENCOUNTER — Ambulatory Visit (INDEPENDENT_AMBULATORY_CARE_PROVIDER_SITE_OTHER): Payer: Medicare Other | Admitting: Cardiology

## 2020-03-31 ENCOUNTER — Other Ambulatory Visit: Payer: Self-pay

## 2020-03-31 ENCOUNTER — Encounter: Payer: Self-pay | Admitting: Cardiology

## 2020-03-31 VITALS — BP 132/68 | HR 61 | Ht 64.0 in | Wt 181.0 lb

## 2020-03-31 DIAGNOSIS — I4821 Permanent atrial fibrillation: Secondary | ICD-10-CM

## 2020-03-31 NOTE — Progress Notes (Signed)
Electrophysiology Office Note   Date:  03/31/2020   ID:  Christy Pham, DOB Dec 15, 1945, MRN [id number]  PCP:  Barbie Banner, MD  Cardiologist:  Mayford Knife Primary Electrophysiologist:  Talonda Artist Jorja Loa, MD    Chief Complaint: AF   History of Present Illness: Christy Pham is a 74 y.o. female who is being seen today for the evaluation of AF at the request of Turner, Cornelious Bryant, MD. Presenting today for electrophysiology evaluation.  She has a history of atrial fibrillation, diabetes, hyperlipidemia, hypertension, mitral regurgitation, and Parkinson's disease.  She has had Parkinson's disease for over 25 years.  She is currently wheelchair-bound.  In August 2021, she was admitted to the hospital with confusion and was found to have urosepsis complicated by rapid atrial fibrillation.  She had bradycardia on telemetry during sleep.  She was placed on CPAP at night which improved her nocturnal bradycardia.  She was discharged on home doses of digoxin and Lopressor.  Today, she denies symptoms of palpitations, chest pain, shortness of breath, orthopnea, PND, lower extremity edema, claudication, dizziness, presyncope, syncope, bleeding, or neurologic sequela. The patient is tolerating medications without difficulties.  Since being discharged, she has done well.  She has not had any symptoms of syncope or near syncope.  She felt well while wearing her monitor and was unaware of tachycardia or bradycardia.   Past Medical History:  Diagnosis Date  . Chronic atrial fibrillation (HCC)   . Depression   . Diabetes (HCC)   . Hyperlipidemia    dx circa 2008  . Hypertension    dx circa 2008  . Mitral regurgitation    mild to moderate by echo 2013  . Osteoarthritis    "L knee primarily" (05/17/2014)  . Parkinson's disease    dx circa 1990 by Dr. Thad Ranger   Past Surgical History:  Procedure Laterality Date  . COLONOSCOPY     2006 by Jeani Hawking (clear per pt)  . TONSILLECTOMY  ~ 1950  .  TUBAL LIGATION  ~ 1983     Current Outpatient Medications  Medication Sig Dispense Refill  . OLANZapine (ZYPREXA) 2.5 MG tablet Take 2.5 mg by mouth at bedtime.    . carbidopa-levodopa-entacapone (STALEVO) 31.25-125-200 MG tablet 2 tablets at 7 am, 1 at 11 am, 2 at 4 pm 450 tablet 1  . digoxin (LANOXIN) 0.125 MG tablet TAKE ONE TABLET BY MOUTH DAILY. PLEASE MAKE OVERDUE APPOINTMENT WITH DR. Mayford Knife BEFORE ANYMORE REFILLS 90 tablet 3  . FLUoxetine (PROZAC) 20 MG capsule Take 20 mg by mouth daily.    . metFORMIN (GLUCOPHAGE-XR) 500 MG 24 hr tablet Take 500 mg by mouth daily.     . metoprolol tartrate (LOPRESSOR) 25 MG tablet Take 0.5 tablets (12.5 mg total) by mouth 2 (two) times daily. 30 tablet 0  . Multiple Vitamin (MULTIVITAMIN) tablet Take 1 tablet by mouth daily.    . Multiple Vitamins-Minerals (EYE VITAMINS PO) Take 2 tablets by mouth daily.     . rosuvastatin (CRESTOR) 20 MG tablet Take 20 mg by mouth at bedtime.    Marland Kitchen warfarin (COUMADIN) 7.5 MG tablet Take 7.5 mg by mouth daily.     No current facility-administered medications for this visit.    Allergies:   Patient has no known allergies.   Social History:  The patient  reports that she has never smoked. She has never used smokeless tobacco. She reports current alcohol use of about 3.0 standard drinks of alcohol per week. She reports that  she does not use drugs.   Family History:  The patient's family history includes Colon cancer (age of onset: 64) in her father; Coronary artery disease (age of onset: 12) in her father; Dementia in her mother; Restless legs syndrome in her father.    ROS:  Please see the history of present illness.   Otherwise, review of systems is positive for none.   All other systems are reviewed and negative.    PHYSICAL EXAM: VS:  BP 132/68   Pulse 61   Ht 5\' 4"  (1.626 m)   Wt 181 lb (82.1 kg)   SpO2 97%   BMI 31.07 kg/m  , BMI Body mass index is 31.07 kg/m. GEN: Well nourished, well developed, in  no acute distress  HEENT: normal  Neck: no JVD, carotid bruits, or masses Cardiac: irregular; no murmurs, rubs, or gallops,no edema  Respiratory:  clear to auscultation bilaterally, normal work of breathing GI: soft, nontender, nondistended, + BS MS: no deformity or atrophy  Skin: warm and dry Neuro:  Strength and sensation are intact Psych: euthymic mood, full affect  EKG:  EKG is ordered today. Personal review of the ekg ordered shows atrial fibrillation, rate 56  Recent Labs: 01/02/2020: ALT 20; B Natriuretic Peptide 70.4; TSH 2.402 01/05/2020: BUN 12; Creatinine, Ser 0.66; Hemoglobin 12.4; Magnesium 2.3; Platelets 268; Potassium 3.5; Sodium 140    Lipid Panel     Component Value Date/Time   CHOL  12/03/2009 0515    156        ATP III CLASSIFICATION:  <200     mg/dL   Desirable  02/03/2010  mg/dL   Borderline High  270-623    mg/dL   High          TRIG 79 12/03/2009 0515   HDL 57 12/03/2009 0515   CHOLHDL 2.7 12/03/2009 0515   VLDL 16 12/03/2009 0515   LDLCALC  12/03/2009 0515    83        Total Cholesterol/HDL:CHD Risk Coronary Heart Disease Risk Table                     Men   Women  1/2 Average Risk   3.4   3.3  Average Risk       5.0   4.4  2 X Average Risk   9.6   7.1  3 X Average Risk  23.4   11.0        Use the calculated Patient Ratio above and the CHD Risk Table to determine the patient's CHD Risk.        ATP III CLASSIFICATION (LDL):  <100     mg/dL   Optimal  02/03/2010  mg/dL   Near or Above                    Optimal  130-159  mg/dL   Borderline  831-517  mg/dL   High  616-073     mg/dL   Very High     Wt Readings from Last 3 Encounters:  03/31/20 181 lb (82.1 kg)  02/17/20 185 lb (83.9 kg)  01/01/20 200 lb (90.7 kg)      Other studies Reviewed: Additional studies/ records that were reviewed today include: TTE 11/27/18  Review of the above records today demonstrates:  1. The left ventricle has normal systolic function with an ejection  fraction of  60-65%. The cavity size was normal. There is mildly increased  left ventricular wall  thickness. Left ventricular diastolic function could  not be evaluated secondary to atrial  fibrillation.  2. The right ventricle has normal systolic function. The cavity was  normal.  3. Left atrial size was severely dilated.  4. The mitral valve is abnormal. Mild thickening of the mitral valve  leaflet. There is mild mitral annular calcification present.  5. The aortic valve is tricuspid. Mild calcification of the aortic valve.  No stenosis of the aortic valve.  6. Normal LV function; mild LVH; severe LAE.   Cardiac monitor 03/15/2020 personally reviewed  Atrial Fibrillation with average heart rate 69bpm. The heart rate ranged from 45 to 148bpm.  Pauses up to 4.2 seconds during sleep and while awake.  ASSESSMENT AND PLAN:  1.  Permanent atrial fibrillation: Currently on metoprolol and digoxin.  Also on warfarin monitored by her PCP.  CHA2DS2-VASc of at least 3.  2.  Tachybradycardia syndrome: Occurred while she was hospitalized for sepsis.  She had some bradycardia.  She was placed on CPAP as there was concern for underlying sleep apnea.  She has a home sleep study scheduled.  She wore a cardiac monitor that showed episodes of bradycardia with up to 4.2-second pauses and a few episodes of tachycardia.  The patient was completely asymptomatic and was unaware of these issues.  At this point, with the patient's debilitation from her Parkinson's, and a wheelchair chronically, and asymptomatic from her arrhythmia, I would hold off on pacemaker implant.  If she does develop symptoms, would revisit indications for pacemaker.  Case discussed with primary cardiology  Current medicines are reviewed at length with the patient today.   The patient does not have concerns regarding her medicines.  The following changes were made today:  none  Labs/ tests ordered today include:  Orders Placed This Encounter    Procedures  . EKG 12-Lead     Disposition:   FU with [facility] as needed months  Signed, Herve Haug Jorja Loa, MD  03/31/2020 11:15 AM     [facility] [address] [place] [address] [phone] (office) [phone] (fax)

## 2020-03-31 NOTE — Patient Instructions (Signed)
Medication Instructions:  Your physician recommends that you continue on your current medications as directed. Please refer to the Current Medication list given to you today.  *If you need a refill on your cardiac medications before your next appointment, please call your pharmacy*   Lab Work: None ordered   Testing/Procedures: None ordered   Follow-Up: At [facility], you and your health needs are our priority.  As part of our continuing mission to provide you with exceptional heart care, we have created designated Provider Care Teams.  These Care Teams include your primary Cardiologist (physician) and Advanced Practice Providers (APPs -  Physician Assistants and Nurse Practitioners) who all work together to provide you with the care you need, when you need it.  We recommend signing up for the patient portal called "MyChart".  Sign up information is provided on this After Visit Summary.  MyChart is used to connect with patients for Virtual Visits (Telemedicine).  Patients are able to view lab/test results, encounter notes, upcoming appointments, etc.  Non-urgent messages can be sent to your provider as well.   To learn more about what you can do with MyChart, go to [email or web address].    Your next appointment:    as needed  The format for your next appointment:   In Person  Provider:   Will Camnitz, MD    Thank you for choosing [facility]!!   Oceania Noori, RN [phone]     

## 2020-06-17 NOTE — Progress Notes (Deleted)
Assessment/Plan:   1.  Parkinsons Disease  -***Long discussion with the patient about compliance.  This has long been an issue.  I have not seen her in 2-1/2 years, and it appears that her primary care provider has not seen her in 2 years, although she has an appointment tomorrow. 1.  Parkinsons             -change Stalevo 125 mg to 2 at 7am/1 at 11am/2 at 4 pm             -d/c IR carbidopa/levodopa 25/100.               -told pt not to change meds without talking to me             -continue amantadine 100 mg bid.  Higher dosages caused hallucinations             -requip has been d/c due to hallucinations and cognitive change.               -encouraged exercise 2.  Excess EtOH use             -  She reports that she is no longer drinking EtOH and confirmed again about that today (01/28/18)  3.  Depression.             -Has been the greatest barrier to good health. Discussed this with her new caregiver today.  On Prozac.  Following with PCP.  Has not returned to therapist.  Relies on caregiving   Subjective:   Christy Pham was seen today in follow up for Parkinsons disease.  My previous records were reviewed prior to todays visit as well as outside records available to me.  Patient has not been seen in my office for about 2-1/2 years.  She had an appointment about a year ago (just phone visit) but she no showed that.  Records are reviewed.  She was in the hospital in August with sepsis from urinary tract infection and new onset atrial fibrillation with rapid ventricular response.  She is following now with cardiology.  I looked through her primary care records, and it does not appear that she has had an office visit with them since February, 2020.  I do see that she had some home health coming to the house and they called primary care about increasing hallucinations and the fact that the patient had been placed on Zyprexa by a nursing facility and they were asking about potentially  increasing that medication.    Current prescribed movement disorder medications: ***Stalevo 125 mg, 2 tablets at 7 AM/1 tablet at 11 AM/2 tablets at 4 PM (not prescribed by me since 2019) Amantadine 100 mg twice per day (higher dosages caused hallucinations)   PREVIOUS MEDICATIONS: Requip (discontinued because of cognitive change and hallucinations); trihexyphenidyl; levodopa; amantadine  ALLERGIES:  No Known Allergies  CURRENT MEDICATIONS:  Outpatient Encounter Medications as of 06/20/2020  Medication Sig  . carbidopa-levodopa-entacapone (STALEVO) 31.25-125-200 MG tablet 2 tablets at 7 am, 1 at 11 am, 2 at 4 pm  . digoxin (LANOXIN) 0.125 MG tablet TAKE ONE TABLET BY MOUTH DAILY. PLEASE MAKE OVERDUE APPOINTMENT WITH DR. Mayford Knife BEFORE ANYMORE REFILLS  . FLUoxetine (PROZAC) 20 MG capsule Take 20 mg by mouth daily.  . metFORMIN (GLUCOPHAGE-XR) 500 MG 24 hr tablet Take 500 mg by mouth daily.   . metoprolol tartrate (LOPRESSOR) 25 MG tablet Take 0.5 tablets (12.5 mg total) by mouth 2 (two)  times daily.  . Multiple Vitamin (MULTIVITAMIN) tablet Take 1 tablet by mouth daily.  . Multiple Vitamins-Minerals (EYE VITAMINS PO) Take 2 tablets by mouth daily.   Marland Kitchen OLANZapine (ZYPREXA) 2.5 MG tablet Take 2.5 mg by mouth at bedtime.  . rosuvastatin (CRESTOR) 20 MG tablet Take 20 mg by mouth at bedtime.  Marland Kitchen warfarin (COUMADIN) 7.5 MG tablet Take 7.5 mg by mouth daily.   No facility-administered encounter medications on file as of 06/20/2020.    Objective:   PHYSICAL EXAMINATION:    VITALS:  There were no vitals filed for this visit.  GEN:  The patient appears stated age and is in NAD. HEENT:  Normocephalic, atraumatic.  The mucous membranes are moist. The superficial temporal arteries are without ropiness or tenderness. CV:  RRR Lungs:  CTAB Neck/HEME:  There are no carotid bruits bilaterally.  Neurological examination:  Orientation: The patient is alert and oriented x3. Cranial nerves: There  is good facial symmetry with*** facial hypomimia. The speech is fluent and clear. Soft palate rises symmetrically and there is no tongue deviation. Hearing is intact to conversational tone. Sensation: Sensation is intact to light touch throughout Motor: Strength is at least antigravity x4.  Movement examination: Tone:  Mild increased tone in the LUE Abnormal movements: There is bilateral UE resting tremor and LLE resting tremor Coordination:  There is decremation with all form of RAMS, including alternating supination and pronation of the forearm, hand opening and closing, finger taps, heel taps and toe taps on the L.  She has trouble with with alternating supination and pronation of the forearm heel taps and toe taps on right. Gait and Station: no longer ambulates.  In [facility]  I have reviewed and interpreted the following labs independently    Chemistry      Component Value Date/Time   NA 140 01/05/2020 0515   K 3.5 01/05/2020 0515   CL 104 01/05/2020 0515   CO2 26 01/05/2020 0515   BUN 12 01/05/2020 0515   CREATININE 0.66 01/05/2020 0515   CREATININE 0.65 12/11/2013 1150      Component Value Date/Time   CALCIUM 9.1 01/05/2020 0515   ALKPHOS 45 01/02/2020 0549   AST 20 01/02/2020 0549   ALT 20 01/02/2020 0549   BILITOT 0.9 01/02/2020 0549       Lab Results  Component Value Date   WBC 7.7 01/05/2020   HGB 12.4 01/05/2020   HCT 39.2 01/05/2020   MCV 96.6 01/05/2020   PLT 268 01/05/2020    Lab Results  Component Value Date   TSH 2.402 01/02/2020     Total time spent on today's visit was ***30 minutes, including both face-to-face time and nonface-to-face time.  Time included that spent on review of records (prior notes available to me/labs/imaging if pertinent), discussing treatment and goals, answering patient's questions and coordinating care.  Cc:  Barbie Banner, MD

## 2020-06-20 ENCOUNTER — Ambulatory Visit: Payer: Medicare Other | Admitting: Neurology

## 2020-06-20 ENCOUNTER — Encounter: Payer: Self-pay | Admitting: Neurology

## 2020-06-20 DIAGNOSIS — Z029 Encounter for administrative examinations, unspecified: Secondary | ICD-10-CM

## 2020-06-21 ENCOUNTER — Telehealth: Payer: Self-pay | Admitting: Neurology

## 2020-06-21 NOTE — Telephone Encounter (Signed)
Patient dismissed from [facility] by Lurena Joiner Tat, DO, effective 05/29/2020. Dismissal Letter sent out by 1st class mail. [employer]

## 2020-06-28 ENCOUNTER — Telehealth: Payer: Self-pay

## 2020-06-28 NOTE — Telephone Encounter (Signed)
Spoke with patient and scheduled an in-person Palliative Consult for 07/07/20 @ 8:30AM  COVID screening was negative. No pets in home. Patient lives with husband Christy Pham.  Consent obtained; updated Outlook/Netsmart/Team List and Epic.  Family is aware they will be receiving a call from NP the day before or day of to confirm appointment.

## 2020-07-07 ENCOUNTER — Other Ambulatory Visit: Payer: Medicare Other | Admitting: Nurse Practitioner

## 2020-07-07 ENCOUNTER — Other Ambulatory Visit: Payer: Self-pay

## 2020-07-07 DIAGNOSIS — G20 Parkinson's disease: Secondary | ICD-10-CM

## 2020-07-07 DIAGNOSIS — Z515 Encounter for palliative care: Secondary | ICD-10-CM

## 2020-07-07 NOTE — Progress Notes (Addendum)
[facility] Consult Note Telephone: [phone]  Fax: [phone]  PATIENT NAME: Christy Pham [facility] [place] [id number] [phone] (home)  DOB: May 04, 1946 MRN: [id number]  PRIMARY CARE PROVIDER:    [name], MD,  [address] [place] [address] [phone]  REFERRING PROVIDER:   [name], MD [address] [place],   [address] [phone]  RESPONSIBLE PARTY:   Extended Emergency Contact Information Primary Emergency Contact: Round,William Address: [place] [address] [address] of [place] Phone: [phone] Mobile Phone: [phone] Relation: Spouse Secondary Emergency Contact: [place] of [place] Mobile Phone: [phone] Relation: Son  I met face to face with patient in home. Care giver Tiffany and husband in home during visit.   ASSESSMENT AND RECOMMENDATIONS:   Advance Care Planning: Today's visit consisted of building trust and discussions on Palliative care medicine as a specialized medical care for people living with serious illness, aimed at facilitating improved quality of life through symptoms relief, assisting with advance care planning and establishing goals of care. Patient expressed appreciation for education provided on Palliative care and how it differs from Hospice service.  Goal of care: Patient's goal of care is function. Patient desires to be able to self transfer from bed to wheel chair, desires to use her motorized wheelchair, states impediment to doing this is tremors related to her parkinson disease. Report last able to tansfer was 6 months ago. Directives: Patient has a living will. After discussions on ramifications and implications of Code Status, patient made a decision to not be resuscitated in the event of cardiac or respiratory arrest. DNR form signed for patient, copy uploaded to [facility] EMR.   Symptom Management:  Visual Hallucination: Patient report ongoing bothersome visual hallucinations. She was started on Seroquel $RemoveBef'25mg'HNvztIhbag$  by her PCP, with goal of adjusting it as needed, patient report no change in symptom, Amantadine was stopped during her last hospitalization 6 months ago. She is followed by Neurology Dr. Carles Collet for her Parkinson's diease, has an up coming face to face appointment on 07/12/2020. Will defer med adjustment to Neurology. She denied auditory hallucination, denied headaches, denied insomnia, report feeling well overall. On Coumadin for Afib, no report of acute bleeding, denied palpitation. Pain: Report occasional arthritic pain which is relieved by PRN Tylenol. Denied pain today. Questions and concerns were addressed. The patient and her caregiver wwere encouraged to call with questions and/or concerns. My business card was provided. Provided general support and encouragement, no other unmet needs identified. Palliative care will continue to provide support to patient, family and the medical team.  Follow up Palliative Care Visit: Palliative care will continue to follow for goals of care clarification and symptom management. Return in about 4 weeks or prn.  Family /Caregiver/Community Supports: Patient lives at home with husband. Receives personal caregiver asistance 3 hrs in mornings and 3 hrs in evenings from staff of [facility], caregiver Jonelle Sidle is her regular caregiver. Husband assist with other household chores. Patient son lives close to patient.  Cognitive / Functional decline: Patient awake, alert and coherent. She is able to make her own decisions. Patient is total care, she is however able to feed self. She is bed bound.   I spent 60 minutes providing this consultation, time includes time spent with patient, chart review, provider coordination, and documentation. More than 50% of the  time in this consultation was spent counseling and coordinating  communication.   CHIEF COMPLAINT: visual hallucination  History obtained from review of EMR, discussion with caregiver and  interview with patient. Records reviewed and summarized bellow.  HISTORY OF PRESENT ILLNESS:  ENZLEY KITCHENS is a 75 y.o. year old female with multiple medical problems including Parkinson disease, Afib on Coumadin, type 2 diabetes, HLD, HTN. Palliative Care was asked to follow this patient by consultation request of [name], MD to help address advance care planning and goals of care. This is an initial visit.  CODE STATUS: DNR  PPS: 30%  HOSPICE ELIGIBILITY/DIAGNOSIS: TBD  PHYSICAL EXAM / ROS:   General: NAD, frail appearing, obese, lying in bed in NAD Cardiovascular: no chest pain reported, no edema  Pulmonary: no cough, no increased SOB, room air GI: no swallowing issues, appetite fair, denied constipation, incontinent of bowel GU: denies dysuria, incontinent of urine MSK:  no joint and ROM abnormalities, non-ambulatory Skin: no rashes or wounds reported Neurological: Weakness, tremors, but otherwise nonfocal Psych: non -anxious affect  PAST MEDICAL HISTORY:  Past Medical History:  Diagnosis Date  . Chronic atrial fibrillation ([facility])   . Depression   . Diabetes ([facility])   . Hyperlipidemia    dx circa 2008  . Hypertension    dx circa 2008  . Mitral regurgitation    mild to moderate by echo 2013  . Osteoarthritis    "L knee primarily" (05/17/2014)  . Parkinson's disease    dx circa 1990 by Dr. Doy Mince    SOCIAL HX:  Social History   Tobacco Use  . Smoking status: Never Smoker  . Smokeless tobacco: Never Used  Substance Use Topics  . Alcohol use: Yes    Alcohol/week: 3.0 standard drinks    Types: 3 Glasses of wine per week    Comment: 3 times per week (wine)   FAMILY HX:  Family History  Problem Relation Age of Onset  . Coronary artery disease Father 44       s/p CABG  . Colon cancer Father 47       s/p resection  . Restless  legs syndrome Father   . Dementia Mother     ALLERGIES: No Known Allergies   PERTINENT MEDICATIONS:  Outpatient Encounter Medications as of 07/07/2020  Medication Sig  . carbidopa-levodopa-entacapone (STALEVO) 31.25-125-200 MG tablet 2 tablets at 7 am, 1 at 11 am, 2 at 4 pm  . digoxin (LANOXIN) 0.125 MG tablet TAKE ONE TABLET BY MOUTH DAILY. PLEASE MAKE OVERDUE APPOINTMENT WITH DR. Radford Pax BEFORE ANYMORE REFILLS  . FLUoxetine (PROZAC) 20 MG capsule Take 20 mg by mouth daily.  . metFORMIN (GLUCOPHAGE-XR) 500 MG 24 hr tablet Take 500 mg by mouth daily.   . metoprolol tartrate (LOPRESSOR) 25 MG tablet Take 0.5 tablets (12.5 mg total) by mouth 2 (two) times daily.  . Multiple Vitamin (MULTIVITAMIN) tablet Take 1 tablet by mouth daily.  . Multiple Vitamins-Minerals (EYE VITAMINS PO) Take 2 tablets by mouth daily.   Marland Kitchen OLANZapine (ZYPREXA) 2.5 MG tablet Take 2.5 mg by mouth at bedtime.  . rosuvastatin (CRESTOR) 20 MG tablet Take 20 mg by mouth at bedtime.  Marland Kitchen warfarin (COUMADIN) 7.5 MG tablet Take 7.5 mg by mouth daily.   No facility-administered encounter medications on file as of 07/07/2020.    Thank you for the opportunity to participate in the care of Ms. Reola Calkins. The palliative care team will continue to follow. Please call our office at  [phone] if we can be of additional assistance.  Jari Favre, DNP, AGPCNP-BC

## 2020-07-12 ENCOUNTER — Ambulatory Visit: Payer: Medicare Other | Admitting: Neurology

## 2020-08-05 ENCOUNTER — Other Ambulatory Visit: Payer: Medicare Other | Admitting: Nurse Practitioner

## 2020-08-05 ENCOUNTER — Other Ambulatory Visit: Payer: Self-pay

## 2020-08-05 DIAGNOSIS — Z515 Encounter for palliative care: Secondary | ICD-10-CM

## 2020-08-05 DIAGNOSIS — G20 Parkinson's disease: Secondary | ICD-10-CM

## 2020-08-05 NOTE — Progress Notes (Signed)
[facility] Consult Note Telephone: [phone]  Fax: [phone]  PATIENT NAME: Christy Pham Christy Pham [id number] [phone] (home)  DOB: 1945/11/16 MRN: [id number]  PRIMARY CARE PROVIDER:    [name], MD,  [address] [place] [address] [phone]  REFERRING PROVIDER:   [name], MD [address] [place],  [place] [address] [phone]  RESPONSIBLE PARTY:   Extended Emergency Contact Information Primary Emergency Contact: Manfre,William Address: [place] [address] [address] of [place] Phone: [phone] Mobile Phone: [phone] Relation: Spouse Secondary Emergency Contact: [place] of [place] Mobile Phone: [phone] Relation: Son  I met face to face with patient in home.    ASSESSMENT AND RECOMMENDATIONS:   Advance Care Planning: Patient's goal of care is comfort while preserving function. Pateint has a living will. Signed DNR form in home, copy on [facility] EMR.  Symptom Management:  Visual Hallucination: Symptoms ongoing and bothersome. Patient report seeing people surrounding her and coming close to her face, report symptoms occurring all the time, it however does not affect her sleep. Patient was discharged from Neurology due to missing appointments. Her PCP started her on Seroquel 76m at bed time. Patient report no improvement in symptoms with Seroquel 286m Patient also report inability to refilll her Sinemet prescription, her pharmacy was unable to reach her prescriber. She however has some pills to last her through the week. Patient and caregiver voiced no other concerns, denied uncontrolled pain, no report of acute bleeding.  Recommendation: Recommend increasing frequency of Seroquel 7624mo morning and night. Phone call made to her Pharmacy and was told the last prescriber of her Sinemet was  [facility] Massed[facility]. Will reach out to the prescriber for clarifications. Phone call made to patient's PCP to discuss visit recommendations, PCP not in office today, left message with her office. Will attempt to reach again.  Diabetes: Patient tolerating current regimen of Metformin-XR 500m60mily. Patient does not have blood glucose monitoring device in home. Reviewed last lab result on file in Epic, on 01/05/2020 Cr 0.66, BUN 12, GFR >60, no A1c result noted. Recommendation: Recommend home CBG monitoring device. Recommend routine labs. Patient is bed bound and has difficulty going out for care. Patient may benefit from having PCP that can provide in home care. Will discuss possibility with her PCP.  Follow up Palliative Care Visit: Palliative care will continue to follow for complex decision making and symptom management. Return in about 4 weeks or prn.  Family /Caregiver/Community Supports: Patient lives at home with husband. Receives personal caregiver asistance 3 hrs in mornings and 3 hrs in evenings from [facility] is her regular caregiver was present at today's visit. Husband in home but did not participate in visit discussions.   Cognitive / Functional decline: Patient awake, alert and coherent. She is able to make her own decisions. Total care for ADLs, able to feed self. She is bed bound, unabe to reposition self in bed.  I spent 48 minutes providing this consultation, time includes time spent with patient/family, chart review, provider coordination, and documentation. More than 50% of the time in this consultation was spent counseling and coordinating communication.   CHIEF COMPLAINT: Visual Hallucination  History obtained from review of EMR and discussion with patient and her caregiver. Records reviewed and summarized bellow.  HISTORY  OF PRESENT ILLNESS:  Christy Pham is a 75 y.o. year old female with multiple medical problems including Parkinson disease,  Afib on Coumadin, type 2 diabetes, HLD, HTN. Palliative Care was asked to help address advance care planning, goals of care, and symptoms management. This is a follow up visit from 76/02/2021.  CODE STATUS: DNR  PPS: 30%  HOSPICE ELIGIBILITY/DIAGNOSIS: TBD  ROS Constitutional: denies fever, denies chills EYES: denies vision changes ENMT: denies dysphagia Cardiovascular: denies chest pain, denies palpitation Pulmonary: denies cough, denies increased SOB Abdomen: endorses good appetite, denies constipation, endorses incontinence of bowel GU: denies dysuria, endorses incontinence of urine MSK:  endorses ROM limitations, no falls reported Skin: denies rashes or wounds Neurological: endorses weakness, denies uncontrolled pain, denies insomnia Psych: Endorses positive mood Heme/lymph/immuno: denies bruises, abnormal bleeding   Physical Exam: General: frail appearing, cooperative, lying in bed in NAD EYES: anicteric sclera, lids intact, clear discharge noted ENMT: intact hearing, oral mucous membranes moist CV:  Trace BLE edema Pulmonary: no increased work of breathing, no cough, no audible wheezes, room air Abdomen: no ascites GU: deferred MSK: severe sarcopenia, decreased ROM in all extremities, non ambulatory Skin: warm and dry, no rashes or wounds on visible skin Neuro: Generalized weakness, A and O x 3 Psych: non-anxious affect today Hem/lymph/immuno: no widespread bruising   PAST MEDICAL HISTORY:  Past Medical History:  Diagnosis Date  . Chronic atrial fibrillation ([facility])   . Depression   . Diabetes ([facility])   . Hyperlipidemia    dx circa 2008  . Hypertension    dx circa 2008  . Mitral regurgitation    mild to moderate by echo 2013  . Osteoarthritis    "L knee primarily" (05/17/2014)  . Parkinson's disease    dx circa 1990 by Dr. Doy Mince    SOCIAL HX:  Social History   Tobacco Use  . Smoking status: Never Smoker  . Smokeless tobacco: Never Used  Substance Use  Topics  . Alcohol use: Yes    Alcohol/week: 3.0 standard drinks    Types: 3 Glasses of wine per week    Comment: 3 times per week (wine)   FAMILY HX:  Family History  Problem Relation Age of Onset  . Coronary artery disease Father 9       s/p CABG  . Colon cancer Father 24       s/p resection  . Restless legs syndrome Father   . Dementia Mother     ALLERGIES: No Known Allergies   PERTINENT MEDICATIONS:  Outpatient Encounter Medications as of 08/05/2020  Medication Sig  . carbidopa-levodopa-entacapone (STALEVO) 31.25-125-200 MG tablet 2 tablets at 7 am, 1 at 11 am, 2 at 4 pm  . digoxin (LANOXIN) 0.125 MG tablet TAKE ONE TABLET BY MOUTH DAILY. PLEASE MAKE OVERDUE APPOINTMENT WITH DR. Radford Pax BEFORE ANYMORE REFILLS  . FLUoxetine (PROZAC) 20 MG capsule Take 20 mg by mouth daily.  . metFORMIN (GLUCOPHAGE-XR) 500 MG 24 hr tablet Take 500 mg by mouth daily.   . metoprolol tartrate (LOPRESSOR) 25 MG tablet Take 0.5 tablets (12.5 mg total) by mouth 2 (two) times daily.  . Multiple Vitamin (MULTIVITAMIN) tablet Take 1 tablet by mouth daily.  . Multiple Vitamins-Minerals (EYE VITAMINS PO) Take 2 tablets by mouth daily.   Marland Kitchen OLANZapine (ZYPREXA) 2.5 MG tablet Take 2.5 mg by mouth at bedtime.  . rosuvastatin (CRESTOR) 20 MG tablet Take 20 mg by mouth at bedtime.  Marland Kitchen warfarin (COUMADIN) 7.5 MG tablet Take 7.5 mg by  mouth daily.   No facility-administered encounter medications on file as of 08/05/2020.    Thank you for the opportunity to participate in the care of Ms. Reola Calkins. The palliative care team will continue to follow. Please call our office at [phone] if we can be of additional assistance.   Alba Destine, NP , DNP, AGPCNP-BC

## 2020-09-08 ENCOUNTER — Other Ambulatory Visit: Payer: Medicare Other | Admitting: Nurse Practitioner

## 2020-09-08 ENCOUNTER — Other Ambulatory Visit: Payer: Self-pay

## 2020-09-08 DIAGNOSIS — Z7409 Other reduced mobility: Secondary | ICD-10-CM

## 2020-09-08 NOTE — Progress Notes (Addendum)
[facility] Consult Note Telephone: [phone]  Fax: [phone]    Date of encounter: 09/08/20 PATIENT NAME: Christy Pham [id number]   [phone] (home)  DOB: 11-01-1945 MRN: [id number] PRIMARY CARE PROVIDER:    [name], MD,  [address] [place] [address] [phone]  REFERRING PROVIDER:   [name], MD [address] [place],  [place] [address] [phone]  RESPONSIBLE PARTY:    Contact Information    Name Relation Home Work Mobile   [place] Spouse [phone]  [phone]   Eara, [place]   [phone]       I met face to face with patient home. Her sister from out of town present at visit. This is a joint visit with Missy registered nurse from [facility].  Palliative Care was asked to follow this patient by consultation request of  [name], MD to address advance care planning and complex medical decision making. This is a follow up visit.                                   ASSESSMENT AND PLAN / RECOMMENDATIONS:   Advance Care Planning/Goals of Care:  Code Status: DNR Goal of care: Patient's goal of care is comfort while preserving function.  Directives: Pateint has a living will. Signed DNR form in home, copy on [facility] EMR.  Symptom Management/Plan: Pressure ulcers: condition is ongoing and worsening, related to immobility. Patient has a hospital bed. Recommendation: recommend Home Health wound care and physical therapy. Recommend air mattress to relief pressure as patient is bed bound. Maintain skin integrity, provide prompt peri- care with incontinence episodes, continue use of barrier cream with peri-care. Addendum 09/09/2020 Called patient's PCP office with visit update and recommendation. Bing Matter not in office today, message left with clinic staff [place] [facility].  Follow up Palliative Care Visit: Palliative care will continue  to follow for complex medical decision making, advance care planning, and clarification of goals. Return in about 4 weeks or prn.  PPS: 30%  HOSPICE ELIGIBILITY/DIAGNOSIS: TBD  CHIEF COMPLAINT: wounds on buttocks   HISTORY OF PRESENT ILLNESS:Christy B Mullinsis a 75 y.o.year old femalewith multiple medical problems including Parkinson disease, Afib on Coumadin, type 2 diabetes, HLD, HTN.Missy, RN from [facility] called reporting patient with worsening pressure ulcers on bilateral buttocks. Per caregiver condition initially occurs intermittently and self limiting. However in the last one month, wounds has been persistent and worsening. Condition is aggravated by patient's immobility, and incontinent of bowel and bladder. Use of barrier cream has not helped. No report of fever, chills, SOB, or chest pain. Patient denied any uncontrolled pain, all 10 point systems reviewed and are negative except as documented in history of present illness above. History obtained from review of Epic EMR, discussion with patient and caregiver.  I reviewed available labs, medications, imaging, studies and related documents from the Epic EMR.  Records reviewed and summarized within.   Past Medical History:  Diagnosis Date  . Chronic atrial fibrillation ([facility])   . Depression   . Diabetes ([facility])   . Hyperlipidemia    dx circa 2008  . Hypertension    dx circa 2008  . Mitral regurgitation    mild to moderate by echo 2013  . Osteoarthritis    "L knee primarily" (05/17/2014)  . Parkinson's disease  dx circa 1990 by Dr. Doy Mince    Current Outpatient Medications on File Prior to Visit  Medication Sig Dispense Refill  . carbidopa-levodopa-entacapone (STALEVO) 31.25-125-200 MG tablet 2 tablets at 7 am, 1 at 11 am, 2 at 4 pm 450 tablet 1  . digoxin (LANOXIN) 0.125 MG tablet TAKE ONE TABLET BY MOUTH DAILY. PLEASE MAKE OVERDUE APPOINTMENT WITH DR. Radford Pax BEFORE ANYMORE REFILLS 90 tablet 3  . FLUoxetine (PROZAC) 20  MG capsule Take 20 mg by mouth daily.    . metFORMIN (GLUCOPHAGE-XR) 500 MG 24 hr tablet Take 500 mg by mouth daily.     . metoprolol tartrate (LOPRESSOR) 25 MG tablet Take 0.5 tablets (12.5 mg total) by mouth 2 (two) times daily. 30 tablet 0  . Multiple Vitamin (MULTIVITAMIN) tablet Take 1 tablet by mouth daily.    . Multiple Vitamins-Minerals (EYE VITAMINS PO) Take 2 tablets by mouth daily.     Marland Kitchen OLANZapine (ZYPREXA) 2.5 MG tablet Take 2.5 mg by mouth at bedtime.    . rosuvastatin (CRESTOR) 20 MG tablet Take 20 mg by mouth at bedtime.    Marland Kitchen warfarin (COUMADIN) 7.5 MG tablet Take 7.5 mg by mouth daily.     No current facility-administered medications on file prior to visit.    Physical Exam: General: chronically ill and frail appearing, cooperative, lying in bed in NAD EYES: anicteric sclera, clear discharge noted ENMT: intact hearing, oral mucous membranes moist CV: Trace BLE edema Pulmonary: no increased work of breathing, no cough, no audible wheezes, room air, RRR Abdomen:no ascites GU: deferred [id number], foot drop noted to bilateral lower extremity, non ambulatory Skin: warm and dry, stage 2 pressure ulcer on right buttock, and sacrum. Pressure ulcer stage 3 on left buttock. Neuro: Generalized weakness,A and O x 3 Psych: non-anxious affecttoday Hem/lymph/immuno: no widespread bruising   Thank you for the opportunity to participate in the care of Ms. MullinsThe palliative care team will continue to follow. Please call our office at [phone] if we can be of additional assistance.   This visit was coded based on medical decision making (MDM).  Jari Favre, DNP, AGPCNP-BC  COVID-19 PATIENT SCREENING TOOL Asked and negative response unless otherwise noted:   Have you had symptoms of covid, tested positive or been in contact with someone with symptoms/positive test in the past 5-10 days?

## 2020-10-11 ENCOUNTER — Other Ambulatory Visit: Payer: Medicare Other | Admitting: Nurse Practitioner

## 2020-10-11 ENCOUNTER — Telehealth: Payer: Self-pay

## 2020-10-11 ENCOUNTER — Other Ambulatory Visit: Payer: Self-pay

## 2020-10-11 VITALS — BP 118/54 | HR 59 | Resp 18

## 2020-10-11 DIAGNOSIS — L89322 Pressure ulcer of left buttock, stage 2: Secondary | ICD-10-CM

## 2020-10-11 DIAGNOSIS — Z515 Encounter for palliative care: Secondary | ICD-10-CM

## 2020-10-11 NOTE — Progress Notes (Signed)
Therapist, nutritional Palliative Care Consult Note Telephone: [phone]  Fax: [phone]    Date of encounter: 10/11/20 PATIENT NAME: Christy Pham [address] [place] [address]   [phone] (home)  DOB: 12/07/1945 MRN: [id number]  PRIMARY CARE PROVIDER:    Barbie Banner, MD,  [address] [place] [address] [phone]  REFERRING PROVIDER:   Barbie Banner, MD [address] [place],  [place] [address] [phone]  RESPONSIBLE PARTY:    Contact Information    Name Relation Home Work Mobile   [place] Spouse [phone]  [phone]   Dasiah, [place]   [phone]     I met face to face with patient in home. Missy nurse from [employer] present during visit. Palliative Care was asked to follow this patient by consultation request of  Barbie Banner, MD to address advance care planning and complex medical decision making. This is a follow up visit.                                  ASSESSMENT AND PLAN / RECOMMENDATIONS:   Advance Care Planning/Goals of Care:  CODE STATUS: DNR Goal of Care: Patient's goal of care is comfort while preserving function.  Directives: Pateint has a living will. Signed DNR form in home, copy on  Epic EMR.  Symptom Management/Plan: Pressure ulcers: Patient not receiving home health wound care. Wound noted to be healing well. Stage 2 ulcers almost resolved. Stage 3 wound on left buttocks is now stage 2. Continue supportive care.  Maintain skin integrity, provide prompt peri- care with incontinence episodes, continue use of barrier cream after every peri-care. Afib: Patient continues on Coumadin for anticoagulation without report of acute bleed, denied chest pain, heart rate is 59. Her last INR was 3.2 on 05/31/2020, her last visit with her PCP Mady Gemma, NP was 5 months ago. Patient is bed bound, with difficulty going to medical appointments. Patient verbalized desire to  switch to a physician that can come to her home. Patient in the past was referred to Dr. Darleene Cleaver with [facility] but patient missed her appointment as no one came to the door when the physician arrested. Patient asked that the physician can come inside her house anytime during the day as her door is always open. We will communicate this to Dr, Maceo Pro office. Patient verbalized interest in meal assistance. Patient will be referred to Palliative care social worker for assistance with meals on Wheels. Provided general support and encouragement. Palliative care will continue to provide support to patient, family and the medical team.  Follow up Palliative Care Visit: Palliative care will continue to follow for complex medical decision making, advance care planning, and clarification of goals. Return in about 4 weeks or prn.  PPS: 30%  HOSPICE ELIGIBILITY/DIAGNOSIS: TBD  CHIEF COMPLAIN: follow up on wounds on buttocks  History obtained from review of Epic EMR and discussion with Ms. Mclaine. And Missy RN.  HISTORY OF PRESENT ILLNESS:Christy B Mullinsis a 75 y.o.year old femalewith multiple medical problems including Parkinson disease, Afib on Coumadin, type 2 diabetes, HLD, HTN. Visit for follow up on wounds on patient buttocks related pressure from immobility. Wound healing well, wound on left buttocks now a stage 2 down from stage 3. Stage 2 wounds right buttocks and sacrum almost resolved. Patient denied any uncontrolled pain, denied fever or chills.  Ten systems reviewed and are negative for acute change, except as noted in the HPI.  Component Ref Range & Units 9 mo ago  (01/05/20) 9 mo ago  (01/04/20) 9 mo ago  (01/03/20) 9 mo ago  (01/02/20) 9 mo ago  (01/01/20) 6 yr ago  (05/19/14) 6 yr ago  (05/18/14)  WBC 4.0 - 10.5 K/uL 7.7  8.0  8.3  9.6  11.2High  7.6  7.6   RBC 3.87 - 5.11 MIL/uL 4.06  4.53  3.73Low  3.62Low  3.85Low  4.17  4.01   Hemoglobin 12.0 - 15.0 g/dL 12.4   13.8  11.5Low  11.4Low  12.0  12.9  12.5   HCT 36.0 - 46.0 % 39.2  44.2  35.8Low  34.1Low  35.8Low  40.0  38.8   MCV 80.0 - 100.0 fL 96.6  97.6  96.0  94.2  93.0  95.9 R  96.8 R   MCH 26.0 - 34.0 pg 30.5  30.5  30.8  31.5  31.2  30.9  31.2   MCHC 30.0 - 36.0 g/dL 31.6  31.2  32.1  33.4  33.5  32.3  32.2   RDW 11.5 - 15.5 % 14.5  14.5  14.6  14.6  14.5  13.7  13.9   Platelets 150 - 400 K/uL 268  248  220  234  268  242  259   nRBC 0.0 - 0.2 % 0.0  0.0 CM  0.0 CM  0.0  0.0      Component Ref Range & Units 9 mo ago  (01/05/20) 9 mo ago  (01/04/20) 9 mo ago  (01/03/20) 9 mo ago  (01/02/20) 9 mo ago  (01/01/20) 6 yr ago  (05/19/14) 6 yr ago  (05/18/14)  Sodium 135 - 145 mmol/L 140  138  139  143  140  137 CM  140 CM   Potassium 3.5 - 5.1 mmol/L 3.5  4.7 CM  2.9Low  3.8  3.1Low  3.7 CM  3.8 CM   Comment: DELTA CHECK NOTED  Chloride 98 - 111 mmol/L 104  105  104  107  103  103 R  105 R   CO2 22 - 32 mmol/L $RemoveB'26  25  27  27  25  28 'TprTINci$ R  29 R   Glucose, Bld 70 - 99 mg/dL 132High  132High CM  141High CM  114High CM  148High CM  133High  109High   Comment: Glucose reference range applies only to samples taken after fasting for at least 8 hours.  BUN 8 - 23 mg/dL $Remove'12  11  11  10  13  9 'ueykxqz$ R  9 R   Creatinine, Ser 0.44 - 1.00 mg/dL 0.66  0.71  0.70  0.67  0.80  0.69 R  0.76 R   Calcium 8.9 - 10.3 mg/dL 9.1  9.1  8.6Low  8.6Low  9.1  8.5 R  8.5 R   GFR calc non Af Amer >60 mL/min >60  >60  >60  >60  >60  87Low R  84Low R   GFR calc Af Amer >60 mL/min >60  >60  >60  >60  >60  >90 R, CM  >90 R, CM   Anion gap 5 - $R'15 10  8 'te$ CM  8 CM  9 CM  12 CM  6  6    I reviewed available labs, medications, imaging, studies and related documents from the EMR.  Records reviewed and summarized above.  Physical Exam: General: chronically ill and frail appearing,cooperative, lying in bed in NAD EYES: anicteric sclera,cleardrainagenoted ENMT: intact hearing, oral mucous membranes  moist CV:no edema Pulmonary: no increased work of breathing, no cough, no audible wheezes, room air, RRR Abdomen:no ascites GU: deferred WCH:JSCBIPJRPZ, foot drop noted to bilateral lower extremity, non ambulatory Skin: warm and dry, Pressure ulcer stage 2 on left buttock. Neuro: Generalized weakness,A and O x 3 Psych: non-anxious affecttoday Hem/lymph/immuno: no widespread bruising  Past Medical History:  Diagnosis Date  . Chronic atrial fibrillation ([facility])   . Depression   . Diabetes ([facility])   . Hyperlipidemia    dx circa 2008  . Hypertension    dx circa 2008  . Mitral regurgitation    mild to moderate by echo 2013  . Osteoarthritis    "L knee primarily" (05/17/2014)  . Parkinson's disease    dx circa 1990 by Dr. Doy Mince   Current Outpatient Medications on File Prior to Visit  Medication Sig Dispense Refill  . carbidopa-levodopa-entacapone (STALEVO) 31.25-125-200 MG tablet 2 tablets at 7 am, 1 at 11 am, 2 at 4 pm 450 tablet 1  . digoxin (LANOXIN) 0.125 MG tablet TAKE ONE TABLET BY MOUTH DAILY. PLEASE MAKE OVERDUE APPOINTMENT WITH DR. Radford Pax BEFORE ANYMORE REFILLS 90 tablet 3  . FLUoxetine (PROZAC) 20 MG capsule Take 20 mg by mouth daily.    . metFORMIN (GLUCOPHAGE-XR) 500 MG 24 hr tablet Take 500 mg by mouth daily.     . metoprolol tartrate (LOPRESSOR) 25 MG tablet Take 0.5 tablets (12.5 mg total) by mouth 2 (two) times daily. 30 tablet 0  . Multiple Vitamin (MULTIVITAMIN) tablet Take 1 tablet by mouth daily.    . Multiple Vitamins-Minerals (EYE VITAMINS PO) Take 2 tablets by mouth daily.     Marland Kitchen OLANZapine (ZYPREXA) 2.5 MG tablet Take 2.5 mg by mouth at bedtime.    . rosuvastatin (CRESTOR) 20 MG tablet Take 20 mg by mouth at bedtime.    Marland Kitchen warfarin (COUMADIN) 7.5 MG tablet Take 7.5 mg by mouth daily.     No current facility-administered medications on file prior to visit.   Thank you for the opportunity to participate in the care of Ms. Fann.  The palliative care  team will continue to follow. Please call our office at [phone] if we can be of additional assistance.   Jari Favre, DNP, AGPCNP-BC  COVID-19 PATIENT SCREENING TOOL Asked and negative response unless otherwise noted:   Have you had symptoms of covid, tested positive or been in contact with someone with symptoms/positive test in the past 5-10 days?

## 2020-10-11 NOTE — Telephone Encounter (Signed)
Message left at Dr. Maceo Pro office to provide update that patient/husband may have difficulty hearing someone knocking at the door and thinks they have missed a visit. Requested that provider reattempt

## 2020-10-17 ENCOUNTER — Telehealth: Payer: Self-pay

## 2020-10-17 NOTE — Telephone Encounter (Signed)
(  4:43p) SW completed a mobile meals referral for patient.

## 2020-10-17 NOTE — Telephone Encounter (Signed)
SW completed a mobile meals referral for patient.

## 2020-11-18 ENCOUNTER — Other Ambulatory Visit: Payer: Self-pay

## 2020-11-18 ENCOUNTER — Other Ambulatory Visit: Payer: Medicare Other | Admitting: Nurse Practitioner

## 2020-11-18 VITALS — BP 124/60 | HR 44 | Resp 16

## 2020-11-18 DIAGNOSIS — R251 Tremor, unspecified: Secondary | ICD-10-CM

## 2020-11-18 DIAGNOSIS — Z515 Encounter for palliative care: Secondary | ICD-10-CM

## 2020-11-18 DIAGNOSIS — Z7901 Long term (current) use of anticoagulants: Secondary | ICD-10-CM

## 2020-11-18 NOTE — Progress Notes (Addendum)
[name] Consult Note Telephone: [phone]  Fax: [phone]    Date of encounter: 11/18/20 PATIENT NAME: Christy Pham [name]   [phone] (home)  DOB: 09-Jan-1946 MRN: [id number]  PRIMARY CARE PROVIDER:    [name], MD,  [address] [place] [address] [phone]  REFERRING PROVIDER:   [name], MD [address] [place],  [place] [address] [phone]  RESPONSIBLE PARTY:    Contact Information     Name Relation Home Work Mobile   Christy Pham Spouse [phone]  [phone]   Christy, Pham   [phone]     I met face to face with patient in home. Her husband and her personal care giver were present in home during visit. Palliative Care was asked to follow this patient by consultation request of  Christy Sacramento, MD to address advance care planning and complex medical decision making. This is a follow up visit.                                  ASSESSMENT AND PLAN / RECOMMENDATIONS:   Advance Care Planning/Goals of Care:  CODE STATUS: DNR Goal of Care: Patient's goal of care is comfort while preserving function.  Directives: Pateint has a living will. Signed DNR form in home, copy on [facility] EMR. Patient reiterated desire to not be resuscitated in the event of cardiac or respiratory arrest.   Symptom Management/Plan: Tremor: Increased tremor in the context of progression of Parkinson's disease. Consider increasing dose of Sinemet, patient currently on 25-$RemoveBeforeD'100mg'bOGHnXvjLUNNKy$  twice daily. Continue supportive care. Chronic anticoagulation: Patient on Coumadin 7.$RemoveBefor'5mg'MtBkCwJSFsUK$  daily, for prevention of cardiovascular event due to Afib. Consider switching patient to other agents like Eliquis that does not require laboratory monitoring as patient is home bound. Provided general support and encouragement. Questions and concerns were addressed. Patient and her family was  encouraged to call with questions and/or concerns.  Contact number for her new PCP Christy Pham was given to patient's personal caregiver. Visit recommendations discussed with Christy Pham team member Christy Salvo, NP. He verbalized plans to follow up with patient with the recommendation.  Follow up Palliative Care Visit: Palliative care will continue to follow for complex medical decision making, advance care planning, and clarification of goals. Return as needed.  PPS: 30%  HOSPICE ELIGIBILITY/DIAGNOSIS: TBD  CHIEF COMPLAIN: increased tremors  History obtained from review of Epic EMR and discussion with  Christy Pham, patient's husband and her care giver.    HISTORY OF PRESENT ILLNESS:  Christy Pham is a 75 y.o. year old female with multiple medical problems including Parkinson disease, Afib on Coumadin, type 2 diabetes, HLD, HTN.  Patient with reports of tremors in the context of progressing parkinson's disease. Patient currently on Sinemet 25-$RemoveBefor'100mg'zAEyhiyvRuMu$  twice a day was followed by Dr. Carles Collet of [facility], was dismissed in January of this year due to missed appointments. Patient now received her primary care needs from Christy Pham team ([facility]. Patient was seen by one of his Team Member Christy Meckel, NP on 10/14/2020 for initial visit. Patient seen today lying bed in NAD, she is currently non-ambulatory. She lives at home with her wife, receives personal care giving assistance from [facility]. She denied any pain today, denied fever, denied chills, denied SOB. Spoke with Missy, nurse supervisor from [facility], she report  improvement in patient's wound. Ten systems reviewed and are negative for acute change, except as noted in the HPI.   Vitals:   11/18/20 1416  BP: 124/60  Pulse: (!) 44  Resp: 16  SpO2: 98%    Physical Exam: Current and past weights: no recent weight reported General: chronically ill and frail appearing, cooperative, lying in bed in NAD EYES:  anicteric sclera, no drainage  ENMT: intact hearing, oral mucous membranes moist CV: S1S2 normal, no edema Pulmonary: no increased work of breathing, no cough, no audible wheezes, room air Abdomen: no ascites GU: deferred MSK:  sarcopenia, foot drop noted to bilateral lower extremity, non ambulatory Skin: warm and dry Neuro: Generalized weakness, A and O x 2, mild cognitive impairment Psych: non-anxious affect today Hem/lymph/immuno: no widespread bruising  Past Medical History:  Diagnosis Date   Chronic atrial fibrillation ([facility])    Depression    Diabetes ([facility])    Hyperlipidemia    dx circa 2008   Hypertension    dx circa 2008   Mitral regurgitation    mild to moderate by echo 2013   Osteoarthritis    "L knee primarily" (05/17/2014)   Parkinson's disease    dx circa 1990 by Dr. Doy Pham    I spent 50 minutes providing this consultation. More than 50% of the time in this consultation was spent in counseling and care coordination.  Thank you for the opportunity to participate in the care of [name].  The palliative care team will continue to follow. Please call our office at [phone] if we can be of additional assistance.   Christy Favre, DNP, AGPCNP-BC  COVID-19 PATIENT SCREENING TOOL Asked and negative response unless otherwise noted:   Have you had symptoms of covid, tested positive or been in contact with someone with symptoms/positive test in the past 5-10 days?

## 2020-11-19 IMAGING — RF DG SWALLOWING FUNCTION
12 of 19 series · 12 of 24 positions shown · non-contrast
Comparison: None.

CLINICAL DATA: Tremor.  Assessment for dietary upgrade.  Dysphagia.

EXAM:
MODIFIED BARIUM SWALLOW
TECHNIQUE: Different consistencies of barium were administered orally to the
patient by the Speech Pathologist. Imaging of the pharynx was
performed in the lateral projection. The radiologist was present in
the fluoroscopy room for this study, providing personal supervision.
FLUOROSCOPY TIME:  Fluoroscopy Time:  1 minutes, 13 seconds
Radiation Exposure Index (if provided by the fluoroscopic device):
18.3 mGy
Number of Acquired Spot Images: 0

[Series 1: run · 1 of 24 frames shown (1 of 12)]
[frame 21/24]
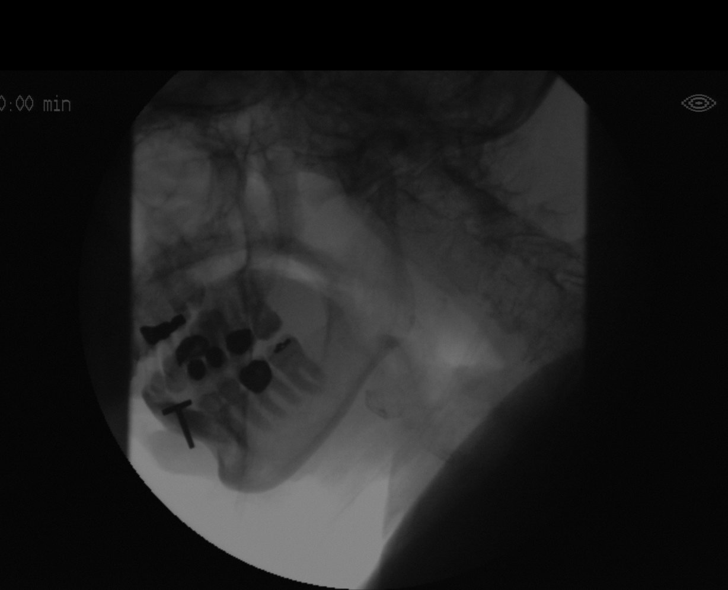

[Series 3: run · 1 of 82 frames shown (2 of 12)]
[frame 42/82]
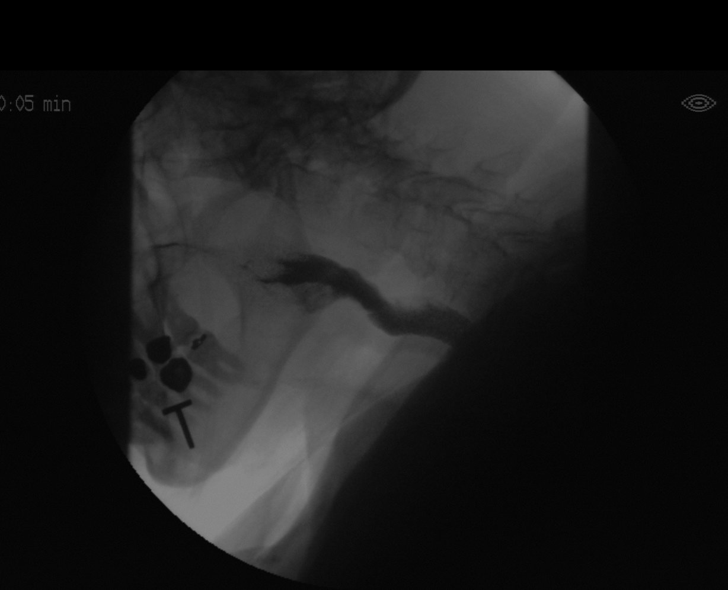

[Series 5: run · 1 of 104 frames shown (3 of 12)]
[frame 1/104]
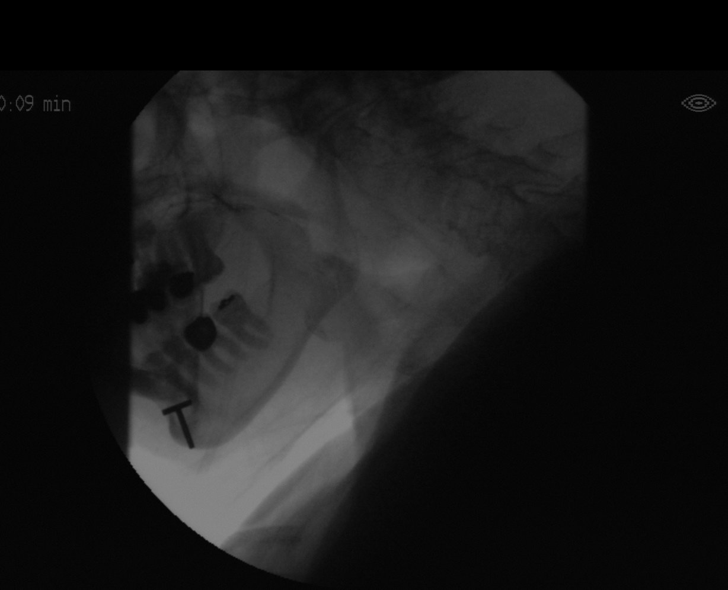

[Series 6: run · 1 of 23 frames shown (4 of 12)]
[frame 20/23]
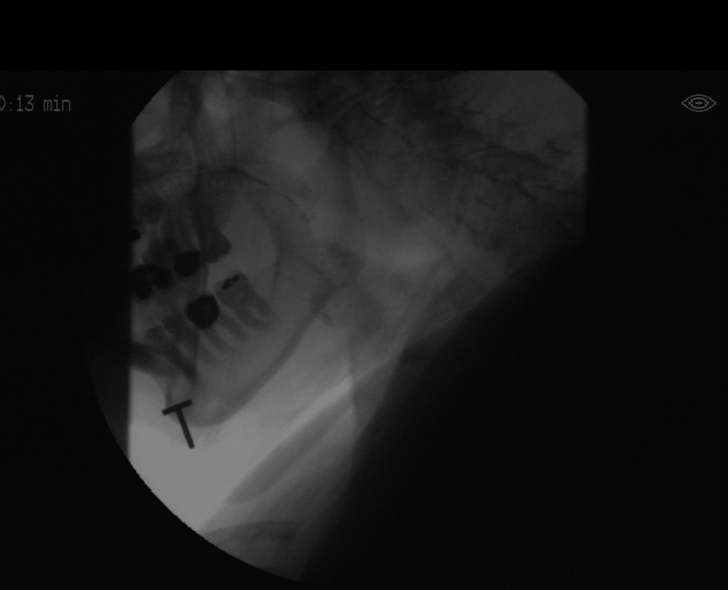

[Series 8: run · 1 of 291 frames shown (5 of 12)]
[frame 44/291]
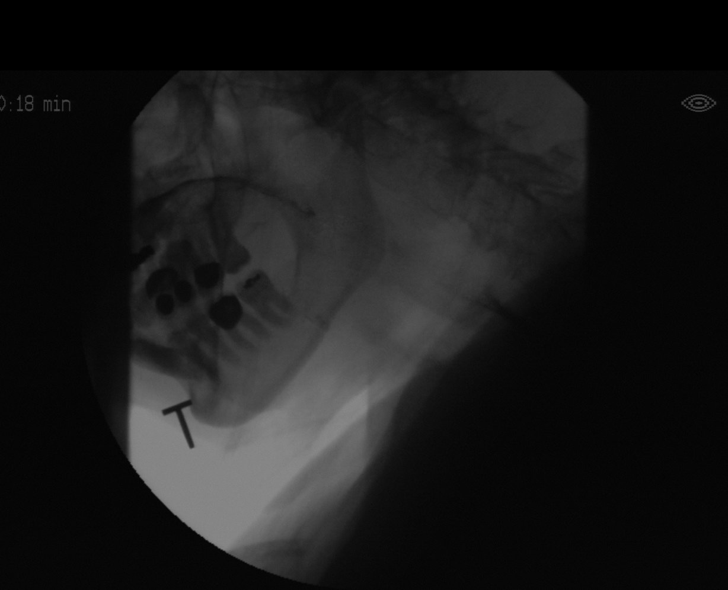

[Series 10: run · 1 of 108 frames shown (6 of 12)]
[frame 17/108]
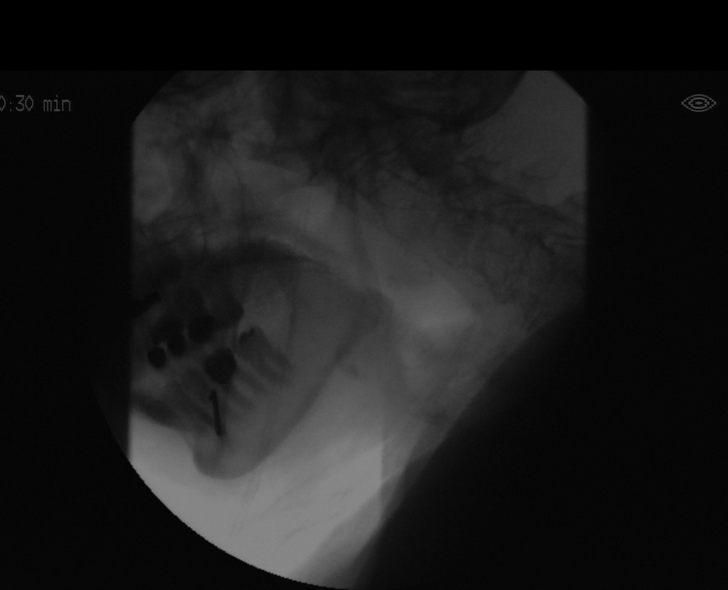

[Series 11: run · 1 of 110 frames shown (7 of 12)]
[frame 56/110]
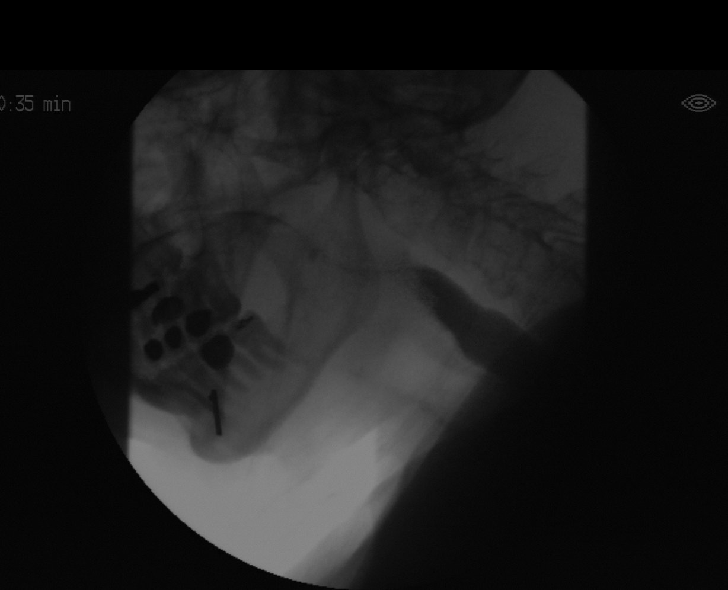

[Series 13: run · 1 of 284 frames shown (8 of 12)]
[frame 143/284]
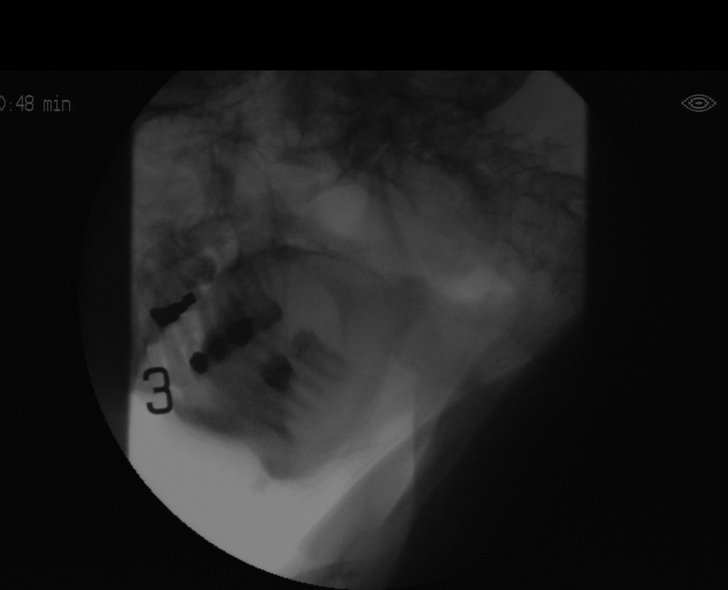

[Series 14: run · 1 of 280 frames shown (9 of 12)]
[frame 239/280]
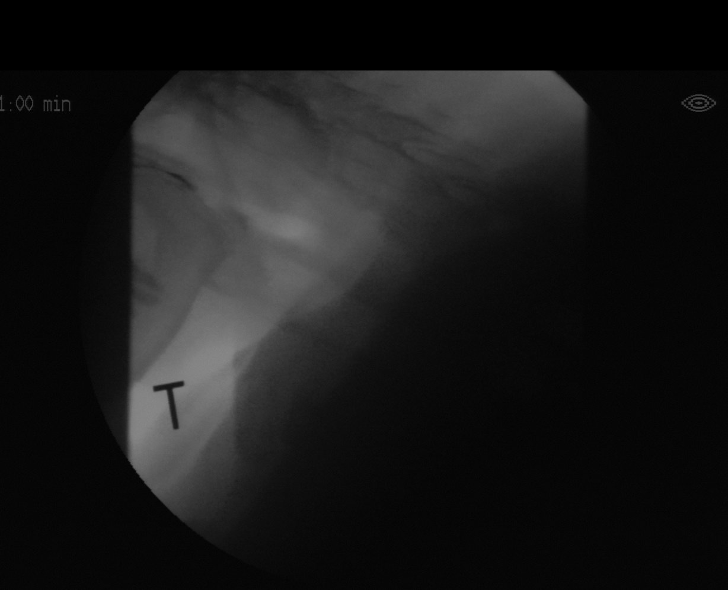

[Series 16: run · 1 of 20 frames shown (10 of 12)]
[frame 11/20]
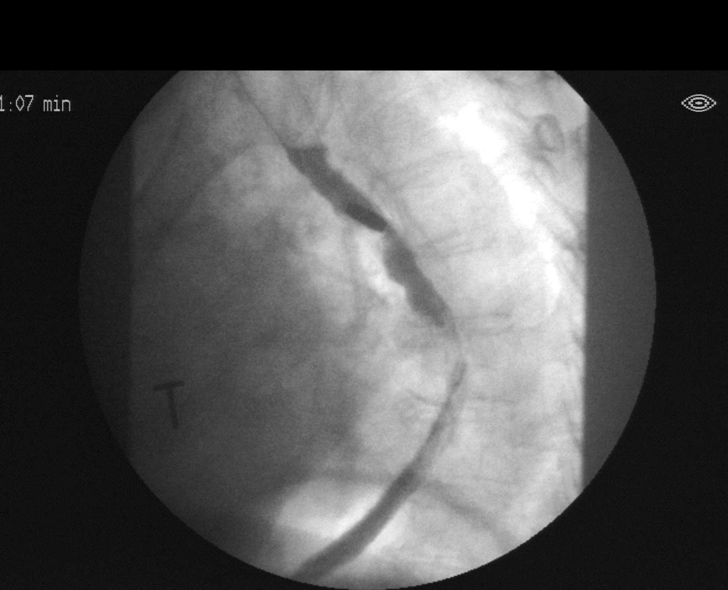

[Series 18: run · 1 of 108 frames shown (11 of 12)]
[frame 17/108]
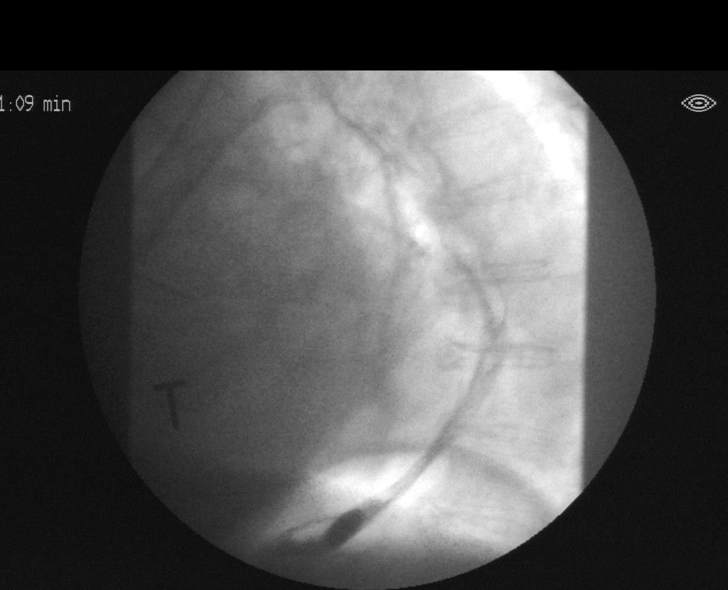

[Series 19: run · 1 of 30 frames shown (12 of 12)]
[frame 26/30]
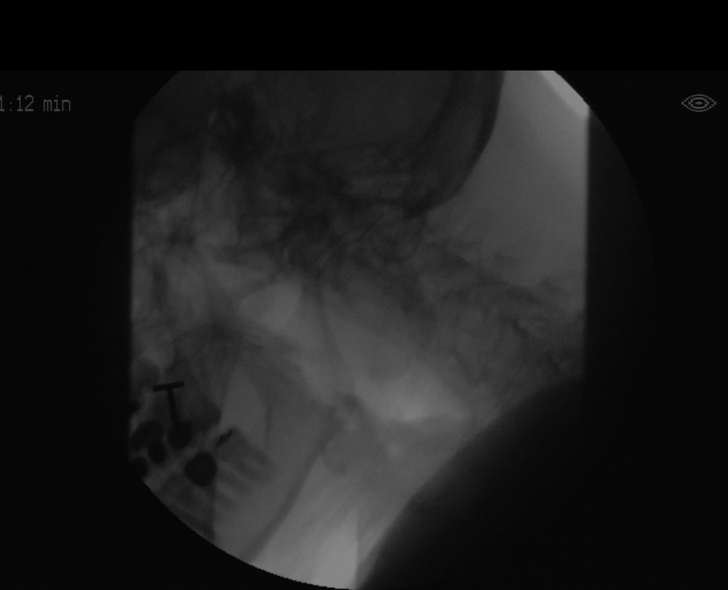

[12 of 24 positions shown; findings below may reference images not displayed]

FINDINGS: Thin liquid: Unremarkable

Puree consistency: Unremarkable

Polania cracker with puree consistency: Unremarkable
IMPRESSION: 1. No significant abnormality observed

Please refer to the Speech Pathologists report for complete details
and recommendations.

## 2020-12-13 ENCOUNTER — Telehealth: Payer: Self-pay

## 2020-12-13 NOTE — Telephone Encounter (Signed)
Received message that Missy with [facility] called to report that wounds are worsening. Missy made aware that PCP needs to be notified for evaluation and home health. Phone call placed to Dr. Harlow Mares office to provide update.

## 2021-01-20 ENCOUNTER — Telehealth: Payer: Self-pay

## 2021-01-22 NOTE — Telephone Encounter (Signed)
NA

## 2021-01-24 ENCOUNTER — Other Ambulatory Visit: Payer: Self-pay

## 2021-01-24 ENCOUNTER — Other Ambulatory Visit: Payer: Medicare Other

## 2021-01-24 ENCOUNTER — Other Ambulatory Visit: Payer: Medicare Other | Admitting: *Deleted

## 2021-01-24 VITALS — BP 164/96 | HR 70 | Temp 97.5°F | Resp 16

## 2021-01-24 DIAGNOSIS — Z515 Encounter for palliative care: Secondary | ICD-10-CM

## 2021-01-24 NOTE — Progress Notes (Signed)
[facility] PALLIATIVE CARE SW NOTE  PATIENT NAME: Christy Pham DOB: June 30, 1945 MRN: [id number]  PRIMARY CARE PROVIDER: Barbie Banner, MD  RESPONSIBLE PARTY:  Acct ID - Guarantor Home Phone Work Phone Relationship Acct Type  [id number] VERMELL, MADRID* [phone]  Self P/F     [address], [place], [place] [address]   Initial COVID-19 Screening: Negative  PLAN OF CARE and INTERVENTIONS:             GOALS OF CARE/ ADVANCE CARE PLANNING:  Goal is for patient to remain in home with her husband. Patient is a DNR.  SOCIAL/EMOTIONAL/SPIRITUAL ASSESSMENT/ INTERVENTIONS:  SW and RN- M. Dimas Aguas completed a follow-up/check-in visit with patient at her home. She was present with her husband and private caregiver-Tiffany. Patient was in bed. She was awake, alert and engaged. However due to progression of parkinson's disease, her verbalizations are delayed and she has difficulty finding words to complete sentences. She denied pain. Patient was able to share some background information on herself. The palliative care RN completed an assessment of patient. The teams assessed patient and caregiver needs/concerns. Patient/caregiver requested a podiatry consult as her toenails needed to be cut. RN assessed wound on patient's bottom that has healed and the private caregiver was encouraged to continue current personal care regiment that includes application of barrier cream and ointment. SW provided supportive presence and life review as patient shared some social history. Patient reported that she has hallucinations where she see [employer] and little girls.The patient reported that she is not fearful of these sightings, but they "un-nerved" her. Patient was very cordial and engaged. She was open to ongoing palliative care/SW visits. Her husband's  needs and concerns were also addressed-no concerns noted.  PATIENT/CAREGIVER EDUCATION/ COPING:  Patient appears to be coping well.  PERSONAL EMERGENCY  PLAN:  911 can be accessed for emergencies.  COMMUNITY RESOURCES COORDINATION/ HEALTH CARE NAVIGATION:  Patient have private caregivers through [facility]. FINANCIAL/LEGAL CONCERNS/INTERVENTIONS:  None.     SOCIAL HX:  Social History   Tobacco Use   Smoking status: Never   Smokeless tobacco: Never  Substance Use Topics   Alcohol use: Yes    Alcohol/week: 3.0 standard drinks    Types: 3 Glasses of wine per week    Comment: 3 times per week (wine)    CODE STATUS: DNR ADVANCED DIRECTIVES: No MOST FORM COMPLETE:  No HOSPICE EDUCATION PROVIDED: No  PPS: Patient is alert and oriented to self. She is dependent for personal care needs due to Parkinson's.  Duration of visit and documentation: 60 minutes.  [address] [place], [place]

## 2021-02-06 NOTE — Progress Notes (Signed)
[facility] PALLIATIVE CARE RN NOTE  PATIENT NAME: Christy Pham DOB: [birth year] MRN: [id number]  PRIMARY CARE PROVIDER: [name], MD  RESPONSIBLE PARTY:  Acct ID - Guarantor Home Phone Work Phone Relationship Acct Type  [id number] Christy Pham, Christy Pham* [phone]  Self P/F     [place], [place], [place] [address]   Covid-19 Pre-screening Negative  PLAN OF CARE and INTERVENTION:  ADVANCE CARE PLANNING/GOALS OF CARE: Goal is for patient to remain in the home with her husband with hired caregivers. PATIENT/CAREGIVER EDUCATION: Symptom management, management of skin breakdown DISEASE STATUS: Joint palliative care visit completed with LCSW, M. Lonon. Met with patient and hired caregiver from [facility] in the home. Hired sitters are present with patient 6 hours/day. Patient is alert and oriented x 4. Pleasant mood and engaging. Visit requested  to assess patient's bottom. She has a small stage 2 noted to left buttocks. No redness, irritation or inflammation noted to surrounding area. Caregiver reports that 2 weeks ago it was much worse. Area appears to be healing. They are currently using Balmex spray cleaner, A&D ointment and keeping the area clean and dry. Patient is incontinent of both bowel and bladder and is bed bound. I recommended to caregiver to continue current treatment as it seems to be helping. They also have questions regarding Podiatry. I spoke with Glean Salvo NP with Dr. Altamese Dilling office who reports that he has already sent a referral for Podiatry. I provided him with an update regarding patient's skin. Will continue to monitor.  HISTORY OF PRESENT ILLNESS:  This is a 75 yo female with a diagnosis of Parkinsonism. She has a history of Afib w/RVR, hypertension, mitral regurgitation and hyperlipidemia. Palliative care team continues to follow patient for symptom management, goals of care and complex decision making.  CODE STATUS: DNR ADVANCED DIRECTIVES: Y MOST  FORM: no PPS: 30%   PHYSICAL EXAM:   VITALS: Today's Vitals   01/24/21 0949  BP: (!) 164/96  Pulse: 70  Resp: 16  Temp: (!) 97.5 F (36.4 C)  TempSrc: Temporal  SpO2: 98%  PainSc: 0-No pain    LUNGS: clear to auscultation  CARDIAC: Cor RRR EXTREMITIES: No edema SKIN:  See above note   NEURO:  Alert and oriented x 4, pleasant mood, generalized weakness, non-ambulatory    (Duration of visit and documentation 60 minutes)   Daryl Eastern, RN BSN

## 2021-04-15 ENCOUNTER — Other Ambulatory Visit: Payer: Self-pay | Admitting: Cardiology

## 2021-05-22 ENCOUNTER — Other Ambulatory Visit: Payer: Self-pay | Admitting: Cardiology

## 2021-06-04 ENCOUNTER — Other Ambulatory Visit: Payer: Self-pay | Admitting: Cardiology

## 2021-06-25 ENCOUNTER — Other Ambulatory Visit: Payer: Self-pay | Admitting: Cardiology

## 2021-09-19 ENCOUNTER — Telehealth: Payer: Self-pay

## 2021-09-19 NOTE — Telephone Encounter (Signed)
PC check in call, no answer ?

## 2021-10-04 ENCOUNTER — Telehealth: Payer: Self-pay

## 2021-10-04 NOTE — Telephone Encounter (Signed)
(  1:00 pm) PC SW left a message for patient's son-Frank requesting a call back to discuss patient status. Other numbers on chart were called, but SW was unable to leave a message.  ?

## 2021-10-10 ENCOUNTER — Other Ambulatory Visit: Payer: Self-pay | Admitting: Cardiology

## 2021-10-18 ENCOUNTER — Other Ambulatory Visit: Payer: Medicare Other

## 2021-10-18 DIAGNOSIS — Z515 Encounter for palliative care: Secondary | ICD-10-CM

## 2021-10-18 NOTE — Progress Notes (Signed)
[facility] PALLIATIVE CARE SW NOTE  PATIENT NAME: Christy Pham DOB: 14-Jan-1946 MRN: [id number]  PRIMARY CARE PROVIDER: [name], MD  RESPONSIBLE PARTY:  Acct ID - Guarantor Home Phone Work Phone Relationship Acct Type  [id number] ARIYON, [id number]  Self P/F     [address], [place], [place] [address]   SOCIAL WORK TELEPHONIC ENCOUNTER   PC SW completed a telephonic encounter with patient to assess her needs, comfort and status. Patient reported that her breathing is stable. Her appetite remains good. She is taking meds without any issues. She weight has been stable. Patient has in-home care through [facility] for 8 hours a day. Patient reported that she would like to see the NP for a follow-up visit. SW advised that she will have someone call to schedule a visit with the NP. No other concerns noted.    [address] [place], [place]

## 2021-10-30 ENCOUNTER — Telehealth: Payer: Self-pay

## 2021-10-30 NOTE — Telephone Encounter (Signed)
Volunteer check in call made for palliative care, doing ok right now

## 2021-11-08 ENCOUNTER — Other Ambulatory Visit: Payer: Self-pay | Admitting: Cardiology

## 2022-01-20 ENCOUNTER — Emergency Department (HOSPITAL_COMMUNITY): Payer: Medicare Other

## 2022-01-20 ENCOUNTER — Other Ambulatory Visit: Payer: Self-pay

## 2022-01-20 ENCOUNTER — Emergency Department (HOSPITAL_COMMUNITY)
Admission: EM | Admit: 2022-01-20 | Discharge: 2022-01-20 | Disposition: A | Discharge status: Home / Self Care | Payer: Medicare Other | Attending: Emergency Medicine | Admitting: Emergency Medicine

## 2022-01-20 DIAGNOSIS — E119 Type 2 diabetes mellitus without complications: Secondary | ICD-10-CM | POA: Diagnosis not present

## 2022-01-20 DIAGNOSIS — I482 Chronic atrial fibrillation, unspecified: Secondary | ICD-10-CM | POA: Insufficient documentation

## 2022-01-20 DIAGNOSIS — U071 COVID-19: Secondary | ICD-10-CM | POA: Insufficient documentation

## 2022-01-20 DIAGNOSIS — I10 Essential (primary) hypertension: Secondary | ICD-10-CM | POA: Diagnosis not present

## 2022-01-20 DIAGNOSIS — R509 Fever, unspecified: Secondary | ICD-10-CM | POA: Diagnosis present

## 2022-01-20 DIAGNOSIS — G20 Parkinson's disease: Secondary | ICD-10-CM | POA: Insufficient documentation

## 2022-01-20 LAB — URINALYSIS, ROUTINE W REFLEX MICROSCOPIC
Bilirubin Urine: NEGATIVE
Glucose, UA: NEGATIVE mg/dL
Ketones, ur: 5 mg/dL — AB
Nitrite: NEGATIVE
Protein, ur: NEGATIVE mg/dL
Specific Gravity, Urine: 1.02 (ref 1.005–1.030)
pH: 5 (ref 5.0–8.0)

## 2022-01-20 LAB — COMPREHENSIVE METABOLIC PANEL
ALT: 14 U/L (ref 0–44)
AST: 21 U/L (ref 15–41)
Albumin: 3.8 g/dL (ref 3.5–5.0)
Alkaline Phosphatase: 56 U/L (ref 38–126)
Anion gap: 9 (ref 5–15)
BUN: 16 mg/dL (ref 8–23)
CO2: 27 mmol/L (ref 22–32)
Calcium: 9.2 mg/dL (ref 8.9–10.3)
Chloride: 106 mmol/L (ref 98–111)
Creatinine, Ser: 0.71 mg/dL (ref 0.44–1.00)
GFR, Estimated: >60 mL/min (ref >60)
Glucose, Bld: 111 mg/dL — ABNORMAL HIGH (ref 70–99)
Potassium: 3.4 mmol/L — ABNORMAL LOW (ref 3.5–5.1)
Sodium: 142 mmol/L (ref 135–145)
Total Bilirubin: 0.9 mg/dL (ref 0.3–1.2)
Total Protein: 6.9 g/dL (ref 6.5–8.1)

## 2022-01-20 LAB — CBC WITH DIFFERENTIAL/PLATELET
Abs Immature Granulocytes: 0.02 10*3/uL (ref 0.00–0.07)
Basophils Absolute: 0.1 10*3/uL (ref 0.0–0.1)
Basophils Relative: 1 %
Eosinophils Absolute: 0.1 10*3/uL (ref 0.0–0.5)
Eosinophils Relative: 2 %
HCT: 40 % (ref 36.0–46.0)
Hemoglobin: 12.9 g/dL (ref 12.0–15.0)
Immature Granulocytes: 0 %
Lymphocytes Relative: 10 %
Lymphs Abs: 0.6 10*3/uL — ABNORMAL LOW (ref 0.7–4.0)
MCH: 30 pg (ref 26.0–34.0)
MCHC: 32.3 g/dL (ref 30.0–36.0)
MCV: 93 fL (ref 80.0–100.0)
Monocytes Absolute: 0.4 10*3/uL (ref 0.1–1.0)
Monocytes Relative: 7 %
Neutro Abs: 5.2 10*3/uL (ref 1.7–7.7)
Neutrophils Relative %: 80 %
Platelets: 235 10*3/uL (ref 150–400)
RBC: 4.3 MIL/uL (ref 3.87–5.11)
RDW: 13.7 % (ref 11.5–15.5)
WBC: 6.5 10*3/uL (ref 4.0–10.5)
nRBC: 0 % (ref 0.0–0.2)

## 2022-01-20 LAB — PROTIME-INR
INR: 1.1 (ref 0.8–1.2)
Prothrombin Time: 13.9 seconds (ref 11.4–15.2)

## 2022-01-20 LAB — DIGOXIN LEVEL: Digoxin Level: <0.2 ng/mL — ABNORMAL LOW (ref 0.8–2.0)

## 2022-01-20 LAB — LACTIC ACID, PLASMA: Lactic Acid, Venous: 1.5 mmol/L (ref 0.5–1.9)

## 2022-01-20 LAB — SARS CORONAVIRUS 2 BY RT PCR: SARS Coronavirus 2 by RT PCR: POSITIVE — AB

## 2022-01-20 MED ORDER — LACTATED RINGERS IV BOLUS
1000.0000 mL | Freq: Once | INTRAVENOUS | Status: AC
  Administered 2022-01-20: 1000 mL via INTRAVENOUS

## 2022-01-20 MED ORDER — MOLNUPIRAVIR 200 MG PO CAPS: 4.0000 | ORAL_CAPSULE | Freq: Two times a day (BID) | ORAL | 0 refills | Status: AC

## 2022-01-20 MED ORDER — ACETAMINOPHEN 500 MG PO TABS
1000.0000 mg | ORAL_TABLET | Freq: Once | ORAL | Status: AC
  Administered 2022-01-20: 1000 mg via ORAL
  Filled 2022-01-20: qty 2

## 2022-01-20 NOTE — ED Triage Notes (Signed)
Patient brought from home for not feeling well, according to EMS they could not find any complaints for the patient. Upon arrival, oral temperature was taken and was 101.1.Patient denies any other symptoms, urinary, respiratory, GI, denies all.

## 2022-01-20 NOTE — ED Notes (Signed)
Discharge instructions discussed with husband. He went on home to get ready for PTAR to bring wife home. Wife is currently sleeping n NAD.

## 2022-01-20 NOTE — ED Provider Notes (Signed)
[facility]-EMERGENCY DEPT Provider Note   CSN: [id number] Arrival date & time: 01/20/22  1649     History {Add pertinent medical, surgical, social history, OB history to HPI:1} Chief Complaint  Patient presents with   Fever    Christy Pham is a 76 y.o. female.  HPI     Home Medications Prior to Admission medications   Medication Sig Start Date End Date Taking? Authorizing Provider  carbidopa-levodopa-entacapone (STALEVO) 31.25-125-200 MG tablet 2 tablets at 7 am, 1 at 11 am, 2 at 4 pm 02/13/18   Tat, Octaviano Batty, DO  digoxin (LANOXIN) 0.125 MG tablet Take 1 tablet (0.125 mg total) by mouth daily. 10/10/21   Quintella Reichert, MD  FLUoxetine (PROZAC) 20 MG capsule Take 20 mg by mouth daily. 12/10/19   [provider]  metFORMIN (GLUCOPHAGE-XR) 500 MG 24 hr tablet Take 500 mg by mouth daily.  06/17/17   [provider]  metoprolol tartrate (LOPRESSOR) 25 MG tablet Take 0.5 tablets (12.5 mg total) by mouth 2 (two) times daily. 05/19/14   Elgergawy, Leana Roe, MD  Multiple Vitamin (MULTIVITAMIN) tablet Take 1 tablet by mouth daily.    [provider]  Multiple Vitamins-Minerals (EYE VITAMINS PO) Take 2 tablets by mouth daily.     [provider]  OLANZapine (ZYPREXA) 2.5 MG tablet Take 2.5 mg by mouth at bedtime.    [provider]  rosuvastatin (CRESTOR) 20 MG tablet Take 20 mg by mouth at bedtime. 12/31/19   [provider]  warfarin (COUMADIN) 7.5 MG tablet Take 7.5 mg by mouth daily. 12/01/19   [provider]      Allergies    Patient has no known allergies.    Review of Systems   Review of Systems  Physical Exam Updated Vital Signs BP (!) 141/71   Pulse (!) 102   Temp (!) 101.8 F (38.8 C) (Rectal)   Resp (!) 34   SpO2 98%  Physical Exam  ED Results / Procedures / Treatments   Labs (all labs ordered are listed, but only abnormal results are displayed) Labs Reviewed  CBC WITH  DIFFERENTIAL/PLATELET - Abnormal; Notable for the following components:      Result Value   Lymphs Abs 0.6 (*)    All other components within normal limits  COMPREHENSIVE METABOLIC PANEL - Abnormal; Notable for the following components:   Potassium 3.4 (*)    Glucose, Bld 111 (*)    All other components within normal limits  CULTURE, BLOOD (ROUTINE X 2)  CULTURE, BLOOD (ROUTINE X 2)  URINE CULTURE  SARS CORONAVIRUS 2 BY RT PCR  LACTIC ACID, PLASMA  PROTIME-INR  LACTIC ACID, PLASMA  URINALYSIS, ROUTINE W REFLEX MICROSCOPIC    EKG EKG Interpretation  Date/Time:  Saturday January 20 2022 18:03:05 EDT Ventricular Rate:  106 PR Interval:    QRS Duration: 140 QT Interval:  338 QTC Calculation: 416 R Axis:   81 Text Interpretation: Atrial flutter Paired ventricular premature complexes Nonspecific intraventricular conduction delay Repol abnrm suggests ischemia, lateral leads Artifact in lead(s) I II aVR aVL No significant change since last tracing Confirmed by Alvira Monday ([id number]) on 01/20/2022 6:48:52 PM  Radiology DG Chest Portable 1 View  Result Date: 01/20/2022 CLINICAL DATA:  Fever.  Malaise. EXAM: PORTABLE CHEST 1 VIEW COMPARISON:  01/01/2020 FINDINGS: Enlarged cardiac silhouette with an interval increase in size. Tortuous and partially calcified thoracic aorta. Clear lungs with normal vascularity. Thoracic spine degenerative changes. Diffuse osteopenia. IMPRESSION:  Cardiomegaly.  Clear lungs. Electronically Signed   By: Beckie Salts M.D.   On: 01/20/2022 18:31    Procedures Procedures  {Document cardiac monitor, telemetry assessment procedure when appropriate:1}  Medications Ordered in ED Medications  lactated ringers bolus 1,000 mL (1,000 mLs Intravenous New Bag/Given 01/20/22 1840)    ED Course/ Medical Decision Making/ A&P                           Medical Decision Making Amount and/or Complexity of Data Reviewed Labs: ordered. Radiology:  ordered.   ***  {Document critical care time when appropriate:1} {Document review of labs and clinical decision tools ie heart score, Chads2Vasc2 etc:1}  {Document your independent review of radiology images, and any outside records:1} {Document your discussion with family members, caretakers, and with consultants:1} {Document social determinants of health affecting pt's care:1} {Document your decision making why or why not admission, treatments were needed:1} Final Clinical Impression(s) / ED Diagnoses Final diagnoses:  None    Rx / DC Orders ED Discharge Orders     None

## 2022-01-22 LAB — URINE CULTURE

## 2022-01-25 LAB — CULTURE, BLOOD (ROUTINE X 2): Culture: NO GROWTH

## 2022-12-16 ENCOUNTER — Other Ambulatory Visit: Payer: Self-pay

## 2022-12-16 ENCOUNTER — Encounter (HOSPITAL_COMMUNITY): Payer: Self-pay | Admitting: Emergency Medicine

## 2022-12-16 ENCOUNTER — Emergency Department (HOSPITAL_COMMUNITY)
Admission: EM | Admit: 2022-12-16 | Discharge: 2022-12-16 | Disposition: A | Discharge status: Home / Self Care | Payer: Medicare Other | Source: Home / Self Care

## 2022-12-16 DIAGNOSIS — G20A1 Parkinson's disease without dyskinesia, without mention of fluctuations: Secondary | ICD-10-CM | POA: Diagnosis not present

## 2022-12-16 DIAGNOSIS — R791 Abnormal coagulation profile: Secondary | ICD-10-CM | POA: Insufficient documentation

## 2022-12-16 DIAGNOSIS — Z7901 Long term (current) use of anticoagulants: Secondary | ICD-10-CM | POA: Diagnosis not present

## 2022-12-16 DIAGNOSIS — N390 Urinary tract infection, site not specified: Secondary | ICD-10-CM | POA: Diagnosis present

## 2022-12-16 DIAGNOSIS — N3001 Acute cystitis with hematuria: Secondary | ICD-10-CM | POA: Diagnosis not present

## 2022-12-16 DIAGNOSIS — L89152 Pressure ulcer of sacral region, stage 2: Secondary | ICD-10-CM | POA: Diagnosis not present

## 2022-12-16 LAB — URINALYSIS, ROUTINE W REFLEX MICROSCOPIC
Bilirubin Urine: NEGATIVE
Glucose, UA: NEGATIVE mg/dL
Ketones, ur: 5 mg/dL — AB
Nitrite: NEGATIVE
Protein, ur: NEGATIVE mg/dL
Specific Gravity, Urine: 1.013 (ref 1.005–1.030)
pH: 6 (ref 5.0–8.0)

## 2022-12-16 LAB — CBC WITH DIFFERENTIAL/PLATELET
Abs Immature Granulocytes: 0.02 K/uL (ref 0.00–0.07)
Basophils Absolute: 0.1 K/uL (ref 0.0–0.1)
Basophils Relative: 1 %
Eosinophils Absolute: 0.2 K/uL (ref 0.0–0.5)
Eosinophils Relative: 2 %
HCT: 42.7 % (ref 36.0–46.0)
Hemoglobin: 13.9 g/dL (ref 12.0–15.0)
Immature Granulocytes: 0 %
Lymphocytes Relative: 24 %
Lymphs Abs: 2.4 K/uL (ref 0.7–4.0)
MCH: 29.8 pg (ref 26.0–34.0)
MCHC: 32.6 g/dL (ref 30.0–36.0)
MCV: 91.6 fL (ref 80.0–100.0)
Monocytes Absolute: 0.7 K/uL (ref 0.1–1.0)
Monocytes Relative: 6 %
Neutro Abs: 6.9 K/uL (ref 1.7–7.7)
Neutrophils Relative %: 67 %
Platelets: 247 K/uL (ref 150–400)
RBC: 4.66 MIL/uL (ref 3.87–5.11)
RDW: 12.8 % (ref 11.5–15.5)
WBC: 10.3 K/uL (ref 4.0–10.5)
nRBC: 0 % (ref 0.0–0.2)

## 2022-12-16 LAB — BASIC METABOLIC PANEL
Anion gap: 10 (ref 5–15)
BUN: 15 mg/dL (ref 8–23)
CO2: 24 mmol/L (ref 22–32)
Calcium: 9 mg/dL (ref 8.9–10.3)
Chloride: 103 mmol/L (ref 98–111)
Creatinine, Ser: 0.63 mg/dL (ref 0.44–1.00)
GFR, Estimated: >60 mL/min (ref >60)
Glucose, Bld: 130 mg/dL — ABNORMAL HIGH (ref 70–99)
Potassium: 3.5 mmol/L (ref 3.5–5.1)
Sodium: 137 mmol/L (ref 135–145)

## 2022-12-16 LAB — PROTIME-INR
INR: 1.6 — ABNORMAL HIGH (ref 0.8–1.2)
Prothrombin Time: 18.8 s — ABNORMAL HIGH (ref 11.4–15.2)

## 2022-12-16 MED ORDER — CEPHALEXIN 500 MG PO CAPS: 500.0000 mg | ORAL_CAPSULE | Freq: Two times a day (BID) | ORAL | 0 refills | Status: DC

## 2022-12-16 MED ORDER — CEPHALEXIN 500 MG PO CAPS: 500.0000 mg | ORAL_CAPSULE | Freq: Two times a day (BID) | ORAL | 0 refills | Status: AC

## 2022-12-16 NOTE — ED Triage Notes (Signed)
Pt arrives via EMS from home with reports of UTI diagnosis a week ago but has not started abx. Pt is unsure due to being bedbound and says home health does medications. Health aid noticed blood in urine or vaginal area today.

## 2022-12-16 NOTE — ED Notes (Signed)
[facility] called at  this time. 

## 2022-12-16 NOTE — ED Provider Notes (Signed)
[place] EMERGENCY DEPARTMENT AT [facility] Provider Note   CSN: [id number] Arrival date & time: 12/16/22  1240     History  Chief Complaint  Patient presents with   Urinary Tract Infection    Christy Pham is a 77 y.o. female.  77 y/o history of Parkinson's, chronic debility presented to the emergency department with reported hematuria and untreated UTI.  Patient poor historian, husband at bedside also not quite sure why patient is here.  Patient states she has some pain in her low back/sacral area, but otherwise feels her normal self and that she often has pain in that area.   Urinary Tract Infection      Home Medications Prior to Admission medications   Medication Sig Start Date End Date Taking? Authorizing Provider  cephALEXin (KEFLEX) 500 MG capsule Take 1 capsule (500 mg total) by mouth 2 (two) times daily for 7 days. 12/16/22 12/23/22 Yes Janeliz Prestwood, Harmon Dun, DO  carbidopa-levodopa (SINEMET IR) 25-100 MG tablet Take 2 tablets by mouth 2 (two) times daily. 01/09/22   [provider]  carbidopa-levodopa-entacapone (STALEVO) 31.25-125-200 MG tablet 2 tablets at 7 am, 1 at 11 am, 2 at 4 pm 02/13/18   Tat, Octaviano Batty, DO  digoxin (LANOXIN) 0.125 MG tablet Take 1 tablet (0.125 mg total) by mouth daily. 10/10/21   Quintella Reichert, MD  FLUoxetine (PROZAC) 20 MG capsule Take 20 mg by mouth daily. 12/10/19   [provider]  metFORMIN (GLUCOPHAGE-XR) 500 MG 24 hr tablet Take 500 mg by mouth daily.  06/17/17   [provider]  metoprolol tartrate (LOPRESSOR) 25 MG tablet Take 0.5 tablets (12.5 mg total) by mouth 2 (two) times daily. 05/19/14   Elgergawy, Leana Roe, MD  Multiple Vitamin (MULTIVITAMIN) tablet Take 1 tablet by mouth daily.    [provider]  Multiple Vitamins-Minerals (EYE VITAMINS PO) Take 2 tablets by mouth daily.     [provider]  OLANZapine (ZYPREXA) 2.5 MG tablet Take 2.5 mg by mouth at bedtime.    [provider]  rosuvastatin (CRESTOR) 20 MG tablet Take 20 mg by mouth at bedtime. 12/31/19   [provider]  warfarin (COUMADIN) 7.5 MG tablet Take 7.5 mg by mouth daily. 12/01/19   [provider]      Allergies    Patient has no known allergies.    Review of Systems   Review of Systems  Physical Exam Updated Vital Signs BP (!) 154/64   Pulse 61   Temp 98.6 F (37 C) (Oral)   Resp 18   Ht 5\' 4"  (1.626 m)   Wt 82 kg   SpO2 100%   BMI 31.03 kg/m  Physical Exam Vitals and nursing note reviewed. Exam conducted with a chaperone present.  Constitutional:      General: She is not in acute distress.    Appearance: She is not ill-appearing or toxic-appearing.  HENT:     Head: Normocephalic and atraumatic.     Nose: Nose normal.     Mouth/Throat:     Mouth: Mucous membranes are moist.  Eyes:     Conjunctiva/sclera: Conjunctivae normal.  Cardiovascular:     Rate and Rhythm: Normal rate and regular rhythm.  Pulmonary:     Effort: Pulmonary effort is normal.     Breath sounds: Normal breath sounds.  Abdominal:     General: Abdomen is flat. There is no distension.     Tenderness: There is no abdominal tenderness.  There is no guarding or rebound.  Genitourinary:    General: Normal vulva.     Comments: Small quarter sized stage II-III sacral decubitus.  No surrounding erythema or purulence noted. Skin:    General: Skin is warm and dry.     Capillary Refill: Capillary refill takes less than 2 seconds.  Neurological:     Mental Status: She is alert. Mental status is at baseline.  Psychiatric:        Mood and Affect: Mood normal.        Behavior: Behavior normal.     ED Results / Procedures / Treatments   Labs (all labs ordered are listed, but only abnormal results are displayed) Labs Reviewed  BASIC METABOLIC PANEL - Abnormal; Notable for the following components:      Result Value   Glucose, Bld 130 (*)    All other components within normal limits   URINALYSIS, ROUTINE W REFLEX MICROSCOPIC - Abnormal; Notable for the following components:   APPearance HAZY (*)    Hgb urine dipstick SMALL (*)    Ketones, ur 5 (*)    Leukocytes,Ua MODERATE (*)    Bacteria, UA RARE (*)    All other components within normal limits  PROTIME-INR - Abnormal; Notable for the following components:   Prothrombin Time 18.8 (*)    INR 1.6 (*)    All other components within normal limits  CBC WITH DIFFERENTIAL/PLATELET    EKG None  Radiology No results found.  Procedures Procedures    Medications Ordered in ED Medications - No data to display  ED Course/ Medical Decision Making/ A&P Clinical Course as of 12/16/22 1353  Sun Dec 16, 2022  1350 Basic metabolic panel(!) No significant metabolic derangements.  Normal kidney function. [TY]  1350 CBC with Differential No leukocytosis to suggest infection.  Does not appear to have acute blood loss anemia. [TY]  1350 Protime-INR(!) She is not supratherapeutic on her warfarin [TY]  1350 Urinalysis, Routine w reflex microscopic -Urine, Clean Catch(!) Evidence of UTI. Will give antibiotics  [TY]  1351 Patient's workup today largely reassuring.  Suspect hematuria secondary to UTI.  Will discharge on antibiotics.  Follow-up with primary doctor.  Return precautions given. [TY]    Clinical Course User Index [TY] Coral Spikes, DO                             Medical Decision Making This is a 77 year old female presenting emergency department for UTI.  Afebrile nontachycardic normotensive.  Physical exam reassuring has small sacral decubitus does not appear to be infected.  No other overt source of external bacterial infection.  Patient reportedly at her baseline mentation per husband and son.  Further history provided by son Williette Loewe who I called who stated that home health called him and told him that patient had blood in her urine.  Per chart review it appears that patient is on warfarin for her  chronic atrial fibrillation.  Will get basic screening labs, coags and urine.  Son also states that patient was diagnosed with a UTI 2 weeks ago, but has not had any antibiotics.  Patient overall well-appearing; anticipate discharge.  See ED course for further MDM and final disposition.  Amount and/or Complexity of Data Reviewed Labs: ordered. Decision-making details documented in ED Course. Radiology:     Details: Considered imaging, however patient with essentially asymptomatic hematuria.  CT scan unlikely to change management or disposition.  Risk Prescription drug management.        Final Clinical Impression(s) / ED Diagnoses Final diagnoses:  Pressure injury of sacral region, stage 2 (HCC)  Acute cystitis with hematuria    Rx / DC Orders ED Discharge Orders          Ordered    cephALEXin (KEFLEX) 500 MG capsule  2 times daily        12/16/22 1352              Coral Spikes, DO 12/16/22 1353

## 2022-12-16 NOTE — ED Notes (Signed)
Pts son updated about discharge paperwork and to pick up prescription. Pt discharged home with [facility].

## 2022-12-16 NOTE — Discharge Instructions (Addendum)
You are seen in the emergency department for your blood in urine.  Your vital signs, physical exam and lab work were all reassuring.  It appears you had a urinary tract infection.  We are prescribing antibiotics.  Please take them as prescribed for the full course.  Please follow-up with your primary doctor.  Turn if develop fevers, chills, abdominal pain, nausea, chest pain, shortness of breath, vomiting or any new or worsening symptoms that are concerning to you.

## 2023-01-17 ENCOUNTER — Encounter (HOSPITAL_COMMUNITY): Payer: Self-pay

## 2023-01-17 ENCOUNTER — Emergency Department (HOSPITAL_COMMUNITY): Payer: Medicare Other

## 2023-01-17 ENCOUNTER — Emergency Department (HOSPITAL_COMMUNITY)
Admission: EM | Admit: 2023-01-17 | Discharge: 2023-01-18 | Disposition: A | Discharge status: Home / Self Care | Payer: Medicare Other | Attending: Emergency Medicine | Admitting: Emergency Medicine

## 2023-01-17 ENCOUNTER — Other Ambulatory Visit: Payer: Self-pay

## 2023-01-17 DIAGNOSIS — G20C Parkinsonism, unspecified: Secondary | ICD-10-CM | POA: Diagnosis not present

## 2023-01-17 DIAGNOSIS — R300 Dysuria: Secondary | ICD-10-CM | POA: Diagnosis not present

## 2023-01-17 DIAGNOSIS — R5383 Other fatigue: Secondary | ICD-10-CM | POA: Diagnosis not present

## 2023-01-17 DIAGNOSIS — I509 Heart failure, unspecified: Secondary | ICD-10-CM | POA: Insufficient documentation

## 2023-01-17 DIAGNOSIS — R5381 Other malaise: Secondary | ICD-10-CM | POA: Diagnosis not present

## 2023-01-17 DIAGNOSIS — R531 Weakness: Secondary | ICD-10-CM

## 2023-01-17 DIAGNOSIS — N3001 Acute cystitis with hematuria: Secondary | ICD-10-CM

## 2023-01-17 LAB — CBC WITH DIFFERENTIAL/PLATELET
Abs Immature Granulocytes: 0.04 10*3/uL (ref 0.00–0.07)
Basophils Absolute: 0.1 10*3/uL (ref 0.0–0.1)
Basophils Relative: 1 %
Eosinophils Absolute: 0.2 10*3/uL (ref 0.0–0.5)
Eosinophils Relative: 3 %
HCT: 41.1 % (ref 36.0–46.0)
Hemoglobin: 13.3 g/dL (ref 12.0–15.0)
Immature Granulocytes: 0 %
Lymphocytes Relative: 26 %
Lymphs Abs: 2.5 10*3/uL (ref 0.7–4.0)
MCH: 29.5 pg (ref 26.0–34.0)
MCHC: 32.4 g/dL (ref 30.0–36.0)
MCV: 91.1 fL (ref 80.0–100.0)
Monocytes Absolute: 0.6 10*3/uL (ref 0.1–1.0)
Monocytes Relative: 7 %
Neutro Abs: 6 10*3/uL (ref 1.7–7.7)
Neutrophils Relative %: 63 %
Platelets: 253 10*3/uL (ref 150–400)
RBC: 4.51 MIL/uL (ref 3.87–5.11)
RDW: 13.4 % (ref 11.5–15.5)
WBC: 9.5 10*3/uL (ref 4.0–10.5)
nRBC: 0 % (ref 0.0–0.2)

## 2023-01-17 LAB — URINALYSIS, W/ REFLEX TO CULTURE (INFECTION SUSPECTED)
Bilirubin Urine: NEGATIVE
Glucose, UA: NEGATIVE mg/dL
Ketones, ur: 5 mg/dL — AB
Nitrite: POSITIVE — AB
Protein, ur: 30 mg/dL — AB
Specific Gravity, Urine: 1.016 (ref 1.005–1.030)
WBC, UA: >50 WBC/hpf (ref 0–5)
pH: 5 (ref 5.0–8.0)

## 2023-01-17 LAB — BASIC METABOLIC PANEL
Anion gap: 10 (ref 5–15)
BUN: 15 mg/dL (ref 8–23)
CO2: 26 mmol/L (ref 22–32)
Calcium: 9.2 mg/dL (ref 8.9–10.3)
Chloride: 104 mmol/L (ref 98–111)
Creatinine, Ser: 0.67 mg/dL (ref 0.44–1.00)
GFR, Estimated: >60 mL/min (ref >60)
Glucose, Bld: 162 mg/dL — ABNORMAL HIGH (ref 70–99)
Potassium: 3.6 mmol/L (ref 3.5–5.1)
Sodium: 140 mmol/L (ref 135–145)

## 2023-01-17 LAB — LIPASE, BLOOD: Lipase: 22 U/L (ref 11–51)

## 2023-01-17 MED ORDER — LACTATED RINGERS IV BOLUS
1000.0000 mL | Freq: Once | INTRAVENOUS | Status: AC
  Administered 2023-01-17: 1000 mL via INTRAVENOUS

## 2023-01-17 NOTE — ED Provider Notes (Signed)
[place] EMERGENCY DEPARTMENT AT [facility] Provider Note   CSN: [id number] Arrival date & time: 01/17/23  1832     History Chief Complaint  Patient presents with   Fatigue    HPI Christy Pham is a 77 y.o. female presenting for generalized malaise. 77 year old female extensive medical history including Parkinson's, recent UTI , Heart failure, A-fib RVR, treated with Keflex with symptoms never improving.  She is coming in today with weakness fatigue continued dysuria malaise feelings of dehydration.  Patient's recorded medical, surgical, social, medication list and allergies were reviewed in the Snapshot window as part of the initial history.   Review of Systems   Review of Systems  Constitutional:  Negative for chills and fever.  HENT:  Negative for ear pain and sore throat.   Eyes:  Negative for pain and visual disturbance.  Respiratory:  Negative for cough and shortness of breath.   Cardiovascular:  Negative for chest pain and palpitations.  Gastrointestinal:  Positive for nausea. Negative for abdominal pain and vomiting.  Genitourinary:  Positive for dysuria and frequency. Negative for hematuria.  Musculoskeletal:  Negative for arthralgias and back pain.  Skin:  Negative for color change and rash.  Neurological:  Negative for seizures and syncope.  All other systems reviewed and are negative.   Physical Exam Updated Vital Signs BP (!) 147/76 (BP Location: Right Arm)   Pulse 83   Temp 98.6 F (37 C) (Oral)   Resp 16   Ht 5\' 4"  (1.626 m)   Wt 82 kg   SpO2 98%   BMI 31.03 kg/m  Physical Exam Vitals and nursing note reviewed.  Constitutional:      General: She is not in acute distress.    Appearance: She is well-developed.  HENT:     Head: Normocephalic and atraumatic.  Eyes:     Conjunctiva/sclera: Conjunctivae normal.  Cardiovascular:     Rate and Rhythm: Normal rate and regular rhythm.     Heart sounds: No murmur heard. Pulmonary:      Effort: Pulmonary effort is normal. No respiratory distress.     Breath sounds: Normal breath sounds.  Abdominal:     General: There is no distension.     Palpations: Abdomen is soft.     Tenderness: There is no abdominal tenderness. There is no right CVA tenderness or left CVA tenderness.  Musculoskeletal:        General: No swelling or tenderness. Normal range of motion.     Cervical back: Neck supple.  Skin:    General: Skin is warm and dry.  Neurological:     General: No focal deficit present.     Mental Status: She is alert and oriented to person, place, and time. Mental status is at baseline.     Cranial Nerves: No cranial nerve deficit.      ED Course/ Medical Decision Making/ A&P    Procedures Procedures   Medications Ordered in ED Medications  lactated ringers bolus 1,000 mL (0 mLs Intravenous Stopped 01/17/23 2325)   Medical Decision Making:   Christy Pham is a 77 y.o. female who presented to the ED today with altered mental status detailed above.    Additional history discussed with patient's family/caregivers.  Patient placed on continuous vitals and telemetry monitoring while in ED which was reviewed periodically.  Complete initial physical exam performed, notably the patient  was in no acute distress.    Reviewed and confirmed nursing documentation for past medical  history, family history, social history.    Initial Assessment:   With the patient's presentation of altered mental status, most likely diagnosis is delerium 2/2 infectious etiology (UTI/CAP/URI) vs metabolic abnormality (Na/K/Mg/Ca) vs nonspecific etiology. Other diagnoses were considered including (but not limited to) CVA, ICH, intracranial mass, critical dehydration, heptatic dysfunction, uremia, hypercarbia, intoxication, endrocrine abnormality, toxidrome. These are considered less likely due to history of present illness and physical exam findings.   This is most consistent with an acute life/limb  threatening illness complicated by underlying chronic conditions.  Initial Plan:  Screening labs including CBC and Metabolic panel to evaluate for infectious or metabolic etiology of disease.  Urinalysis with reflex culture ordered to evaluate for UTI or relevant urologic/nephrologic pathology.  CXR to evaluate for structural/infectious intrathoracic pathology.  EKG to evaluate for cardiac pathology Objective evaluation as below reviewed   Initial Study Results:   Laboratory  All laboratory results reviewed without evidence of clinically relevant pathology.     EKG EKG was reviewed independently. Rate, rhythm, axis, intervals all examined and without medically relevant abnormality. ST segments without concerns for elevations.    Radiology:  All images reviewed independently. Agree with radiology report at this time.   DG Chest Portable 1 View  Result Date: 01/17/2023 CLINICAL DATA:  Shortness of breath EXAM: PORTABLE CHEST 1 VIEW COMPARISON:  01/20/2022 FINDINGS: No acute airspace disease or effusion. Stable cardiomediastinal silhouette with aortic atherosclerosis. No pneumothorax. IMPRESSION: No active disease. Electronically Signed   By: Jasmine Pang M.D.   On: 01/17/2023 22:00    Final Assessment and Plan:   Pending urinalysis at this time.  Patient is fairly weak and has failed outpatient Keflex course per her PCP Care patient handed off to oncoming provider at this time pending urinalysis.  If positive, patient stated she would still like to go on outpatient therapy.  She would like to trial broad-spectrum antibiosis.  Would recommend IV ceftriaxone and p.o. Omnicef though this will be at the discretion of the oncoming provider.   Clinical Impression:  1. Generalized weakness      Data Unavailable   Final Clinical Impression(s) / ED Diagnoses Final diagnoses:  Generalized weakness    Rx / DC Orders ED Discharge Orders     None         Glyn Ade,  MD 01/17/23 2344

## 2023-01-17 NOTE — ED Triage Notes (Addendum)
Patient reports feeling unwell x 1 day. Patient denies headache, fever, chest pain, abdominal pain, nausea, vomiting, diarrhea. Reports she is unable to describe it.  Patient is bed bound at baseline and has Parkinson's

## 2023-01-17 NOTE — ED Notes (Signed)
In and out cath attempted x 2, patient had a soaked adult brief on when attempted, no urine obtained. Pur wick put in place. Will attempt cath again in about 30 minutes. JRPRN

## 2023-01-18 DIAGNOSIS — R531 Weakness: Secondary | ICD-10-CM | POA: Diagnosis not present

## 2023-01-18 MED ORDER — CEFDINIR 300 MG PO CAPS: 300.0000 mg | ORAL_CAPSULE | Freq: Two times a day (BID) | ORAL | 0 refills | Status: AC

## 2023-01-18 MED ORDER — SODIUM CHLORIDE 0.9 % IV SOLN
2.0000 g | Freq: Once | INTRAVENOUS | Status: AC
  Administered 2023-01-18: 2 g via INTRAVENOUS
  Filled 2023-01-18: qty 20

## 2023-01-18 NOTE — ED Notes (Signed)
Pt discharged home by [facility]. Discharge information discussed. No s/s of distress observed during discharge

## 2023-01-18 NOTE — ED Notes (Signed)
Called [facility] for transport back home. Awaiting [facility] to take patient home. JRPRN

## 2023-01-18 NOTE — ED Notes (Signed)
Attempted to call both the husbands cell phone number and the home number. No answer.

## 2023-01-18 NOTE — ED Provider Notes (Signed)
  Physical Exam  BP (!) 136/54   Pulse 68   Temp 98.6 F (37 C) (Oral)   Resp (!) 21   Ht 5\' 4"  (1.626 m)   Wt 82 kg   SpO2 98%   BMI 31.03 kg/m   Physical Exam Vitals and nursing note reviewed.  Constitutional:      General: She is not in acute distress.    Appearance: She is well-developed.  HENT:     Head: Normocephalic and atraumatic.  Eyes:     Conjunctiva/sclera: Conjunctivae normal.  Cardiovascular:     Rate and Rhythm: Normal rate and regular rhythm.     Heart sounds: No murmur heard. Pulmonary:     Effort: Pulmonary effort is normal. No respiratory distress.     Breath sounds: Normal breath sounds.  Abdominal:     Palpations: Abdomen is soft.     Tenderness: There is no abdominal tenderness.  Musculoskeletal:        General: No swelling.     Cervical back: Neck supple.  Skin:    General: Skin is warm and dry.     Capillary Refill: Capillary refill takes less than 2 seconds.  Neurological:     Mental Status: She is alert.  Psychiatric:        Mood and Affect: Mood normal.     Procedures  Procedures  ED Course / MDM    Medical Decision Making Amount and/or Complexity of Data Reviewed Labs: ordered. Radiology: ordered.  Risk Prescription drug management.   Patient received in handoff.  Recurrent UTIs pending UA.  Has failed Keflex and we did attempt to admit patient, but she is requesting to avoid hospitalization today and instead trial a different oral antibiotic if urinalysis is concerning for UTI.  Urinalysis is concerning for infection but we will send a culture.  Will escalate antibiotic therapy to [facility].  Received single dose ceftriaxone and discharged with Mellody Memos, Wyn Forster, MD 01/18/23 [phone]

## 2023-01-18 NOTE — ED Notes (Signed)
Attempt to call husband w/ no answer

## 2023-04-20 ENCOUNTER — Encounter (HOSPITAL_COMMUNITY): Payer: Self-pay

## 2023-04-20 ENCOUNTER — Other Ambulatory Visit: Payer: Self-pay

## 2023-04-20 ENCOUNTER — Emergency Department (HOSPITAL_COMMUNITY)
Admission: EM | Admit: 2023-04-20 | Discharge: 2023-04-20 | Disposition: A | Discharge status: Home / Self Care | Payer: Medicare Other | Attending: Emergency Medicine | Admitting: Emergency Medicine

## 2023-04-20 DIAGNOSIS — Z7901 Long term (current) use of anticoagulants: Secondary | ICD-10-CM | POA: Diagnosis not present

## 2023-04-20 DIAGNOSIS — E119 Type 2 diabetes mellitus without complications: Secondary | ICD-10-CM | POA: Insufficient documentation

## 2023-04-20 DIAGNOSIS — K429 Umbilical hernia without obstruction or gangrene: Secondary | ICD-10-CM | POA: Diagnosis not present

## 2023-04-20 DIAGNOSIS — Z7984 Long term (current) use of oral hypoglycemic drugs: Secondary | ICD-10-CM | POA: Insufficient documentation

## 2023-04-20 DIAGNOSIS — R1909 Other intra-abdominal and pelvic swelling, mass and lump: Secondary | ICD-10-CM | POA: Diagnosis present

## 2023-04-20 DIAGNOSIS — Z79899 Other long term (current) drug therapy: Secondary | ICD-10-CM | POA: Insufficient documentation

## 2023-04-20 DIAGNOSIS — I10 Essential (primary) hypertension: Secondary | ICD-10-CM | POA: Insufficient documentation

## 2023-04-20 LAB — URINALYSIS, ROUTINE W REFLEX MICROSCOPIC
Bilirubin Urine: NEGATIVE
Glucose, UA: NEGATIVE mg/dL
Ketones, ur: NEGATIVE mg/dL
Leukocytes,Ua: NEGATIVE
Nitrite: NEGATIVE
Protein, ur: 30 mg/dL — AB
Specific Gravity, Urine: 1.015 (ref 1.005–1.030)
pH: 8 (ref 5.0–8.0)

## 2023-04-20 NOTE — ED Triage Notes (Signed)
Pt arrived with ambulance. Home health aide sent patient over because she says she felt a mass on her abdomen. Patient at baseline mentation. Son at bedside. Also states patient has hallucinations which are normal for her. EMS reports patient was brady at 40 in route, unsure of baseline. NAD.  Patient has no complaints

## 2023-04-20 NOTE — Discharge Instructions (Signed)
You have been evaluated for your symptoms.  The abdominal mass is an umbilical hernia that is easily reducible with steady pressure.  You may follow-up with the surgeon for outpatient evaluation and management if necessary.

## 2023-04-20 NOTE — ED Notes (Signed)
[facility] called, son at bedside

## 2023-04-20 NOTE — ED Provider Notes (Signed)
[facility] Provider Note   CSN: [id number] Arrival date & time: 04/20/23  1023     History  No chief complaint on file.   Christy Pham is a 77 y.o. female.  The history is provided by the patient, a relative and medical records. No language interpreter was used.     77 year old female significant history of atrial fibrillation currently on Coumadin, Parkinson disease, hypertension, diabetes brought here via ems from home for evaluation of abdominal mass.  History obtained through patient and through son who is at bedside.  Per son, patient's caregiver came by to see her today and noticed an abdominal mass and afterward EMS was called to bring her here.  Son however states patient has not had any specific complaint she is eating and drinking fine and she is bedbound.  No report of any vomiting no fever no pain.  I was able to communicate with patient, patient without any complaint she does not endorse any abdominal pain or chest pain or trouble eating or drinking.  Son felt patient is at her baseline mentation.  Home Medications Prior to Admission medications   Medication Sig Start Date End Date Taking? Authorizing Provider  carbidopa-levodopa (SINEMET IR) 25-100 MG tablet Take 2 tablets by mouth 2 (two) times daily. 01/09/22   [provider]  carbidopa-levodopa-entacapone (STALEVO) 31.25-125-200 MG tablet 2 tablets at 7 am, 1 at 11 am, 2 at 4 pm 02/13/18   Tat, Octaviano Batty, DO  digoxin (LANOXIN) 0.125 MG tablet Take 1 tablet (0.125 mg total) by mouth daily. 10/10/21   Quintella Reichert, MD  FLUoxetine (PROZAC) 20 MG capsule Take 20 mg by mouth daily. 12/10/19   [provider]  metFORMIN (GLUCOPHAGE-XR) 500 MG 24 hr tablet Take 500 mg by mouth daily.  06/17/17   [provider]  metoprolol tartrate (LOPRESSOR) 25 MG tablet Take 0.5 tablets (12.5 mg total) by mouth 2 (two) times daily. 05/19/14   Elgergawy, Leana Roe, MD   Multiple Vitamin (MULTIVITAMIN) tablet Take 1 tablet by mouth daily.    [provider]  Multiple Vitamins-Minerals (EYE VITAMINS PO) Take 2 tablets by mouth daily.     [provider]  OLANZapine (ZYPREXA) 2.5 MG tablet Take 2.5 mg by mouth at bedtime.    [provider]  rosuvastatin (CRESTOR) 20 MG tablet Take 20 mg by mouth at bedtime. 12/31/19   [provider]  warfarin (COUMADIN) 7.5 MG tablet Take 7.5 mg by mouth daily. 12/01/19   [provider]      Allergies    Patient has no known allergies.    Review of Systems   Review of Systems  All other systems reviewed and are negative.   Physical Exam Updated Vital Signs BP 130/69   Pulse (!) 51   Temp 97.7 F (36.5 C) (Rectal)   Resp 17   Ht 5\' 4"  (1.626 m)   Wt 82 kg   SpO2 97%   BMI 31.03 kg/m  Physical Exam Vitals and nursing note reviewed.  Constitutional:      General: She is not in acute distress.    Appearance: She is well-developed. She is obese.     Comments: Chronically ill-appearing female laying in bed appears to be in no acute discomfort.  HENT:     Head: Normocephalic and atraumatic.  Eyes:     Conjunctiva/sclera: Conjunctivae normal.  Cardiovascular:     Rate and Rhythm: Rhythm irregular.  Pulses: Normal pulses.     Heart sounds: Normal heart sounds.  Pulmonary:     Effort: Pulmonary effort is normal.     Breath sounds: No wheezing, rhonchi or rales.  Chest:     Chest wall: No tenderness.  Abdominal:     Palpations: Abdomen is soft.     Tenderness: There is no abdominal tenderness.     Hernia: A hernia (Umbilical hernia noted easily reducible with steady hand pressure) is present.  Musculoskeletal:     Cervical back: Neck supple.  Skin:    Findings: No rash.  Neurological:     Mental Status: She is alert.     Comments: Chronic left sided facial droop  Psychiatric:        Mood and Affect: Mood normal.     ED Results / Procedures / Treatments    Labs (all labs ordered are listed, but only abnormal results are displayed) Labs Reviewed  URINALYSIS, ROUTINE W REFLEX MICROSCOPIC - Abnormal; Notable for the following components:      Result Value   Color, Urine AMBER (*)    APPearance CLOUDY (*)    Hgb urine dipstick SMALL (*)    Protein, ur 30 (*)    Bacteria, UA FEW (*)    All other components within normal limits    EKG None  Radiology No results found.  Procedures Hernia reduction  Date/Time: 04/20/2023 11:32 AM  Performed by: Fayrene Helper, PA-C Authorized by: Fayrene Helper, PA-C  Consent: Verbal consent obtained. Risks and benefits: risks, benefits and alternatives were discussed Consent given by: patient and guardian Patient understanding: patient states understanding of the procedure being performed Patient consent: the patient's understanding of the procedure matches consent given Local anesthesia used: no  Anesthesia: Local anesthesia used: no  Sedation: Patient sedated: no  Patient tolerance: patient tolerated the procedure well with no immediate complications Comments: Patient has an umbilical hernia that was easily reducible by me using steady hand pressure for approximately 2 minutes.  Patient tolerates well.  No overlying skin changes.       Medications Ordered in ED Medications - No data to display  ED Course/ Medical Decision Making/ A&P                                 Medical Decision Making Amount and/or Complexity of Data Reviewed Labs: ordered.   Ht 5\' 4"  (1.626 m)   Wt 82 kg   BMI 31.03 kg/m   34:28 AM  77 year old female significant history of atrial fibrillation currently on Coumadin, Parkinson disease, hypertension, diabetes brought here via ems from home for evaluation of abdominal mass.  History obtained through patient and through son who is at bedside.  Per son, patient's caregiver came by to see her today and noticed an abdominal mass and afterward EMS was called to bring  her here.  Son however states patient has not had any specific complaint she is eating and drinking fine and she is bedbound.  No report of any vomiting no fever no pain.  I was able to communicate with patient, patient without any complaint she does not endorse any abdominal pain or chest pain or trouble eating or drinking.  Son felt patient is at her baseline mentation.  Exam remarkable for an umbilical hernia easily reducible by me.  Patient tolerates well.  No other concerning masses or skin changes on my exam.  Patient is at  baseline mentation.  She is bedbound chronically.  At this time I gave reassurance and patient overall stable for discharge.  Urinalysis was obtained not by me however it did show cloudy appearance along with 21-50 WBC and nitrite negative.  Since patient is without any urinary complaint and she is wearing adult diaper I suspect this is likely a colonization and less likely to be a UTI as her primary complaint was abdominal mass.  Will hold off on antibiotic at this time.  I discussed care with Dr. Theresia Lo who has seen evaluate patient and agrees with plan.        Final Clinical Impression(s) / ED Diagnoses Final diagnoses:  Umbilical hernia without obstruction and without gangrene    Rx / DC Orders ED Discharge Orders     None         Fayrene Helper, PA-C 04/20/23 1253    Theresia Lo [place] K, DO 04/20/23 1545
